# Patient Record
Sex: Male | Born: 1935 | Race: White | Hispanic: No | Marital: Married | Smoking: Former smoker
Health system: Southern US, Community
[De-identification: ages and names within clinical notes are randomized; demographics above are authoritative.]

## PROBLEM LIST (undated history)

## (undated) DIAGNOSIS — K219 Gastro-esophageal reflux disease without esophagitis: Secondary | ICD-10-CM

## (undated) DIAGNOSIS — E119 Type 2 diabetes mellitus without complications: Secondary | ICD-10-CM

## (undated) DIAGNOSIS — C3490 Malignant neoplasm of unspecified part of unspecified bronchus or lung: Secondary | ICD-10-CM

## (undated) DIAGNOSIS — I10 Essential (primary) hypertension: Secondary | ICD-10-CM

## (undated) DIAGNOSIS — C3491 Malignant neoplasm of unspecified part of right bronchus or lung: Principal | ICD-10-CM

## (undated) DIAGNOSIS — M4000 Postural kyphosis, site unspecified: Secondary | ICD-10-CM

## (undated) DIAGNOSIS — Z789 Other specified health status: Secondary | ICD-10-CM

## (undated) HISTORY — DX: Postural kyphosis, site unspecified: M40.00

## (undated) HISTORY — DX: Type 2 diabetes mellitus without complications: E11.9

## (undated) HISTORY — DX: Gastro-esophageal reflux disease without esophagitis: K21.9

## (undated) HISTORY — DX: Essential (primary) hypertension: I10

## (undated) HISTORY — DX: Malignant neoplasm of unspecified part of right bronchus or lung: C34.91

---

## 1997-12-26 ENCOUNTER — Other Ambulatory Visit: Admission: RE | Admit: 1997-12-26 | Discharge: 1997-12-26 | Payer: Self-pay | Admitting: *Deleted

## 1998-01-01 ENCOUNTER — Ambulatory Visit (HOSPITAL_COMMUNITY): Admission: RE | Admit: 1998-01-01 | Discharge: 1998-01-01 | Payer: Self-pay | Admitting: *Deleted

## 1998-01-21 ENCOUNTER — Ambulatory Visit (HOSPITAL_BASED_OUTPATIENT_CLINIC_OR_DEPARTMENT_OTHER): Admission: RE | Admit: 1998-01-21 | Discharge: 1998-01-21 | Payer: Self-pay | Admitting: *Deleted

## 1998-02-18 ENCOUNTER — Ambulatory Visit (HOSPITAL_COMMUNITY): Admission: RE | Admit: 1998-02-18 | Discharge: 1998-02-18 | Payer: Self-pay | Admitting: Hematology and Oncology

## 1998-02-19 ENCOUNTER — Ambulatory Visit (HOSPITAL_COMMUNITY): Admission: RE | Admit: 1998-02-19 | Discharge: 1998-02-19 | Payer: Self-pay | Admitting: Hematology and Oncology

## 1998-02-25 ENCOUNTER — Ambulatory Visit (HOSPITAL_COMMUNITY): Admission: RE | Admit: 1998-02-25 | Discharge: 1998-02-25 | Payer: Self-pay | Admitting: Hematology and Oncology

## 1998-06-20 ENCOUNTER — Encounter: Payer: Self-pay | Admitting: Hematology and Oncology

## 1998-06-20 ENCOUNTER — Ambulatory Visit (HOSPITAL_COMMUNITY): Admission: RE | Admit: 1998-06-20 | Discharge: 1998-06-20 | Payer: Self-pay | Admitting: Hematology and Oncology

## 1998-09-12 ENCOUNTER — Ambulatory Visit (HOSPITAL_COMMUNITY): Admission: RE | Admit: 1998-09-12 | Discharge: 1998-09-12 | Payer: Self-pay | Admitting: Radiation Oncology

## 1999-03-13 ENCOUNTER — Encounter: Payer: Self-pay | Admitting: Hematology and Oncology

## 1999-03-13 ENCOUNTER — Ambulatory Visit (HOSPITAL_COMMUNITY): Admission: RE | Admit: 1999-03-13 | Discharge: 1999-03-13 | Payer: Self-pay | Admitting: Hematology and Oncology

## 1999-12-07 ENCOUNTER — Encounter: Payer: Self-pay | Admitting: *Deleted

## 1999-12-07 ENCOUNTER — Ambulatory Visit (HOSPITAL_COMMUNITY): Admission: RE | Admit: 1999-12-07 | Discharge: 1999-12-07 | Payer: Self-pay | Admitting: *Deleted

## 2000-09-23 ENCOUNTER — Ambulatory Visit (HOSPITAL_COMMUNITY): Admission: RE | Admit: 2000-09-23 | Discharge: 2000-09-23 | Payer: Self-pay | Admitting: Gastroenterology

## 2001-02-09 ENCOUNTER — Ambulatory Visit (HOSPITAL_COMMUNITY): Admission: RE | Admit: 2001-02-09 | Discharge: 2001-02-09 | Payer: Self-pay | Admitting: *Deleted

## 2001-02-09 ENCOUNTER — Encounter: Payer: Self-pay | Admitting: *Deleted

## 2001-06-21 ENCOUNTER — Encounter: Payer: Self-pay | Admitting: *Deleted

## 2001-06-21 ENCOUNTER — Ambulatory Visit (HOSPITAL_COMMUNITY): Admission: RE | Admit: 2001-06-21 | Discharge: 2001-06-21 | Payer: Self-pay | Admitting: *Deleted

## 2001-06-22 ENCOUNTER — Ambulatory Visit (HOSPITAL_COMMUNITY): Admission: RE | Admit: 2001-06-22 | Discharge: 2001-06-22 | Payer: Self-pay | Admitting: *Deleted

## 2001-06-22 ENCOUNTER — Encounter: Payer: Self-pay | Admitting: *Deleted

## 2001-11-06 ENCOUNTER — Encounter: Payer: Self-pay | Admitting: *Deleted

## 2001-11-06 ENCOUNTER — Ambulatory Visit (HOSPITAL_COMMUNITY): Admission: RE | Admit: 2001-11-06 | Discharge: 2001-11-06 | Payer: Self-pay | Admitting: *Deleted

## 2001-11-06 ENCOUNTER — Encounter (INDEPENDENT_AMBULATORY_CARE_PROVIDER_SITE_OTHER): Payer: Self-pay

## 2002-08-22 ENCOUNTER — Encounter: Payer: Self-pay | Admitting: *Deleted

## 2002-08-22 ENCOUNTER — Ambulatory Visit (HOSPITAL_COMMUNITY): Admission: RE | Admit: 2002-08-22 | Discharge: 2002-08-22 | Payer: Self-pay | Admitting: *Deleted

## 2007-06-12 ENCOUNTER — Ambulatory Visit: Payer: Self-pay | Admitting: Family Medicine

## 2008-04-03 ENCOUNTER — Ambulatory Visit: Payer: Self-pay | Admitting: Family Medicine

## 2010-11-27 NOTE — Procedures (Signed)
[place]  Patient:    Alexander Landry, Alexander Landry                   MRN: [id number] Proc. Date: 09/23/00 Adm. Date:  [id number] Attending:  Nelda Marseille CC:         Ronnald Nian, M.D.   Procedure Report  PROCEDURE:  Colonoscopy and polypectomy.  INDICATION:  Patient overdue for colonic screening.  Consent was signed after risks, benefits, methods, and options thoroughly discussed in the office.  MEDICATIONS:  Demerol 50 mg, Versed 8 mg.  DESCRIPTION OF PROCEDURE:  Rectal inspection was pertinent for external hemorrhoids.  Digital exam was negative.  The colonoscope was inserted, and he had an extremely difficult sigmoid turn, which to advance around required rolling him on his back and some abdominal pressures.  Once past that area, we were easily able to advance to the cecum.  On insertion, no significant abnormalities were seen.  The cecum was identified by the appendiceal orifice and the ileocecal valve.  There was a 4 mm erythematous polyp in the cecum, which was carefully snagged with electrocautery at a setting of 20-20, carefully applied, and the polyp was removed, sucked through the endoscope, and collected in the trap.  There was no active bleeding and nice, white coagulum in the cecum.  The scope was then slowly withdrawn.  The prep was adequate.  There was some liquid stool that required washing and suctioning. On slow withdrawal through the colon, the ascending and transverse and descending were normal.  The sigmoid was tortuous and when we withdrew through the tortuous area, he had some discomfort.  Back distal to that in the distal sigmoid and rectum, three tiny small polyps were seen and were hot biopsied x 1 or 2.  The scope was retroflexed, pertinent for some internal hemorrhoids. The scope was straightened, air withdrawn, and the scope removed.  The patient tolerated the procedure adequately except for when we advanced  around the sigmoid.  There was no obvious immediate complication.  ENDOSCOPIC DIAGNOSES: 1. Internal-external hemorrhoids. 2. Tortuous sigmoid. 3. Three rectal and distal sigmoid polyps, status post hot biopsy. 4. Cecal 4 mm erythematous polyp, status post snare. 5. Otherwise within normal limits to the cecum.  PLAN:  Await pathology to determine future colonic screening, standard postpolypectomy instructions.  GI follow-up p.r.n., otherwise return care to Dr. Susann Givens for the customary health maintenance to include yearly rectals and guaiacs. DD:  09/23/00 TD:  09/23/00 Job: [id number]

## 2013-03-29 ENCOUNTER — Encounter: Payer: Self-pay | Admitting: Family Medicine

## 2013-03-29 ENCOUNTER — Ambulatory Visit (INDEPENDENT_AMBULATORY_CARE_PROVIDER_SITE_OTHER): Payer: Medicare Other | Admitting: Family Medicine

## 2013-03-29 VITALS — BP 120/70 | HR 68 | Ht 72.0 in | Wt 220.0 lb

## 2013-03-29 DIAGNOSIS — I10 Essential (primary) hypertension: Secondary | ICD-10-CM

## 2013-03-29 DIAGNOSIS — Z79899 Other long term (current) drug therapy: Secondary | ICD-10-CM

## 2013-03-29 DIAGNOSIS — Z87442 Personal history of urinary calculi: Secondary | ICD-10-CM | POA: Insufficient documentation

## 2013-03-29 DIAGNOSIS — Z8571 Personal history of Hodgkin lymphoma: Secondary | ICD-10-CM

## 2013-03-29 DIAGNOSIS — E118 Type 2 diabetes mellitus with unspecified complications: Secondary | ICD-10-CM | POA: Insufficient documentation

## 2013-03-29 DIAGNOSIS — E119 Type 2 diabetes mellitus without complications: Secondary | ICD-10-CM

## 2013-03-29 DIAGNOSIS — E1169 Type 2 diabetes mellitus with other specified complication: Secondary | ICD-10-CM

## 2013-03-29 DIAGNOSIS — Z23 Encounter for immunization: Secondary | ICD-10-CM

## 2013-03-29 DIAGNOSIS — E1159 Type 2 diabetes mellitus with other circulatory complications: Secondary | ICD-10-CM | POA: Insufficient documentation

## 2013-03-29 DIAGNOSIS — E785 Hyperlipidemia, unspecified: Secondary | ICD-10-CM | POA: Insufficient documentation

## 2013-03-29 NOTE — Patient Instructions (Signed)
Stop the Glucotrol

## 2013-03-29 NOTE — Progress Notes (Signed)
  Subjective:    Patient ID: Alexander Landry, male    DOB: 11/28/1932, 79 y.o.   MRN: [id number]  HPI He is here to get reestablished with my practice. He was here several years ago. He has a previous history of Hodgkin's lymphoma but is now several years past this. He does have underlying diabetes, hypertension, hyperlipidemia. He was seeing a physician near his home town but switched back to me. He has no particular concerns or questions. Does have a previous history of renal stones as well. Review of Systems     Objective:   Physical Exam Alert and in no distress otherwise not examined. Hemoglobin A1c is 6.1.       Assessment & Plan:  Type II or unspecified type diabetes mellitus without mention of complication, not stated as uncontrolled  Hypertension associated with diabetes  Hyperlipidemia LDL goal < 70  History of hodgkin's lymphoma  History of renal stone  Need for prophylactic vaccination and inoculation against influenza - Plan: Flu Vaccine QUAD 36+ mos IM  Encounter for long-term (current) use of other medications    I will get his medical records and review them as well as the blood work. Did recommend that he cut out taking the Glucotrol. He has not had any hypoglycemic episodes however still like to stop this medication. He was also given a flu shot with risks and benefits discussed

## 2013-04-02 ENCOUNTER — Encounter: Payer: Self-pay | Admitting: Family Medicine

## 2013-05-02 ENCOUNTER — Telehealth: Payer: Self-pay | Admitting: Family Medicine

## 2013-05-02 ENCOUNTER — Other Ambulatory Visit: Payer: Self-pay

## 2013-05-02 MED ORDER — OMEPRAZOLE 20 MG PO CPDR: 20.0000 mg | DELAYED_RELEASE_CAPSULE | Freq: Every day | ORAL | Status: DC

## 2013-05-02 NOTE — Telephone Encounter (Signed)
SENT MED IN 

## 2013-05-02 NOTE — Telephone Encounter (Signed)
DONE

## 2013-06-28 ENCOUNTER — Ambulatory Visit (INDEPENDENT_AMBULATORY_CARE_PROVIDER_SITE_OTHER): Payer: Medicare Other | Admitting: Family Medicine

## 2013-06-28 ENCOUNTER — Encounter: Payer: Self-pay | Admitting: Family Medicine

## 2013-06-28 VITALS — BP 116/70 | HR 70 | Wt 220.0 lb

## 2013-06-28 DIAGNOSIS — E1169 Type 2 diabetes mellitus with other specified complication: Secondary | ICD-10-CM

## 2013-06-28 DIAGNOSIS — E1159 Type 2 diabetes mellitus with other circulatory complications: Secondary | ICD-10-CM

## 2013-06-28 DIAGNOSIS — E119 Type 2 diabetes mellitus without complications: Secondary | ICD-10-CM

## 2013-06-28 DIAGNOSIS — I10 Essential (primary) hypertension: Secondary | ICD-10-CM

## 2013-06-28 DIAGNOSIS — Z8571 Personal history of Hodgkin lymphoma: Secondary | ICD-10-CM

## 2013-06-28 DIAGNOSIS — E785 Hyperlipidemia, unspecified: Secondary | ICD-10-CM

## 2013-06-28 LAB — POCT GLYCOSYLATED HEMOGLOBIN (HGB A1C): Hemoglobin A1C: 6.6

## 2013-06-28 NOTE — Progress Notes (Signed)
  Subjective:    Alexander Landry is a 79 y.o. male who presents for follow-up of Type 2 diabetes mellitus.    Home blood sugar records: 2 TIMES A MTH  Current symptoms/problems include NO Daily foot checks: Any foot concerns NONE Last eye exam:  3/15   Medication compliance:good Current diet: NONE Current exercise: WALKING/GYM    Known diabetic complications: none Cardiovascular risk factors: advanced age (older than 34 for men, 102 for women), diabetes mellitus, dyslipidemia, hypertension and male gender   The following portions of the patient's history were reviewed and updated as appropriate: allergies, current medications, past family history, past medical history, past social history, past surgical history and problem list. He does have a previous history of Hodgkin's lymphoma. He also had hip fracture with subsequent pinning done earlier this year. ROS as in subjective above    Objective:    BP 116/70  Wt 220 lb (99.791 kg)  Filed Vitals:   06/28/13 1006  BP: 116/70    General appearence: alert, no distress, WD/WN   Lab Review No results found for this basename: HGBA1C   No results found for this basename: MICROALBUR, [id number]     Chemistry   No results found for this basename: NA, K, CL, CO2, BUN, CREATININE, GLU   No results found for this basename: CALCIUM, ALKPHOS, AST, ALT, BILITOT        Chemistry   No results found for this basename: NA, K, CL, CO2, BUN, CREATININE, GLU   No results found for this basename: CALCIUM, ALKPHOS, AST, ALT, BILITOT    His last blood work was done by his previous doctor in March of last year. Hemoglobin A1c is 6.6.    Assessment:   Encounter Diagnoses  Name Primary?  . Type II or unspecified type diabetes mellitus without mention of complication, not stated as uncontrolled Yes  . Hypertension associated with diabetes   . Hyperlipidemia LDL goal < 70   . History of hodgkin's lymphoma          Plan:    1.   Rx changes: none 2.  Education: Reviewed 'ABCs' of diabetes management (respective goals in parentheses):  A1C (<7), blood pressure (<130/80), and cholesterol (LDL <100). 3.  Compliance at present is estimated to be good. Efforts to improve compliance (if necessary) will be directed at none. 4. Follow up: 4 months  Overall he is doing quite well with his diabetes. Discussed the need for him to periodically check his blood sugars and if they start to become elevated, further intervention will be needed.

## 2013-07-10 ENCOUNTER — Telehealth: Payer: Self-pay | Admitting: Internal Medicine

## 2013-07-10 ENCOUNTER — Other Ambulatory Visit: Payer: Self-pay

## 2013-07-10 MED ORDER — PIOGLITAZONE HCL 15 MG PO TABS: 15.0000 mg | ORAL_TABLET | Freq: Every day | ORAL | Status: DC

## 2013-07-10 NOTE — Telephone Encounter (Signed)
DONE

## 2013-07-10 NOTE — Telephone Encounter (Signed)
SENT IN ACTOS PER TELEPHONE CALL

## 2013-07-10 NOTE — Telephone Encounter (Signed)
Refill request for actos 15mg  to [facility]

## 2013-07-13 ENCOUNTER — Telehealth: Payer: Self-pay | Admitting: Internal Medicine

## 2013-07-13 MED ORDER — SIMVASTATIN 20 MG PO TABS: 20.0000 mg | ORAL_TABLET | Freq: Every evening | ORAL | Status: DC

## 2013-07-13 NOTE — Telephone Encounter (Signed)
Refill request for zocor 20mg  #30 to [facility] [address]

## 2013-07-19 ENCOUNTER — Telehealth: Payer: Self-pay | Admitting: Internal Medicine

## 2013-07-19 MED ORDER — AMLODIPINE BESYLATE 10 MG PO TABS: 10.0000 mg | ORAL_TABLET | Freq: Every day | ORAL | Status: DC

## 2013-07-19 NOTE — Telephone Encounter (Signed)
Refill request for norvasc 10mg  to [facility] [address]

## 2013-07-24 ENCOUNTER — Telehealth: Payer: Self-pay | Admitting: Internal Medicine

## 2013-07-24 NOTE — Telephone Encounter (Signed)
Refill request for Glipizide ER 10mg  #60, lisinopril 40mg  to [facility] [address]

## 2013-07-26 ENCOUNTER — Telehealth: Payer: Self-pay | Admitting: Internal Medicine

## 2013-07-26 MED ORDER — GLIPIZIDE ER 10 MG PO TB24: 10.0000 mg | ORAL_TABLET | Freq: Two times a day (BID) | ORAL | Status: DC

## 2013-07-26 MED ORDER — LISINOPRIL 40 MG PO TABS: 40.0000 mg | ORAL_TABLET | Freq: Every day | ORAL | Status: DC

## 2013-07-26 MED ORDER — GLIPIZIDE ER 10 MG PO TB24: 10.0000 mg | ORAL_TABLET | Freq: Every day | ORAL | Status: DC

## 2013-07-26 NOTE — Telephone Encounter (Signed)
Rx sent to pharm. Pt aware

## 2013-07-26 NOTE — Telephone Encounter (Signed)
Pharmacy needs rx for glipizide for 2 times daily

## 2013-07-30 ENCOUNTER — Telehealth: Payer: Self-pay | Admitting: Family Medicine

## 2013-07-30 MED ORDER — PIOGLITAZONE HCL 30 MG PO TABS: 30.0000 mg | ORAL_TABLET | Freq: Every day | ORAL | Status: DC

## 2013-07-30 NOTE — Telephone Encounter (Signed)
No answer

## 2013-07-30 NOTE — Telephone Encounter (Signed)
Let him know that I called him to 30 mg which should be quite adequate

## 2013-07-30 NOTE — Telephone Encounter (Signed)
Patient will be switched to Actos 30

## 2013-07-31 NOTE — Telephone Encounter (Signed)
Spoke with pt wife

## 2013-08-02 ENCOUNTER — Telehealth: Payer: Self-pay | Admitting: Internal Medicine

## 2013-08-02 MED ORDER — BD SWAB SINGLE USE REGULAR PADS: MEDICATED_PAD | Status: DC

## 2013-08-02 MED ORDER — GLUCOSE BLOOD VI STRP: ORAL_STRIP | Status: DC

## 2013-08-02 MED ORDER — ACCU-CHEK SMARTVIEW CONTROL VI LIQD: Status: DC

## 2013-08-02 MED ORDER — ACCU-CHEK NANO SMARTVIEW W/DEVICE KIT: PACK | Status: DC

## 2013-08-02 MED ORDER — ACCU-CHEK SOFTCLIX LANCET DEV MISC: Status: DC

## 2013-08-02 NOTE — Telephone Encounter (Signed)
done

## 2013-08-02 NOTE — Telephone Encounter (Signed)
Rx sent into [facility]

## 2013-08-02 NOTE — Telephone Encounter (Signed)
Refill request from accu-chek fastclix lancets, accu-chek smartview control sol, BD single use swab, accu-chek nano smartview meter, omeprazole, accu-chek smartview test strips to [facility]

## 2013-08-10 ENCOUNTER — Ambulatory Visit (INDEPENDENT_AMBULATORY_CARE_PROVIDER_SITE_OTHER): Payer: Medicare HMO | Admitting: Medical

## 2013-08-10 ENCOUNTER — Encounter: Payer: Self-pay | Admitting: Medical

## 2013-08-10 ENCOUNTER — Other Ambulatory Visit: Payer: Self-pay | Admitting: Family Medicine

## 2013-08-10 VITALS — BP 120/80 | HR 56 | Temp 97.8°F | Resp 16 | Wt 221.0 lb

## 2013-08-10 DIAGNOSIS — E119 Type 2 diabetes mellitus without complications: Secondary | ICD-10-CM

## 2013-08-10 DIAGNOSIS — J069 Acute upper respiratory infection, unspecified: Secondary | ICD-10-CM

## 2013-08-10 MED ORDER — AMLODIPINE BESYLATE 10 MG PO TABS: 10.0000 mg | ORAL_TABLET | Freq: Every day | ORAL | Status: DC

## 2013-08-10 MED ORDER — ATENOLOL 100 MG PO TABS: 100.0000 mg | ORAL_TABLET | Freq: Every day | ORAL | Status: DC

## 2013-08-10 MED ORDER — PIOGLITAZONE HCL 30 MG PO TABS: 15.0000 mg | ORAL_TABLET | Freq: Every day | ORAL | Status: DC

## 2013-08-10 MED ORDER — SIMVASTATIN 20 MG PO TABS: 20.0000 mg | ORAL_TABLET | Freq: Every evening | ORAL | Status: DC

## 2013-08-10 MED ORDER — OMEPRAZOLE 20 MG PO CPDR: 20.0000 mg | DELAYED_RELEASE_CAPSULE | Freq: Every day | ORAL | Status: DC

## 2013-08-10 MED ORDER — AZITHROMYCIN 500 MG PO TABS: 500.0000 mg | ORAL_TABLET | Freq: Every day | ORAL | Status: DC

## 2013-08-10 MED ORDER — GLIPIZIDE ER 10 MG PO TB24: 10.0000 mg | ORAL_TABLET | Freq: Two times a day (BID) | ORAL | Status: DC

## 2013-08-10 NOTE — Patient Instructions (Signed)
Your symptoms and exam suggest a viral upper respiratory infection or cold.  I recommend you drink plenty of water, you can continue the DayQuil/NyQuil for the next few days, rest.  Limit the Afrin nasal spray to 4 days or less.  Usually takes about a week for cold to resolve  If much worse over the weekend with fever, consistent colored mucus, feeling way worse, then begin the a azithromycin antibiotic.  Followup with Dr. Redmond School day for your routine medication check

## 2013-08-10 NOTE — Progress Notes (Signed)
Subjective:  Alexander Landry is a 80 y.o. male who presents cough and congestion.  Here for congestion. He notes several days of cough, malaise, some head pressure.  Denies sore throat, fever, ear pain, no NVD, no sick contacts.  Using some Nyquil, and OTC Afrin.  No other aggravating or relieving factors.  No other c/o.  He is changing to a 90 day mail order pharmacy, needs a 90 day supply of all his medications  ROS as in subjective.  History reviewed. No pertinent past medical history.   Objective: Filed Vitals:   08/10/13 1144  BP: 120/80  Pulse: 56  Temp: 97.8 F (36.6 C)  Resp: 16    General appearance: Alert, WD/WN, no distress, mildly ill appearing                             Skin: warm, no rash                           Head: no sinus tenderness                            Eyes: conjunctiva normal, corneas clear, PERRLA                            Ears: pearly TMs, external ear canals normal                          Nose: septum midline, turbinates swollen, with erythema and clear discharge             Mouth/throat: MMM, tongue normal, mild pharyngeal erythema                           Neck: supple, no adenopathy, no thyromegaly, nontender                          Heart: RRR, normal S1, S2, no murmurs                         Lungs: CTA bilaterally, no wheezes, rales, or rhonchi     Assessment: Encounter Diagnoses  Name Primary?  Marland Kitchen Upper respiratory infection Yes  . Type II or unspecified type diabetes mellitus without mention of complication, not stated as uncontrolled     Plan: Discussed diagnosis and treatment of URI.  Suggested symptomatic OTC remedies.  Nasal saline spray for congestion.  Tylenol OTC for fever and malaise.  Call/return in 2-3 days if symptoms aren't resolving. Gave printed Zithromax script in the event of fever, worsening productive mucous, but advised that current picture is viral!  Sent 30 day supply on Atenolol and Prilosec, 90 day to [facility], and advised he f/u with Dr. Redmond School soon for routine diabetes f/u.

## 2013-09-24 ENCOUNTER — Telehealth: Payer: Self-pay | Admitting: Internal Medicine

## 2013-09-24 NOTE — Telephone Encounter (Signed)
I spoke with the patients wife and she states that they are unsure if he is suppose to be take the medication or not so they will just keep the bottles until he comes in for his appointment to see Dr. Redmond School in April and ask him. CLS

## 2013-09-24 NOTE — Telephone Encounter (Signed)
He normally sees Dr. Redmond School here.  His last visit because he was changing mail order pharmacies he asked for a refill, I refilled all active medications in chart, and I can't tell if amlodipine was ever stopped recently.  Thus, I assumed he is still taking it.  If he had not been taking it, please document this in chart, and f/u as planned with Dr. Redmond School as scheduled.

## 2013-09-24 NOTE — Telephone Encounter (Signed)
Pt wife called stating that they just received amlodipine in the mail from [facility] and pt states he is not on this med. Is he suppose to be taking this med?

## 2013-10-11 LAB — HM DIABETES EYE EXAM

## 2013-10-15 ENCOUNTER — Other Ambulatory Visit: Payer: Self-pay | Admitting: Medical

## 2013-10-15 ENCOUNTER — Encounter: Payer: Self-pay | Admitting: Internal Medicine

## 2013-10-30 ENCOUNTER — Ambulatory Visit (INDEPENDENT_AMBULATORY_CARE_PROVIDER_SITE_OTHER): Payer: Medicare HMO | Admitting: Family Medicine

## 2013-10-30 ENCOUNTER — Encounter: Payer: Self-pay | Admitting: Family Medicine

## 2013-10-30 VITALS — BP 130/70 | HR 72 | Wt 220.0 lb

## 2013-10-30 DIAGNOSIS — E1169 Type 2 diabetes mellitus with other specified complication: Secondary | ICD-10-CM

## 2013-10-30 DIAGNOSIS — E785 Hyperlipidemia, unspecified: Secondary | ICD-10-CM

## 2013-10-30 DIAGNOSIS — I10 Essential (primary) hypertension: Secondary | ICD-10-CM

## 2013-10-30 DIAGNOSIS — E1159 Type 2 diabetes mellitus with other circulatory complications: Secondary | ICD-10-CM

## 2013-10-30 DIAGNOSIS — E119 Type 2 diabetes mellitus without complications: Secondary | ICD-10-CM

## 2013-10-30 DIAGNOSIS — Z8571 Personal history of Hodgkin lymphoma: Secondary | ICD-10-CM

## 2013-10-30 LAB — COMPREHENSIVE METABOLIC PANEL
ALBUMIN: 4.2 g/dL (ref 3.5–5.2)
ALT: 14 U/L (ref 0–53)
AST: 9 U/L (ref 0–37)
Alkaline Phosphatase: 71 U/L (ref 39–117)
BUN: 15 mg/dL (ref 6–23)
CO2: 24 meq/L (ref 19–32)
Calcium: 9.5 mg/dL (ref 8.4–10.5)
Chloride: 104 mEq/L (ref 96–112)
Creat: 0.98 mg/dL (ref 0.50–1.35)
GLUCOSE: 139 mg/dL — AB (ref 70–99)
POTASSIUM: 3.9 meq/L (ref 3.5–5.3)
SODIUM: 139 meq/L (ref 135–145)
TOTAL PROTEIN: 7 g/dL (ref 6.0–8.3)
Total Bilirubin: 0.9 mg/dL (ref 0.2–1.2)

## 2013-10-30 LAB — LIPID PANEL
CHOLESTEROL: 119 mg/dL (ref 0–200)
HDL: 37 mg/dL — ABNORMAL LOW (ref >39)
LDL Cholesterol: 67 mg/dL (ref 0–99)
TRIGLYCERIDES: 75 mg/dL (ref <150)
Total CHOL/HDL Ratio: 3.2 Ratio
VLDL: 15 mg/dL (ref 0–40)

## 2013-10-30 LAB — CBC WITH DIFFERENTIAL/PLATELET
Basophils Absolute: 0.2 10*3/uL — ABNORMAL HIGH (ref 0.0–0.1)
Basophils Relative: 2 % — ABNORMAL HIGH (ref 0–1)
EOS ABS: 0.4 10*3/uL (ref 0.0–0.7)
Eosinophils Relative: 4 % (ref 0–5)
HCT: 42.9 % (ref 39.0–52.0)
HEMOGLOBIN: 14.9 g/dL (ref 13.0–17.0)
Lymphocytes Relative: 36 % (ref 12–46)
Lymphs Abs: 3.4 10*3/uL (ref 0.7–4.0)
MCH: 30.8 pg (ref 26.0–34.0)
MCHC: 34.7 g/dL (ref 30.0–36.0)
MCV: 88.8 fL (ref 78.0–100.0)
MONOS PCT: 10 % (ref 3–12)
Monocytes Absolute: 1 10*3/uL (ref 0.1–1.0)
NEUTROS ABS: 4.6 10*3/uL (ref 1.7–7.7)
NEUTROS PCT: 48 % (ref 43–77)
Platelets: 275 10*3/uL (ref 150–400)
RBC: 4.83 MIL/uL (ref 4.22–5.81)
RDW: 14.6 % (ref 11.5–15.5)
WBC: 9.5 10*3/uL (ref 4.0–10.5)

## 2013-10-30 LAB — POCT GLYCOSYLATED HEMOGLOBIN (HGB A1C): HEMOGLOBIN A1C: 6.2

## 2013-10-30 MED ORDER — METFORMIN HCL 500 MG PO TABS: 500.0000 mg | ORAL_TABLET | Freq: Every day | ORAL | Status: DC

## 2013-10-30 MED ORDER — GLUCOSE BLOOD VI STRP: ORAL_STRIP | Status: DC

## 2013-10-30 NOTE — Patient Instructions (Signed)
Stop the glipizide. This is the bottle that was thrown away in the office. We will give you a new medicine today called Metformin. If this new medicine is expensive, please call the office and we will work on it. You will take this medication 1 time a day. Please come back in 4 months for follow up.

## 2013-10-30 NOTE — Progress Notes (Signed)
   Subjective:    Patient ID: Alexander Landry, male    DOB: 1932-12-10, 80 y.o.   MRN: [id number]  HPI Mr. ABRAR BILTON is a very pleasant 80 y.o. yo male with PMH significant for diabetes, hypertension, hyperlipidemia and history of hodgkin's lymphoma. He presents today for 4 month f/u of his diabetes.  The patient is doing well overall and has no acute complaints today. The patient checks his blood sugars once a month or less because he has a new meter but doesn't have test strips. , 130 when he wakes up. 139 this am. New meter with no test strips. Accu check Nano. He was supposed to stop glipizide however he has continued on that medication. - diet: tries to eat healthy - exercise: gym where he lives. 3 days a week, 1 mile on the treadmill and three miles on the stationary bike.  - meds - Tobacco: quit 5 years ago - Alcohol: no - eyes: 09/29/2013 - feet: no burning or tingling  No hearing trouble.   Doesn't want to see a podiatrist.  Review of Systems is negative except per HPI.    Objective:   Physical Exam  Constitutional: Patient is oriented to person, place, and time and well-developed, well-nourished, and in no distress. Eyes: Conjunctivae and EOM are normal. Pupils are equal, round, and reactive to light.  Cardiovascular: Normal rate, regular rhythm and intact distal pulses. Pulmonary/Chest: Effort normal and breath sounds normal.  Neurological: Monofilament sensation normal in feet bilaterally. Hemoglobin A1c 6.2    Assessment & Plan:  Type II or unspecified type diabetes mellitus without mention of complication, not stated as uncontrolled - Plan: HgB A1c, glucose blood test strip, metFORMIN (GLUCOPHAGE) 500 MG tablet, CBC with Differential, Comprehensive metabolic panel, Lipid panel  Hypertension associated with diabetes - Plan: CBC with Differential, Comprehensive metabolic panel  Hyperlipidemia LDL goal < 70 - Plan: Lipid panel  History of hodgkin's  lymphoma  reinforced the need for him to stop his glipizide. I will place him on metformin 500 mg daily.

## 2013-10-30 NOTE — Progress Notes (Deleted)
   Subjective:    Patient ID: Alexander Landry, male    DOB: Feb 26, 1933, 80 y.o.   MRN: [id number]  HPI    Review of Systems     Objective:   Physical Exam        Assessment & Plan:

## 2013-11-26 ENCOUNTER — Telehealth: Payer: Self-pay | Admitting: Family Medicine

## 2013-11-26 MED ORDER — LISINOPRIL 40 MG PO TABS: 40.0000 mg | ORAL_TABLET | Freq: Every day | ORAL | Status: DC

## 2013-11-26 NOTE — Telephone Encounter (Signed)
Pt needs refill on lisinopril sent to [facility]. [facility].

## 2013-11-26 NOTE — Telephone Encounter (Signed)
Medication sent in. 

## 2013-12-26 ENCOUNTER — Telehealth: Payer: Self-pay | Admitting: Family Medicine

## 2013-12-26 DIAGNOSIS — E119 Type 2 diabetes mellitus without complications: Secondary | ICD-10-CM

## 2013-12-26 MED ORDER — METFORMIN HCL 500 MG PO TABS: 500.0000 mg | ORAL_TABLET | Freq: Every day | ORAL | Status: DC

## 2013-12-26 NOTE — Telephone Encounter (Signed)
Pt wife called for Metformin to be refilled to [facility].  Pt already had refills, so just switched to [facility].

## 2014-01-21 ENCOUNTER — Telehealth: Payer: Self-pay | Admitting: Family Medicine

## 2014-01-21 ENCOUNTER — Other Ambulatory Visit: Payer: Self-pay | Admitting: Medical

## 2014-01-21 NOTE — Telephone Encounter (Signed)
pls send refill 90 day to mail order

## 2014-01-21 NOTE — Telephone Encounter (Signed)
Needs refill on amlodopine  Sent to [facility]

## 2014-01-22 MED ORDER — AMLODIPINE BESYLATE 10 MG PO TABS: ORAL_TABLET | ORAL | Status: DC

## 2014-01-22 NOTE — Telephone Encounter (Signed)
done

## 2014-02-06 ENCOUNTER — Other Ambulatory Visit: Payer: Self-pay

## 2014-02-06 ENCOUNTER — Telehealth: Payer: Self-pay | Admitting: Family Medicine

## 2014-02-06 MED ORDER — SIMVASTATIN 20 MG PO TABS: 20.0000 mg | ORAL_TABLET | Freq: Every day | ORAL | Status: DC

## 2014-02-06 MED ORDER — ATENOLOL 100 MG PO TABS: 100.0000 mg | ORAL_TABLET | Freq: Every day | ORAL | Status: DC

## 2014-02-06 MED ORDER — PIOGLITAZONE HCL 30 MG PO TABS: 15.0000 mg | ORAL_TABLET | Freq: Every day | ORAL | Status: DC

## 2014-02-06 NOTE — Telephone Encounter (Signed)
done

## 2014-03-04 ENCOUNTER — Ambulatory Visit (INDEPENDENT_AMBULATORY_CARE_PROVIDER_SITE_OTHER): Payer: Medicare HMO | Admitting: Family Medicine

## 2014-03-04 ENCOUNTER — Encounter: Payer: Self-pay | Admitting: Family Medicine

## 2014-03-04 VITALS — BP 130/80 | HR 70 | Wt 222.0 lb

## 2014-03-04 DIAGNOSIS — E785 Hyperlipidemia, unspecified: Secondary | ICD-10-CM

## 2014-03-04 DIAGNOSIS — E119 Type 2 diabetes mellitus without complications: Secondary | ICD-10-CM

## 2014-03-04 DIAGNOSIS — E1169 Type 2 diabetes mellitus with other specified complication: Secondary | ICD-10-CM

## 2014-03-04 DIAGNOSIS — K219 Gastro-esophageal reflux disease without esophagitis: Secondary | ICD-10-CM

## 2014-03-04 DIAGNOSIS — I10 Essential (primary) hypertension: Secondary | ICD-10-CM

## 2014-03-04 DIAGNOSIS — Z23 Encounter for immunization: Secondary | ICD-10-CM

## 2014-03-04 DIAGNOSIS — E1159 Type 2 diabetes mellitus with other circulatory complications: Secondary | ICD-10-CM

## 2014-03-04 LAB — POCT GLYCOSYLATED HEMOGLOBIN (HGB A1C): HEMOGLOBIN A1C: 7.9

## 2014-03-04 MED ORDER — PIOGLITAZONE HCL 30 MG PO TABS: ORAL_TABLET | ORAL | Status: DC

## 2014-03-04 MED ORDER — METFORMIN HCL 500 MG PO TABS: ORAL_TABLET | ORAL | Status: DC

## 2014-03-04 MED ORDER — OMEPRAZOLE 20 MG PO CPDR: DELAYED_RELEASE_CAPSULE | ORAL | Status: DC

## 2014-03-04 NOTE — Progress Notes (Signed)
  Subjective:    Alexander Landry is a 80 y.o. male who presents for follow-up of Type 2 diabetes mellitus.    Home blood sugar records: CHECKS B/S ONCE A WEEK usually in the morning. He notes that in the last month they have been in the 200-250 range.  Current symptoms/problems NONE Daily foot checks: Any foot concerns: NONE Last eye exam:  4/15 DR.GROAT   Medication compliance: Good Current diet: NONE Current exercise: WALKING 15 minutes daily. Known diabetic complications: none Cardiovascular risk factors: advanced age (older than 1 for men, 65 for women), diabetes mellitus, dyslipidemia, hypertension and male gender   The following portions of the patient's history were reviewed and updated as appropriate: allergies, current medications, past family history, past medical history, past social history and problem list.  ROS as in subjective above    Objective:   General appearence: alert, no distress, WD/WN  Lab Review Lab Results  Component Value Date   HGBA1C 6.2 10/30/2013   Lab Results  Component Value Date   CHOL 119 10/30/2013   HDL 37* 10/30/2013   LDLCALC 67 10/30/2013   TRIG 75 10/30/2013   CHOLHDL 3.2 10/30/2013   No results found for this basenameDerl Landry     Chemistry      Component Value Date/Time   NA 139 10/30/2013 0938   K 3.9 10/30/2013 0938   CL 104 10/30/2013 0938   CO2 24 10/30/2013 0938   BUN 15 10/30/2013 0938   CREATININE 0.98 10/30/2013 0938      Component Value Date/Time   CALCIUM 9.5 10/30/2013 0938   ALKPHOS 71 10/30/2013 0938   AST 9 10/30/2013 0938   ALT 14 10/30/2013 0938   BILITOT 0.9 10/30/2013 0938        Chemistry      Component Value Date/Time   NA 139 10/30/2013 0938   K 3.9 10/30/2013 0938   CL 104 10/30/2013 0938   CO2 24 10/30/2013 0938   BUN 15 10/30/2013 0938   CREATININE 0.98 10/30/2013 0938      Component Value Date/Time   CALCIUM 9.5 10/30/2013 0938   ALKPHOS 71 10/30/2013 0938   AST 9 10/30/2013 0938   ALT  14 10/30/2013 0938   BILITOT 0.9 10/30/2013 0938       Hemoglobin A1c 7.9  Assessment:  Type II or unspecified type diabetes mellitus without mention of complication, not stated as uncontrolled - Plan: POCT glycosylated hemoglobin (Hb A1C), pioglitazone (ACTOS) 30 MG tablet, metFORMIN (GLUCOPHAGE) 500 MG tablet  Hypertension associated with diabetes  Hyperlipidemia with target LDL less than 70  Gastroesophageal reflux disease without esophagitis - Plan: omeprazole (PRILOSEC) 20 MG capsule  Need for prophylactic vaccination against Streptococcus pneumoniae (pneumococcus) - Plan: Pneumococcal conjugate vaccine 13-valent        Plan:    1.  Rx changes: Increase Actos to 30 mg and increase metformin to 500 twice a day. 2.  Education: Reviewed 'ABCs' of diabetes management (respective goals in parentheses):  A1C (<7), blood pressure (<130/80), and cholesterol (LDL <100). 3.  Compliance at present is estimated to be good. Efforts to improve compliance (if necessary) will be directed at increased exercise to 30 minutes every day. 4. Follow up: 4 months  He is to monitor his blood sugars and if they stay in the 200 range in spite of increasing his medications after one month, he is to call me.

## 2014-03-04 NOTE — Patient Instructions (Signed)
Take the medications as directed on the label. If your blood sugars still stay in the 200 range after about a month of being on a new medicine let me know

## 2014-04-15 ENCOUNTER — Other Ambulatory Visit (INDEPENDENT_AMBULATORY_CARE_PROVIDER_SITE_OTHER): Payer: Medicare HMO

## 2014-04-15 DIAGNOSIS — Z23 Encounter for immunization: Secondary | ICD-10-CM

## 2014-04-22 ENCOUNTER — Other Ambulatory Visit: Payer: Self-pay

## 2014-04-22 ENCOUNTER — Telehealth: Payer: Self-pay | Admitting: Family Medicine

## 2014-04-22 DIAGNOSIS — E119 Type 2 diabetes mellitus without complications: Secondary | ICD-10-CM

## 2014-04-22 MED ORDER — AMLODIPINE BESYLATE 10 MG PO TABS: ORAL_TABLET | ORAL | Status: DC

## 2014-04-22 MED ORDER — GLUCOSE BLOOD VI STRP: ORAL_STRIP | Status: DC

## 2014-04-22 MED ORDER — ACCU-CHEK SOFTCLIX LANCET DEV MISC: Status: DC

## 2014-04-22 NOTE — Telephone Encounter (Signed)
DONE

## 2014-04-22 NOTE — Telephone Encounter (Signed)
Also requesting accu-chek fastclix lancets, accu-chek smartview control, BD single use swab

## 2014-05-03 ENCOUNTER — Telehealth: Payer: Self-pay | Admitting: Family Medicine

## 2014-05-03 ENCOUNTER — Other Ambulatory Visit: Payer: Self-pay | Admitting: Family Medicine

## 2014-05-03 MED ORDER — LISINOPRIL 40 MG PO TABS: 40.0000 mg | ORAL_TABLET | Freq: Every day | ORAL | Status: DC

## 2014-05-03 MED ORDER — SIMVASTATIN 20 MG PO TABS: 20.0000 mg | ORAL_TABLET | Freq: Every day | ORAL | Status: DC

## 2014-05-03 NOTE — Telephone Encounter (Signed)
Rx was sent to the pharmacy's for 90 days supply

## 2014-07-03 ENCOUNTER — Encounter: Payer: Self-pay | Admitting: Family Medicine

## 2014-07-03 ENCOUNTER — Telehealth: Payer: Self-pay | Admitting: Family Medicine

## 2014-07-03 ENCOUNTER — Ambulatory Visit (INDEPENDENT_AMBULATORY_CARE_PROVIDER_SITE_OTHER): Payer: Medicare HMO | Admitting: Family Medicine

## 2014-07-03 VITALS — BP 124/74 | HR 55 | Wt 226.0 lb

## 2014-07-03 DIAGNOSIS — E785 Hyperlipidemia, unspecified: Secondary | ICD-10-CM

## 2014-07-03 DIAGNOSIS — I10 Essential (primary) hypertension: Secondary | ICD-10-CM

## 2014-07-03 DIAGNOSIS — Z136 Encounter for screening for cardiovascular disorders: Secondary | ICD-10-CM

## 2014-07-03 DIAGNOSIS — E1169 Type 2 diabetes mellitus with other specified complication: Secondary | ICD-10-CM

## 2014-07-03 DIAGNOSIS — E1159 Type 2 diabetes mellitus with other circulatory complications: Secondary | ICD-10-CM

## 2014-07-03 DIAGNOSIS — E119 Type 2 diabetes mellitus without complications: Secondary | ICD-10-CM

## 2014-07-03 LAB — POCT URINALYSIS DIPSTICK
Bilirubin, UA: NEGATIVE
Blood, UA: NEGATIVE
Glucose, UA: NEGATIVE
Ketones, UA: NEGATIVE
Leukocytes, UA: NEGATIVE
Nitrite, UA: NEGATIVE
PH UA: 6
PROTEIN UA: NEGATIVE
Urobilinogen, UA: NEGATIVE

## 2014-07-03 LAB — POCT GLYCOSYLATED HEMOGLOBIN (HGB A1C): HEMOGLOBIN A1C: 6.8

## 2014-07-03 NOTE — Telephone Encounter (Signed)
Called pt advised of [facility]  appt Thurs 07/11/14 at 8:40 NO PO after midnight.

## 2014-07-03 NOTE — Addendum Note (Signed)
Addended by: Leighton Parody F on: 07/03/2014 01:39 PM   Modules accepted: Orders

## 2014-07-03 NOTE — Addendum Note (Signed)
Addended by: Leighton Parody F on: 07/03/2014 01:25 PM   Modules accepted: Orders

## 2014-07-03 NOTE — Progress Notes (Signed)
   Subjective:    Patient ID: Alexander Landry, male    DOB: 1932/12/25, 80 y.o.   MRN: [id number]  HPI He is here for a diabetes recheck. He checks his blood sugars periodically. He continues on medications listed in the chart. He has had an eye exam within the last year and does check his feet regularly. His exercise is minimal but he and his wife do plan to walk over the winter months. He has a remote history of smoking. He does not drink He has no other concerns or complaints.    Review of Systems     Objective:   Physical Exam Alert and in no distress. Hemoglobin A1c is 6.8       Assessment & Plan:  Hypertension associated with diabetes  Hyperlipidemia associated with type 2 diabetes mellitus  Type 2 diabetes mellitus without complication - Plan: Urinalysis Dipstick, US Aorta Initial Medicare Screen, Ankle brachial index  Screening for AAA (abdominal aortic aneurysm)  continue on present medication regimen. Encouraged him to walk on a daily basis year round. Encouraged him to check his blood sugars more regularly.

## 2014-07-10 ENCOUNTER — Encounter: Payer: Self-pay | Admitting: Family Medicine

## 2014-07-11 ENCOUNTER — Ambulatory Visit
Admission: RE | Admit: 2014-07-11 | Discharge: 2014-07-11 | Disposition: A | Discharge status: Home / Self Care | Payer: Commercial Managed Care - HMO | Source: Ambulatory Visit | Attending: Family Medicine | Admitting: Family Medicine

## 2014-07-11 DIAGNOSIS — E119 Type 2 diabetes mellitus without complications: Secondary | ICD-10-CM

## 2014-07-16 ENCOUNTER — Encounter: Payer: Self-pay | Admitting: Family Medicine

## 2014-07-30 ENCOUNTER — Telehealth: Payer: Self-pay | Admitting: Family Medicine

## 2014-07-30 ENCOUNTER — Other Ambulatory Visit: Payer: Self-pay

## 2014-07-30 MED ORDER — LISINOPRIL 40 MG PO TABS
40.0000 mg | ORAL_TABLET | Freq: Every day | ORAL | Status: DC
Start: 2014-07-30 — End: 2014-11-01

## 2014-07-30 NOTE — Telephone Encounter (Signed)
Pt called for refill on lisinopril sent to [facility] mail order pharmacy

## 2014-08-16 ENCOUNTER — Telehealth: Payer: Self-pay | Admitting: Family Medicine

## 2014-08-16 NOTE — Telephone Encounter (Signed)
Pt needs refill on simvastatin sent into [facility] mail order pharmacy.

## 2014-08-19 MED ORDER — SIMVASTATIN 20 MG PO TABS: 20.0000 mg | ORAL_TABLET | Freq: Every day | ORAL | Status: DC

## 2014-09-03 ENCOUNTER — Other Ambulatory Visit: Payer: Self-pay

## 2014-09-03 ENCOUNTER — Telehealth: Payer: Self-pay | Admitting: Internal Medicine

## 2014-09-03 ENCOUNTER — Telehealth: Payer: Self-pay | Admitting: Family Medicine

## 2014-09-03 DIAGNOSIS — K219 Gastro-esophageal reflux disease without esophagitis: Secondary | ICD-10-CM

## 2014-09-03 MED ORDER — ATENOLOL 100 MG PO TABS: 100.0000 mg | ORAL_TABLET | Freq: Every day | ORAL | Status: DC

## 2014-09-03 MED ORDER — OMEPRAZOLE 20 MG PO CPDR: DELAYED_RELEASE_CAPSULE | ORAL | Status: DC

## 2014-09-03 NOTE — Telephone Encounter (Signed)
done

## 2014-09-03 NOTE — Telephone Encounter (Signed)
Pt's wife called and stated that he needs refills on omeprazole sent to [facility] mail order pharmacy.

## 2014-09-03 NOTE — Telephone Encounter (Signed)
Pt needs a refill for atenolol 10mg  to [facility]

## 2014-09-16 ENCOUNTER — Telehealth: Payer: Self-pay | Admitting: Family Medicine

## 2014-09-16 DIAGNOSIS — E118 Type 2 diabetes mellitus with unspecified complications: Secondary | ICD-10-CM

## 2014-09-16 MED ORDER — PIOGLITAZONE HCL 30 MG PO TABS: ORAL_TABLET | ORAL | Status: DC

## 2014-09-16 MED ORDER — METFORMIN HCL 500 MG PO TABS: ORAL_TABLET | ORAL | Status: DC

## 2014-09-16 NOTE — Telephone Encounter (Signed)
done

## 2014-09-16 NOTE — Telephone Encounter (Signed)
Pt's wife called for refills of metformin and pioglitazone. Please send to [facility] mail order. Needs to be 90 day supply.

## 2014-10-25 LAB — HM DIABETES EYE EXAM

## 2014-10-30 ENCOUNTER — Ambulatory Visit: Payer: Medicare HMO | Admitting: Family Medicine

## 2014-11-01 ENCOUNTER — Ambulatory Visit (INDEPENDENT_AMBULATORY_CARE_PROVIDER_SITE_OTHER): Payer: Commercial Managed Care - HMO | Admitting: Family Medicine

## 2014-11-01 ENCOUNTER — Encounter: Payer: Self-pay | Admitting: Family Medicine

## 2014-11-01 VITALS — BP 140/80 | HR 60 | Ht 75.0 in | Wt 222.6 lb

## 2014-11-01 DIAGNOSIS — I10 Essential (primary) hypertension: Secondary | ICD-10-CM | POA: Diagnosis not present

## 2014-11-01 DIAGNOSIS — E1169 Type 2 diabetes mellitus with other specified complication: Secondary | ICD-10-CM

## 2014-11-01 DIAGNOSIS — I152 Hypertension secondary to endocrine disorders: Secondary | ICD-10-CM

## 2014-11-01 DIAGNOSIS — H9113 Presbycusis, bilateral: Secondary | ICD-10-CM | POA: Diagnosis not present

## 2014-11-01 DIAGNOSIS — E785 Hyperlipidemia, unspecified: Secondary | ICD-10-CM

## 2014-11-01 DIAGNOSIS — E119 Type 2 diabetes mellitus without complications: Secondary | ICD-10-CM | POA: Diagnosis not present

## 2014-11-01 DIAGNOSIS — E1159 Type 2 diabetes mellitus with other circulatory complications: Secondary | ICD-10-CM

## 2014-11-01 LAB — LIPID PANEL
CHOLESTEROL: 131 mg/dL (ref 0–200)
HDL: 34 mg/dL — ABNORMAL LOW (ref >=40)
LDL Cholesterol: 72 mg/dL (ref 0–99)
Total CHOL/HDL Ratio: 3.9 Ratio
Triglycerides: 125 mg/dL (ref <150)
VLDL: 25 mg/dL (ref 0–40)

## 2014-11-01 LAB — CBC WITH DIFFERENTIAL/PLATELET
BASOS ABS: 0.2 10*3/uL — AB (ref 0.0–0.1)
Basophils Relative: 2 % — ABNORMAL HIGH (ref 0–1)
EOS ABS: 0.4 10*3/uL (ref 0.0–0.7)
Eosinophils Relative: 4 % (ref 0–5)
HCT: 40.1 % (ref 39.0–52.0)
Hemoglobin: 14.1 g/dL (ref 13.0–17.0)
LYMPHS PCT: 35 % (ref 12–46)
Lymphs Abs: 3.2 10*3/uL (ref 0.7–4.0)
MCH: 31.8 pg (ref 26.0–34.0)
MCHC: 35.2 g/dL (ref 30.0–36.0)
MCV: 90.3 fL (ref 78.0–100.0)
MPV: 10.3 fL (ref 8.6–12.4)
Monocytes Absolute: 0.8 10*3/uL (ref 0.1–1.0)
Monocytes Relative: 9 % (ref 3–12)
Neutro Abs: 4.6 10*3/uL (ref 1.7–7.7)
Neutrophils Relative %: 50 % (ref 43–77)
PLATELETS: 280 10*3/uL (ref 150–400)
RBC: 4.44 MIL/uL (ref 4.22–5.81)
RDW: 14.1 % (ref 11.5–15.5)
WBC: 9.1 10*3/uL (ref 4.0–10.5)

## 2014-11-01 LAB — COMPREHENSIVE METABOLIC PANEL
ALT: 9 U/L (ref 0–53)
AST: 7 U/L (ref 0–37)
Albumin: 3.9 g/dL (ref 3.5–5.2)
Alkaline Phosphatase: 59 U/L (ref 39–117)
BILIRUBIN TOTAL: 1.1 mg/dL (ref 0.2–1.2)
BUN: 13 mg/dL (ref 6–23)
CO2: 24 mEq/L (ref 19–32)
Calcium: 8.9 mg/dL (ref 8.4–10.5)
Chloride: 101 mEq/L (ref 96–112)
Creat: 0.88 mg/dL (ref 0.50–1.35)
GLUCOSE: 125 mg/dL — AB (ref 70–99)
POTASSIUM: 3.8 meq/L (ref 3.5–5.3)
Sodium: 136 mEq/L (ref 135–145)
TOTAL PROTEIN: 6.5 g/dL (ref 6.0–8.3)

## 2014-11-01 LAB — POCT GLYCOSYLATED HEMOGLOBIN (HGB A1C): Hemoglobin A1C: 6.6

## 2014-11-01 MED ORDER — LISINOPRIL 40 MG PO TABS: 40.0000 mg | ORAL_TABLET | Freq: Every day | ORAL | Status: DC

## 2014-11-01 MED ORDER — GLUCOSE BLOOD VI STRP: ORAL_STRIP | Status: DC

## 2014-11-01 MED ORDER — AMLODIPINE BESYLATE 10 MG PO TABS: ORAL_TABLET | ORAL | Status: DC

## 2014-11-01 NOTE — Patient Instructions (Signed)
Check your blood sugars either before you eat or 2 hours after you eat. Always keep your blood sugar below 180

## 2014-11-01 NOTE — Progress Notes (Signed)
   Subjective:    Patient ID: Alexander Landry, male    DOB: Feb 26, 1933, 81 y.o.   MRN: [id number]  HPI He is here for a diabetes recheck. He is checking his blood sugars usually only couple times per month. His medications were reviewed and are listed in the chart. His diet and exercise are unchanged. He has no particular concerns or questions. He does have an eye exam scheduled in the very near future. He does check his feet periodically. He quit smoking approximately 10 years ago. He does not drink. He has no particular concerns or complaints but does need several of his medications renewed.he does have bilateral hearing aids and in spite of this has difficulty hearing.   Review of Systems     Objective:   Physical Exam Alert and in no distress. Cardiac exam shows regular rhythm without murmurs. Lungs are clear to auscultation. Hemoglobin A1c is 6.6. Foot exam also done and is normal.       Assessment & Plan:  Diabetes mellitus without complication - Plan: HgB A1c, CBC with Differential/Platelet, Comprehensive metabolic panel, Lipid panel, POCT UA - Microalbumin, glucose blood test strip  Hypertension associated with diabetes - Plan: CBC with Differential/Platelet, Comprehensive metabolic panel, lisinopril (PRINIVIL,ZESTRIL) 40 MG tablet, amLODipine (NORVASC) 10 MG tablet  Hyperlipidemia with target LDL less than 70 - Plan: Lipid panel  Presbycusis of both ears continue on present medication regimen.

## 2014-11-04 ENCOUNTER — Encounter: Payer: Self-pay | Admitting: Internal Medicine

## 2014-11-07 ENCOUNTER — Encounter: Payer: Self-pay | Admitting: Internal Medicine

## 2014-11-14 ENCOUNTER — Telehealth: Payer: Self-pay | Admitting: Internal Medicine

## 2014-11-14 NOTE — Telephone Encounter (Signed)
Called but phone rang and rang. Will try tomorrow

## 2014-11-14 NOTE — Telephone Encounter (Signed)
Got a refill request from pharmacy for a refill on metformin but pharmacy is wanting to know does pt take 1 tab daily or 2 tablets daily. Please clarify and send in new rx

## 2014-11-14 NOTE — Telephone Encounter (Signed)
My notes say twice per day but check with the patient to make sure. I think he might be only taking it once per day which would be fine

## 2014-11-15 NOTE — Telephone Encounter (Signed)
Called pt and he takes metformin 2 times daily but does not need any refills on it at this time

## 2014-11-20 ENCOUNTER — Other Ambulatory Visit: Payer: Self-pay | Admitting: Family Medicine

## 2014-12-16 ENCOUNTER — Other Ambulatory Visit: Payer: Self-pay

## 2014-12-16 ENCOUNTER — Telehealth: Payer: Self-pay | Admitting: Family Medicine

## 2014-12-16 DIAGNOSIS — E118 Type 2 diabetes mellitus with unspecified complications: Secondary | ICD-10-CM

## 2014-12-16 MED ORDER — PIOGLITAZONE HCL 30 MG PO TABS: ORAL_TABLET | ORAL | Status: DC

## 2014-12-16 NOTE — Telephone Encounter (Signed)
I have sent in Actos

## 2014-12-16 NOTE — Telephone Encounter (Signed)
Pt's wife called and stated pt needs refill on actos. Please send to [facility] mail order pharmacy.

## 2015-02-17 ENCOUNTER — Other Ambulatory Visit: Payer: Self-pay

## 2015-02-17 ENCOUNTER — Telehealth: Payer: Self-pay | Admitting: Family Medicine

## 2015-02-17 DIAGNOSIS — I10 Essential (primary) hypertension: Principal | ICD-10-CM

## 2015-02-17 DIAGNOSIS — E1159 Type 2 diabetes mellitus with other circulatory complications: Secondary | ICD-10-CM

## 2015-02-17 MED ORDER — LISINOPRIL 40 MG PO TABS: 40.0000 mg | ORAL_TABLET | Freq: Every day | ORAL | Status: DC

## 2015-02-17 NOTE — Telephone Encounter (Signed)
Pt needs refill of lisinopril sent to [facility] mail order.

## 2015-03-10 ENCOUNTER — Other Ambulatory Visit: Payer: Medicare HMO

## 2015-03-11 ENCOUNTER — Other Ambulatory Visit: Payer: Commercial Managed Care - HMO

## 2015-03-19 ENCOUNTER — Other Ambulatory Visit: Payer: Self-pay

## 2015-03-19 ENCOUNTER — Telehealth: Payer: Self-pay | Admitting: Family Medicine

## 2015-03-19 MED ORDER — METFORMIN HCL 500 MG PO TABS: 500.0000 mg | ORAL_TABLET | Freq: Two times a day (BID) | ORAL | Status: DC

## 2015-03-19 MED ORDER — ATENOLOL 100 MG PO TABS: 100.0000 mg | ORAL_TABLET | Freq: Every day | ORAL | Status: DC

## 2015-03-19 NOTE — Telephone Encounter (Signed)
done

## 2015-03-19 NOTE — Telephone Encounter (Signed)
Pt needs refills on Metformin and Atenolol both for 90 days to [facility] mail order pharmacy

## 2015-06-13 ENCOUNTER — Telehealth: Payer: Self-pay | Admitting: Family Medicine

## 2015-06-13 DIAGNOSIS — E118 Type 2 diabetes mellitus with unspecified complications: Secondary | ICD-10-CM

## 2015-06-13 MED ORDER — PIOGLITAZONE HCL 30 MG PO TABS: ORAL_TABLET | ORAL | Status: DC

## 2015-06-13 NOTE — Telephone Encounter (Signed)
Pt called requesting a refill for his Actos, pt uses [facility] [facility] [address], pt made a diabetes appt for Dec the 12th .

## 2015-06-13 NOTE — Telephone Encounter (Signed)
Sent med to [facility]

## 2015-06-23 ENCOUNTER — Encounter: Payer: Self-pay | Admitting: Family Medicine

## 2015-06-23 ENCOUNTER — Ambulatory Visit (INDEPENDENT_AMBULATORY_CARE_PROVIDER_SITE_OTHER): Payer: Commercial Managed Care - HMO | Admitting: Family Medicine

## 2015-06-23 VITALS — BP 120/70 | HR 68 | Temp 97.9°F | Ht 75.0 in | Wt 216.6 lb

## 2015-06-23 DIAGNOSIS — I10 Essential (primary) hypertension: Secondary | ICD-10-CM | POA: Diagnosis not present

## 2015-06-23 DIAGNOSIS — E118 Type 2 diabetes mellitus with unspecified complications: Secondary | ICD-10-CM | POA: Diagnosis not present

## 2015-06-23 DIAGNOSIS — E785 Hyperlipidemia, unspecified: Secondary | ICD-10-CM | POA: Diagnosis not present

## 2015-06-23 DIAGNOSIS — E1159 Type 2 diabetes mellitus with other circulatory complications: Secondary | ICD-10-CM | POA: Diagnosis not present

## 2015-06-23 DIAGNOSIS — H9113 Presbycusis, bilateral: Secondary | ICD-10-CM | POA: Diagnosis not present

## 2015-06-23 DIAGNOSIS — Z23 Encounter for immunization: Secondary | ICD-10-CM

## 2015-06-23 DIAGNOSIS — K219 Gastro-esophageal reflux disease without esophagitis: Secondary | ICD-10-CM | POA: Diagnosis not present

## 2015-06-23 LAB — POCT GLYCOSYLATED HEMOGLOBIN (HGB A1C): Hemoglobin A1C: 6.6

## 2015-06-23 MED ORDER — METFORMIN HCL 500 MG PO TABS: 500.0000 mg | ORAL_TABLET | Freq: Two times a day (BID) | ORAL | Status: DC

## 2015-06-23 NOTE — Progress Notes (Signed)
  Subjective:   Alexander Landry is an 81 y.o. male who presents for follow up of Type 2 diabetes mellitus.   Patient is checking home blood sugars.   Home blood sugar records: BGs range between 135 and 135 . He does not check them very often. Current symptoms include: none. Patient denies foot ulcerations.  Patient is checking their feet daily. Foot concerns (callous, ulcer, wound, thickened nails, toenail fungus, skin fungus, hammer toe): none Last dilated eye exam last June.  Current treatments: no recent interventions. Medication compliance: excellent  Current diet: His wife does the cooking Current exercise: none Known diabetic complications: none   The following portions of the patient's history were reviewed and updated as appropriate: allergies, current medications, past family history, past medical history, past social history, past surgical history and problem list.  ROS as in subjective above    Objective:  HbA1C 6.6    Assessment:   Hypertension associated with diabetes ([place])  Hyperlipidemia with target LDL less than 70  Presbycusis of both ears  Type 2 diabetes mellitus with complication, without long-term current use of insulin ([place]) - Plan: metFORMIN (GLUCOPHAGE) 500 MG tablet, HgB A1c  Gastroesophageal reflux disease without esophagitis  Need for prophylactic vaccination with combined diphtheria-tetanus-pertussis (DTP) vaccine - Plan: Tdap vaccine greater than or equal to 7yo IM  Need for prophylactic vaccination against Streptococcus pneumoniae (pneumococcus) - Plan: Pneumococcal polysaccharide vaccine 23-valent greater than or equal to 2yo subcutaneous/IM    Plan:   Diabetes Mellitus type 2: Education: Reviewed 'ABCs' of diabetes management (respective goals in parentheses):  A1C (<7), blood pressure (<130/80), and cholesterol (LDL <100)   Diabetes mellitus Type II, under excellent control.   Compliance at present is estimated to be good.  Efforts to improve compliance (if necessary) will be directed at Continuing present medication and lifestyle.    Blood pressure: normal blood pressure .   An ACE/ARB is currently part of their treatment regimen.   Dyslipidemia under good control. .  A statin is currently part of their treatment regimen.   Encouraged aerobic exercise. Discussed foot care. Reminded to get yearly retinal exam.    Follow up: 4 months Immunizations were updated including TDap since he will be around infants in the near future

## 2015-08-25 ENCOUNTER — Telehealth: Payer: Self-pay | Admitting: Family Medicine

## 2015-08-25 ENCOUNTER — Other Ambulatory Visit: Payer: Self-pay | Admitting: *Deleted

## 2015-08-25 MED ORDER — SIMVASTATIN 20 MG PO TABS: 20.0000 mg | ORAL_TABLET | Freq: Every day | ORAL | Status: DC

## 2015-08-25 NOTE — Telephone Encounter (Signed)
Pt needs RF Simvastatin to [facility] mail order for 90 days

## 2015-09-15 ENCOUNTER — Telehealth: Payer: Self-pay | Admitting: Family Medicine

## 2015-09-15 NOTE — Telephone Encounter (Signed)
Pt called and made an appt,. Needs refills of omeprazole and pioglitazone sent to [facility] mail order for 90 days. Pt can be reached at [phone].

## 2015-09-16 ENCOUNTER — Other Ambulatory Visit: Payer: Self-pay | Admitting: *Deleted

## 2015-09-16 DIAGNOSIS — K219 Gastro-esophageal reflux disease without esophagitis: Secondary | ICD-10-CM

## 2015-09-16 MED ORDER — PIOGLITAZONE HCL 30 MG PO TABS: ORAL_TABLET | ORAL | Status: DC

## 2015-09-16 MED ORDER — OMEPRAZOLE 20 MG PO CPDR: DELAYED_RELEASE_CAPSULE | ORAL | Status: DC

## 2015-09-23 ENCOUNTER — Other Ambulatory Visit: Payer: Self-pay | Admitting: *Deleted

## 2015-09-23 ENCOUNTER — Telehealth: Payer: Self-pay | Admitting: Family Medicine

## 2015-09-23 DIAGNOSIS — E118 Type 2 diabetes mellitus with unspecified complications: Secondary | ICD-10-CM

## 2015-09-23 MED ORDER — METFORMIN HCL 500 MG PO TABS
500.0000 mg | ORAL_TABLET | Freq: Two times a day (BID) | ORAL | Status: DC
Start: 2015-09-23 — End: 2015-12-29

## 2015-09-23 MED ORDER — ATENOLOL 100 MG PO TABS: 100.0000 mg | ORAL_TABLET | Freq: Every day | ORAL | Status: DC

## 2015-09-23 NOTE — Telephone Encounter (Signed)
Abbie please handle

## 2015-09-23 NOTE — Telephone Encounter (Signed)
Refills sent in to pharmacy

## 2015-09-23 NOTE — Telephone Encounter (Signed)
Pt was in with his wife and states that he needs refills on his metformin and his atenolol. pt uses [facility], [place]

## 2015-10-23 ENCOUNTER — Ambulatory Visit (INDEPENDENT_AMBULATORY_CARE_PROVIDER_SITE_OTHER): Payer: Commercial Managed Care - HMO | Admitting: Family Medicine

## 2015-10-23 ENCOUNTER — Encounter: Payer: Self-pay | Admitting: Family Medicine

## 2015-10-23 VITALS — BP 130/80 | HR 68 | Ht 75.0 in | Wt 211.4 lb

## 2015-10-23 DIAGNOSIS — L723 Sebaceous cyst: Secondary | ICD-10-CM | POA: Diagnosis not present

## 2015-10-23 DIAGNOSIS — H9113 Presbycusis, bilateral: Secondary | ICD-10-CM | POA: Diagnosis not present

## 2015-10-23 DIAGNOSIS — I10 Essential (primary) hypertension: Secondary | ICD-10-CM

## 2015-10-23 DIAGNOSIS — E118 Type 2 diabetes mellitus with unspecified complications: Secondary | ICD-10-CM

## 2015-10-23 DIAGNOSIS — B351 Tinea unguium: Secondary | ICD-10-CM

## 2015-10-23 DIAGNOSIS — E1159 Type 2 diabetes mellitus with other circulatory complications: Secondary | ICD-10-CM | POA: Diagnosis not present

## 2015-10-23 DIAGNOSIS — E785 Hyperlipidemia, unspecified: Secondary | ICD-10-CM | POA: Diagnosis not present

## 2015-10-23 LAB — CBC WITH DIFFERENTIAL/PLATELET
Basophils Absolute: 104 cells/uL (ref 0–200)
Basophils Relative: 1 %
EOS ABS: 312 {cells}/uL (ref 15–500)
Eosinophils Relative: 3 %
HCT: 43.3 % (ref 38.5–50.0)
Hemoglobin: 14.5 g/dL (ref 13.2–17.1)
LYMPHS PCT: 30 %
Lymphs Abs: 3120 cells/uL (ref 850–3900)
MCH: 31.1 pg (ref 27.0–33.0)
MCHC: 33.5 g/dL (ref 32.0–36.0)
MCV: 92.9 fL (ref 80.0–100.0)
MPV: 10.6 fL (ref 7.5–12.5)
Monocytes Absolute: 1040 cells/uL — ABNORMAL HIGH (ref 200–950)
Monocytes Relative: 10 %
Neutro Abs: 5824 cells/uL (ref 1500–7800)
Neutrophils Relative %: 56 %
PLATELETS: 289 10*3/uL (ref 140–400)
RBC: 4.66 MIL/uL (ref 4.20–5.80)
RDW: 14 % (ref 11.0–15.0)
WBC: 10.4 10*3/uL (ref 4.0–10.5)

## 2015-10-23 LAB — COMPREHENSIVE METABOLIC PANEL
ALK PHOS: 65 U/L (ref 40–115)
ALT: 8 U/L — AB (ref 9–46)
AST: 6 U/L — ABNORMAL LOW (ref 10–35)
Albumin: 4.1 g/dL (ref 3.6–5.1)
BUN: 15 mg/dL (ref 7–25)
CHLORIDE: 103 mmol/L (ref 98–110)
CO2: 21 mmol/L (ref 20–31)
Calcium: 9 mg/dL (ref 8.6–10.3)
Creat: 1.02 mg/dL (ref 0.70–1.11)
GLUCOSE: 133 mg/dL — AB (ref 65–99)
POTASSIUM: 4 mmol/L (ref 3.5–5.3)
Sodium: 140 mmol/L (ref 135–146)
Total Bilirubin: 1 mg/dL (ref 0.2–1.2)
Total Protein: 6.8 g/dL (ref 6.1–8.1)

## 2015-10-23 LAB — LIPID PANEL
Cholesterol: 121 mg/dL — ABNORMAL LOW (ref 125–200)
HDL: 40 mg/dL (ref >=40)
LDL Cholesterol: 59 mg/dL (ref <130)
Total CHOL/HDL Ratio: 3 Ratio (ref <=5.0)
Triglycerides: 108 mg/dL (ref <150)
VLDL: 22 mg/dL (ref <30)

## 2015-10-23 LAB — POCT GLYCOSYLATED HEMOGLOBIN (HGB A1C): Hemoglobin A1C: 6.4

## 2015-10-23 LAB — POCT UA - MICROALBUMIN
Albumin/Creatinine Ratio, Urine, POC: 4.3
CREATININE, POC: 229.9 mg/dL
Microalbumin Ur, POC: 9.9 mg/L

## 2015-10-23 NOTE — Progress Notes (Signed)
  Subjective:    Patient ID: Alexander Landry, male    DOB: January 02, 1933, 82 y.o.   MRN: [id number]  Alexander Landry is a 82 y.o. male who presents for follow-up of Type 2 diabetes mellitus.  Patient sometimes checking home blood sugars.   Home blood sugar records: 130 How often is blood sugars being checked:around 2 x a month Current symptoms/problems none Daily foot checks yes   Any foot concerns: toenails need to be cut  Last eye exam: 10/25/14 Exercise: walking  The following portions of the patient's history were reviewed and updated as appropriate: allergies, current medications, past medical history, past social history and problem list.  ROS as in subjective above.     Objective:    Physical Exam Alert and in no distress. foot exam performed. Thickened nails noted.darkish lesion noted on the mid forehead. Sebaceous cyst material was expressed without difficulty.  Height '6\' 3"'$  (1.905 m), weight 211 lb 6.4 oz (95.89 kg).  Lab Review Diabetic Labs Latest Ref Rng 06/23/2015 11/01/2014 07/03/2014 03/04/2014 10/30/2013  HbA1c - 6.6 6.6 6.8 7.9 6.2  Chol 0 - 200 mg/dL - 131 - - 119  HDL >=40 mg/dL - 34(L) - - 37(L)  Calc LDL 0 - 99 mg/dL - 72 - - 67  Triglycerides <150 mg/dL - 125 - - 75  Creatinine 0.50 - 1.35 mg/dL - 0.88 - - 0.98   BP/Weight 10/23/2015 06/23/2015 11/01/2014 07/03/2014 5/36/1443  Systolic BP - 154 008 676 195  Diastolic BP - 70 80 74 80  Wt. (Lbs) 211.4 216.6 222.6 226 222  BMI 26.42 27.07 27.82 30.64 30.1   Foot/eye exam completion dates Latest Ref Rng 11/01/2014 10/25/2014  Eye Exam No Retinopathy - No Retinopathy  Foot Form Completion - Done -  A1C is 6.4  Alexander Landry  reports that he has quit smoking. He has never used smokeless tobacco. He reports that he does not drink alcohol or use illicit drugs.     Assessment & Plan:    Type 2 diabetes mellitus with complication, without long-term current use of insulin ([place]) - Plan: POCT glycosylated hemoglobin  (Hb A1C), POCT UA - Microalbumin, CBC with Differential/Platelet, Comprehensive metabolic panel, Lipid panel  Presbycusis of both ears  Hypertension associated with diabetes ([place]) - Plan: CBC with Differential/Platelet, Comprehensive metabolic panel  Hyperlipidemia with target LDL less than 70 - Plan: Lipid panel  Sebaceous cyst  Onychomycosis   1. Rx changes: none 2. Education: Reviewed 'ABCs' of diabetes management (respective goals in parentheses):  A1C (<7), blood pressure (<130/80), and cholesterol (LDL <100). 3. Compliance at present is estimated to be good. Efforts to improve compliance (if necessary) will be directed at increased exercise. 4. Follow up: 4 months Also recommend he follow-up with his podiatrist to have his nails trimmed.

## 2015-11-11 ENCOUNTER — Other Ambulatory Visit: Payer: Self-pay | Admitting: *Deleted

## 2015-11-11 DIAGNOSIS — I10 Essential (primary) hypertension: Principal | ICD-10-CM

## 2015-11-11 DIAGNOSIS — E1159 Type 2 diabetes mellitus with other circulatory complications: Secondary | ICD-10-CM

## 2015-11-11 MED ORDER — AMLODIPINE BESYLATE 10 MG PO TABS: ORAL_TABLET | ORAL | Status: DC

## 2015-12-25 ENCOUNTER — Telehealth: Payer: Self-pay | Admitting: Family Medicine

## 2015-12-25 MED ORDER — ATENOLOL 100 MG PO TABS: 100.0000 mg | ORAL_TABLET | Freq: Every day | ORAL | Status: DC

## 2015-12-25 NOTE — Telephone Encounter (Signed)
Pt called for refills of Atenolol. Pt has appt in Aug. Please send to [facility] mail order. Needs to be for 90 days.

## 2015-12-25 NOTE — Telephone Encounter (Signed)
Sent in med to pharmacy 

## 2015-12-29 ENCOUNTER — Telehealth: Payer: Self-pay | Admitting: Family Medicine

## 2015-12-29 ENCOUNTER — Other Ambulatory Visit: Payer: Self-pay

## 2015-12-29 DIAGNOSIS — E118 Type 2 diabetes mellitus with unspecified complications: Secondary | ICD-10-CM

## 2015-12-29 MED ORDER — METFORMIN HCL 500 MG PO TABS: 500.0000 mg | ORAL_TABLET | Freq: Two times a day (BID) | ORAL | Status: DC

## 2015-12-29 NOTE — Telephone Encounter (Signed)
Pt called for refill of metformin. Please send to [facility] mail order pharmacy

## 2016-01-01 ENCOUNTER — Other Ambulatory Visit: Payer: Self-pay

## 2016-01-01 MED ORDER — ATENOLOL 50 MG PO TABS: ORAL_TABLET | ORAL | Status: DC

## 2016-01-29 ENCOUNTER — Telehealth: Payer: Self-pay | Admitting: Family Medicine

## 2016-01-29 ENCOUNTER — Other Ambulatory Visit: Payer: Self-pay

## 2016-01-29 MED ORDER — ATENOLOL 50 MG PO TABS: ORAL_TABLET | ORAL | Status: DC

## 2016-01-29 NOTE — Telephone Encounter (Signed)
Pt called stating that [facility] told him that Atenolol 50 mg is no longer made & pt said he will need another med. Called [facility] to verify this info & they stated that the issue is that Atenolol 50 mg is on back order and they will not have it in stock until about mid August at any of the [facility]. [facility] [address] [place] confirmed that they have Atenolol 50 mg in stock so pt wants small supply sent there for now since he only has #4 left. Pt wants to be call when this is all worked out.

## 2016-01-29 NOTE — Telephone Encounter (Signed)
Called med to [facility] informed pt wife

## 2016-02-09 ENCOUNTER — Telehealth: Payer: Self-pay

## 2016-02-09 ENCOUNTER — Other Ambulatory Visit: Payer: Self-pay

## 2016-02-09 MED ORDER — ATENOLOL 100 MG PO TABS: 100.0000 mg | ORAL_TABLET | Freq: Every day | ORAL | 1 refills | Status: DC

## 2016-02-09 NOTE — Telephone Encounter (Signed)
Sent med in 

## 2016-02-09 NOTE — Telephone Encounter (Signed)
Needs refill of amlodipine sent to [facility] mail order pharmacy please. Victorino December

## 2016-02-11 ENCOUNTER — Ambulatory Visit (INDEPENDENT_AMBULATORY_CARE_PROVIDER_SITE_OTHER): Payer: Commercial Managed Care - HMO | Admitting: Family Medicine

## 2016-02-11 ENCOUNTER — Encounter: Payer: Self-pay | Admitting: Family Medicine

## 2016-02-11 VITALS — BP 140/82 | HR 64 | Temp 97.7°F | Wt 210.2 lb

## 2016-02-11 DIAGNOSIS — I10 Essential (primary) hypertension: Secondary | ICD-10-CM | POA: Diagnosis not present

## 2016-02-11 DIAGNOSIS — E1159 Type 2 diabetes mellitus with other circulatory complications: Secondary | ICD-10-CM

## 2016-02-11 DIAGNOSIS — I152 Hypertension secondary to endocrine disorders: Secondary | ICD-10-CM

## 2016-02-11 DIAGNOSIS — R079 Chest pain, unspecified: Secondary | ICD-10-CM | POA: Diagnosis not present

## 2016-02-11 LAB — CBC WITH DIFFERENTIAL/PLATELET
Basophils Absolute: 96 cells/uL (ref 0–200)
Basophils Relative: 1 %
Eosinophils Absolute: 192 cells/uL (ref 15–500)
Eosinophils Relative: 2 %
HEMATOCRIT: 41.5 % (ref 38.5–50.0)
Hemoglobin: 14.1 g/dL (ref 13.2–17.1)
LYMPHS PCT: 34 %
Lymphs Abs: 3264 cells/uL (ref 850–3900)
MCH: 31 pg (ref 27.0–33.0)
MCHC: 34 g/dL (ref 32.0–36.0)
MCV: 91.2 fL (ref 80.0–100.0)
MPV: 10.1 fL (ref 7.5–12.5)
Monocytes Absolute: 864 cells/uL (ref 200–950)
Monocytes Relative: 9 %
Neutro Abs: 5184 cells/uL (ref 1500–7800)
Neutrophils Relative %: 54 %
PLATELETS: 288 10*3/uL (ref 140–400)
RBC: 4.55 MIL/uL (ref 4.20–5.80)
RDW: 14.3 % (ref 11.0–15.0)
WBC: 9.6 10*3/uL (ref 4.0–10.5)

## 2016-02-11 LAB — COMPREHENSIVE METABOLIC PANEL
ALT: 9 U/L (ref 9–46)
AST: 7 U/L — AB (ref 10–35)
Albumin: 4 g/dL (ref 3.6–5.1)
Alkaline Phosphatase: 75 U/L (ref 40–115)
BUN: 14 mg/dL (ref 7–25)
CO2: 25 mmol/L (ref 20–31)
Calcium: 9.1 mg/dL (ref 8.6–10.3)
Chloride: 102 mmol/L (ref 98–110)
Creat: 0.91 mg/dL (ref 0.70–1.11)
Glucose, Bld: 111 mg/dL — ABNORMAL HIGH (ref 65–99)
Potassium: 4.5 mmol/L (ref 3.5–5.3)
Sodium: 138 mmol/L (ref 135–146)
TOTAL PROTEIN: 7 g/dL (ref 6.1–8.1)
Total Bilirubin: 0.7 mg/dL (ref 0.2–1.2)

## 2016-02-11 MED ORDER — DEXLANSOPRAZOLE 60 MG PO CPDR: 60.0000 mg | DELAYED_RELEASE_CAPSULE | Freq: Every day | ORAL | 0 refills | Status: DC

## 2016-02-11 NOTE — Patient Instructions (Signed)
Stop the omeprazole and start taking once daily Dexilant for the next 5 days. You can continue taking Tylenol since this is helping.  I would like for you to follow up with Dr. Redmond School next week for this.  If your pain gets worse or if you develop any new symptoms such as dizziness, lightheadedness, stomach upset then call 911 or go to the emergency department.

## 2016-02-11 NOTE — Progress Notes (Signed)
Subjective:    Patient ID: Alexander Landry, male    DOB: 1932/08/11, 82 y.o.   MRN: [id number]  HPI Chief Complaint  Patient presents with  . chest congestion    chest congestion due to acid reflux    He is an 82 year old male with a history of DM type 2, HTN, hyperlipidemia who is here with complaints of diffuse chest pain. States pain is nonradiating. Pain is described as a "tightness" and sometimes a "soreness". Constant pain for past 3 weeks. States nothing makes pain worse. States he has been walking for exercise and this does not make the pain worse. Is taking Tylenol and pain resolves for a few hours but comes back when medication wears off. He thinks the pain is related to acid reflux. Is taking Omeprazole daily.  No history of cardiac issues and States he has not seen a cardiologist in the past.   Former smoker, quit 10 years ago.  Cardiac risk factors: male, age, DM2, hyperlipidemia, HTN, former smoker.   Denies fever, chills, fatigue, diaphoresis, headache, dizziness, syncope, visual disturbances, cough, DOE, orthopnea, abdominal pain, nausea, vomiting, LE edema. No numbness, tingling or weakness.   Reviewed allergies, medications, past medical,  social history.    Review of Systems Pertinent positives and negatives in the history of present illness.     Objective:   Physical Exam  Constitutional: He is oriented to person, place, and time. He appears well-developed and well-nourished. No distress.  Eyes: Pupils are equal, round, and reactive to light.  Neck: Normal range of motion. Neck supple. No JVD present.  Cardiovascular: Normal rate, regular rhythm, S1 normal, S2 normal and normal pulses.  Exam reveals no gallop and no friction rub.   No murmur heard. Pulmonary/Chest: Effort normal and breath sounds normal. He exhibits tenderness.  Mild tenderness to palpation  Abdominal: Soft. Normal appearance and bowel sounds are normal. There is no tenderness. There is  no rigidity, no rebound, no guarding, no CVA tenderness, no tenderness at McBurney's point and negative Murphy's sign.  Neurological: He is alert and oriented to person, place, and time. He has normal strength. No sensory deficit. Gait normal.  Skin: Skin is warm and dry. No pallor.  Psychiatric: He has a normal mood and affect. His speech is normal and behavior is normal. Judgment and thought content normal. Cognition and memory are normal.   BP 140/82   Pulse 64   Temp 97.7 F (36.5 C) (Oral)   Wt 210 lb 3.2 oz (95.3 kg)   BMI 26.27 kg/m    ECG: chest pain Rate 55 PR interval 270 ms QRS duration 106 ms Compared to ECG from 1999- changes are bradycardia and 1st degree AV block, otherwise no changes.      Assessment & Plan:  Chest pain, unspecified chest pain type - Plan: EKG 12-Lead, CBC with Differential/Platelet, Comprehensive metabolic panel  Hypertension associated with diabetes ([place])  Discussed that he is overall well appearing and does not appear to be having an acute cardiac event but if his chest pain worsens or if He develops any new symptoms such as dizziness, syncope, GI upset that I recommend he call 911 or go to the emergency department. ECG compared to old in 1999 and bradycardia and 1st degree AV block are new otherwise no acute changes.  Suspect symptoms may be related to GERD. Sample of [place] #5 given to patient with instructions to stop omeprazole and try this medication once daily for the  next 5 days to see if he notices improvement with symptoms. I recommend he follow-up next week with Dr. Redmond School for re-evaluation of symptoms and possible referral to cardiology.

## 2016-02-16 ENCOUNTER — Encounter: Payer: Self-pay | Admitting: Family Medicine

## 2016-02-16 ENCOUNTER — Ambulatory Visit (INDEPENDENT_AMBULATORY_CARE_PROVIDER_SITE_OTHER): Payer: Commercial Managed Care - HMO | Admitting: Family Medicine

## 2016-02-16 VITALS — BP 140/80 | HR 62 | Ht 75.0 in | Wt 211.0 lb

## 2016-02-16 DIAGNOSIS — R0789 Other chest pain: Secondary | ICD-10-CM | POA: Diagnosis not present

## 2016-02-16 DIAGNOSIS — I10 Essential (primary) hypertension: Secondary | ICD-10-CM

## 2016-02-16 DIAGNOSIS — E1159 Type 2 diabetes mellitus with other circulatory complications: Secondary | ICD-10-CM | POA: Diagnosis not present

## 2016-02-16 MED ORDER — AMLODIPINE BESYLATE 10 MG PO TABS: ORAL_TABLET | ORAL | 3 refills | Status: DC

## 2016-02-16 NOTE — Progress Notes (Signed)
   Subjective:    Patient ID: Alexander Landry, male    DOB: December 23, 1932, 82 y.o.   MRN: [id number]  HPI He is here for follow-up visit concerning his chest tenderness. He states that it is worse with motion of his chest  and taking a deep He does state that Tylenol does seem to help.   Review of Systems     Objective:   Physical Exam Alert and in no distress. Slight nonspecific anterior chest wall tenderness upon compression of the sternal area. Cardiac and lung exam are unchanged.       Assessment & Plan:  Chest wall pain  Hypertension associated with diabetes ([place]) - Plan: amLODipine (NORVASC) 10 MG tablet His Norvasc was renewed. Also recommend 2 Aleve twice per day for the next 2 weeks and call me if further trouble. He then discussed the fact that he is on Prilosec and since he is really not having  The way of symptoms, I recommended that he stop th

## 2016-02-16 NOTE — Patient Instructions (Addendum)
Take 2 Aleve twice per day for the next 2 weeks 

## 2016-02-23 ENCOUNTER — Ambulatory Visit: Payer: Self-pay | Admitting: Family Medicine

## 2016-03-01 ENCOUNTER — Other Ambulatory Visit: Payer: Self-pay

## 2016-03-01 ENCOUNTER — Telehealth: Payer: Self-pay | Admitting: Family Medicine

## 2016-03-01 DIAGNOSIS — I10 Essential (primary) hypertension: Principal | ICD-10-CM

## 2016-03-01 DIAGNOSIS — E1159 Type 2 diabetes mellitus with other circulatory complications: Secondary | ICD-10-CM

## 2016-03-01 MED ORDER — LISINOPRIL 40 MG PO TABS: 40.0000 mg | ORAL_TABLET | Freq: Every day | ORAL | 3 refills | Status: DC

## 2016-03-01 NOTE — Telephone Encounter (Signed)
I have sent in lisinopril

## 2016-03-01 NOTE — Telephone Encounter (Signed)
Pt called for refill of lisinopril. Please send to [facility] mail order. Pt can be reached at [phone].

## 2016-03-25 ENCOUNTER — Other Ambulatory Visit: Payer: Self-pay | Admitting: Medical

## 2016-03-25 ENCOUNTER — Ambulatory Visit
Admission: RE | Admit: 2016-03-25 | Discharge: 2016-03-25 | Disposition: A | Discharge status: Home / Self Care | Payer: Commercial Managed Care - HMO | Source: Ambulatory Visit | Attending: Medical | Admitting: Medical

## 2016-03-25 ENCOUNTER — Ambulatory Visit (INDEPENDENT_AMBULATORY_CARE_PROVIDER_SITE_OTHER): Payer: Commercial Managed Care - HMO | Admitting: Medical

## 2016-03-25 ENCOUNTER — Encounter: Payer: Self-pay | Admitting: Medical

## 2016-03-25 VITALS — BP 120/70 | HR 62 | Resp 12 | Wt 212.0 lb

## 2016-03-25 DIAGNOSIS — Q7649 Other congenital malformations of spine, not associated with scoliosis: Secondary | ICD-10-CM | POA: Diagnosis not present

## 2016-03-25 DIAGNOSIS — R0789 Other chest pain: Secondary | ICD-10-CM

## 2016-03-25 DIAGNOSIS — R079 Chest pain, unspecified: Secondary | ICD-10-CM | POA: Insufficient documentation

## 2016-03-25 DIAGNOSIS — Z23 Encounter for immunization: Secondary | ICD-10-CM | POA: Diagnosis not present

## 2016-03-25 NOTE — Progress Notes (Signed)
Subjective Chief Complaint  Patient presents with  . Chest Pain    chest wall pain    Here for pain in upper chest across the chest for 3-4 weeks.   He apparently has been seen here for this already twice since early August with Vickie and Dr. Redmond School.  He thinks it is coming from Atenolol.  Was on Atenolol '100mg'$  daily, but due to back order had to be switched to '50mg'$ , 2 tablets daily.  Since then he notes having this pain.   Pain is daily, intermittent, but more steady than not.  Been using aleve without improvement.  However, aleve has upset his stomach some.  He takes Prilosec and been on this for years. Denies SOB, wheezing, arm pain, edema, palpations, hemoptysis, no hematemesis, no paresthesia.   He notes fat pad loss above clavicles and he notes was caused by prior chemotherapy  He notes gradually change in chest and back curvature in the past year.     No other aggravating or relieving factors. No other complaint.  Past Medical History:  Diagnosis Date  . Diabetes mellitus without complication ([place])   . GERD (gastroesophageal reflux disease)   . Hypertension   . Kyphosis (acquired) (postural)      Current Outpatient Prescriptions on File Prior to Visit  Medication Sig Dispense Refill  . amLODipine (NORVASC) 10 MG tablet TAKE 1 TABLET EVERY DAY 90 tablet 3  . lisinopril (PRINIVIL,ZESTRIL) 40 MG tablet Take 1 tablet (40 mg total) by mouth daily. 90 tablet 3  . metFORMIN (GLUCOPHAGE) 500 MG tablet Take 1 tablet (500 mg total) by mouth 2 (two) times daily. 180 tablet 0  . Multiple Vitamins-Minerals (MULTIVITAMIN WITH MINERALS) tablet Take 1 tablet by mouth daily.    Marland Kitchen omeprazole (PRILOSEC) 20 MG capsule TAKE 1 CAPSULE EVERY DAY 90 capsule 3  . pioglitazone (ACTOS) 30 MG tablet 1 tablet daily 90 tablet 3  . simvastatin (ZOCOR) 20 MG tablet Take 1 tablet (20 mg total) by mouth daily at 6 PM. 90 tablet 3  . atenolol (TENORMIN) 100 MG tablet Take 1 tablet (100 mg total) by mouth daily.  (Patient not taking: Reported on 03/25/2016) 90 tablet 1  . dexlansoprazole (DEXILANT) 60 MG capsule Take 1 capsule (60 mg total) by mouth daily. (Patient not taking: Reported on 03/25/2016) 5 capsule 0   No current facility-administered medications on file prior to visit.     Objective: BP 120/70   Pulse 62   Resp 12   Wt 212 lb (96.2 kg)   SpO2 97%   BMI 26.50 kg/m    Wt Readings from Last 3 Encounters:  03/25/16 212 lb (96.2 kg)  02/16/16 211 lb (95.7 kg)  02/11/16 210 lb 3.2 oz (95.3 kg)   BP Readings from Last 3 Encounters:  03/25/16 120/70  02/16/16 140/80  02/11/16 140/82   Gen: wd, wn, nad Lungs clear Heart RRR, normal s1, s2, no murmurs Marked kyphosis of T spine.   Back non tender Chest quite tender over upper sternum, otherwise nontneder Abdomen: +bs, soft, nontneder, no mass, no organomegaly No ext edema Pulses 2+ UE and LE Neck: nontneder, good ROM, no mass   Assessment: Encounter Diagnoses  Name Primary?  . Other chest pain Yes  . Spine deformity   . Need for prophylactic vaccination and inoculation against influenza   . Sternal pain     Plan Chest pain/chest wall pain - will send for CXR.  Reviewed most recent labs and EKG in  chart from august 2017, reviewed the 2 prior visits in August here for the same.   I suspect this is more due to T spine changes with age and kyphosis.  Consider PT going forward.    Counseled on the influenza virus vaccine.  Vaccine information sheet given.   High dose Influenza vaccine given after consent obtained.  Alexander Landry was seen today for chest pain.  Diagnoses and all orders for this visit:  Other chest pain -     Cancel: DG Thoracic Spine 2 View; Future -     DG Ribs Bilateral W/Chest; Future  Spine deformity -     Cancel: DG Thoracic Spine 2 View; Future -     DG Ribs Bilateral W/Chest; Future  Need for prophylactic vaccination and inoculation against influenza -     Flu vaccine HIGH DOSE PF (Fluzone High  dose)  Sternal pain

## 2016-03-26 ENCOUNTER — Telehealth: Payer: Self-pay | Admitting: Medical

## 2016-03-26 MED ORDER — TRAMADOL HCL 50 MG PO TABS: 50.0000 mg | ORAL_TABLET | Freq: Four times a day (QID) | ORAL | 0 refills | Status: DC | PRN

## 2016-03-26 NOTE — Telephone Encounter (Signed)
Pt called for X-ray results.  Please call him

## 2016-03-26 NOTE — Telephone Encounter (Signed)
Ultram called to [name] at [facility]. Alexander Landry

## 2016-04-05 ENCOUNTER — Telehealth: Payer: Self-pay | Admitting: Family Medicine

## 2016-04-05 ENCOUNTER — Ambulatory Visit
Admission: RE | Admit: 2016-04-05 | Discharge: 2016-04-05 | Disposition: A | Discharge status: Home / Self Care | Payer: Commercial Managed Care - HMO | Source: Ambulatory Visit | Attending: Family Medicine | Admitting: Family Medicine

## 2016-04-05 ENCOUNTER — Ambulatory Visit (INDEPENDENT_AMBULATORY_CARE_PROVIDER_SITE_OTHER): Payer: Commercial Managed Care - HMO | Admitting: Family Medicine

## 2016-04-05 ENCOUNTER — Telehealth: Payer: Self-pay

## 2016-04-05 ENCOUNTER — Encounter: Payer: Self-pay | Admitting: Family Medicine

## 2016-04-05 VITALS — BP 120/88 | HR 60 | Wt 209.0 lb

## 2016-04-05 DIAGNOSIS — R079 Chest pain, unspecified: Secondary | ICD-10-CM | POA: Diagnosis not present

## 2016-04-05 DIAGNOSIS — E118 Type 2 diabetes mellitus with unspecified complications: Secondary | ICD-10-CM

## 2016-04-05 MED ORDER — METFORMIN HCL 500 MG PO TABS: 500.0000 mg | ORAL_TABLET | Freq: Two times a day (BID) | ORAL | 0 refills | Status: DC

## 2016-04-05 NOTE — Progress Notes (Signed)
   Subjective:    Patient ID: Alexander Landry, male    DOB: [birth year], 82 y.o.   MRN: [id number]  HPI He has been seen several times now for evaluation of chest pain. Is mainly on the left side and is constant. Unrelated to physical activity. He has had no cough, congestion, shortness of breath, nausea, vomiting,fever, chills. No pain on motion of the chest, arms or back. He cannot relate this to food. He is on Prilosec stating it helps with his swallowing however.   Review of Systems     Objective:   Physical Exam Alert and in no distress. Tympanic membranes and canals are normal. Pharyngeal area is normal. Neck is supple without adenopathy or thyromegaly. Cardiac exam shows a regular sinus rhythm without murmurs or gallops. Lungs are clear to auscultation. He does complain of some chest wall discomfort with compression but states it is different than the other pain he is having X-rays were reviewed as well as blood work. Previous encounters were also reviewed.      Assessment & Plan:  Chest pain, unspecified chest pain type - Plan: DG Chest 2 View The etiology of this is unclear. I will get a chest x-ray to see if anything shows up there. It does not point towards cardiac, GI, pulmonary, orthopedic at this point.

## 2016-04-05 NOTE — Telephone Encounter (Signed)
Pt's wife called and stated that when pt got over to imaging they stated he had this xray last week and didn't take it again. Please advise pt.

## 2016-04-05 NOTE — Telephone Encounter (Signed)
Pt called said he was unclear as to what is next?  Please call pt

## 2016-04-05 NOTE — Telephone Encounter (Signed)
Let him know that they did indeed do a chest x-ray which did not show anything. Let send him back to GI and see if they can help.

## 2016-04-05 NOTE — Telephone Encounter (Signed)
Pt request refill of Metformin. Uses [facility] pharmacy, but needs 30 day supply called to [facility] Alexander Landry

## 2016-04-06 ENCOUNTER — Encounter: Payer: Self-pay | Admitting: Gastroenterology

## 2016-04-06 NOTE — Telephone Encounter (Signed)
Pt's wife informed of results and need of referral. Referral entered to GI.

## 2016-04-07 ENCOUNTER — Telehealth: Payer: Self-pay | Admitting: Family Medicine

## 2016-04-07 ENCOUNTER — Other Ambulatory Visit: Payer: Self-pay | Admitting: Medical

## 2016-04-07 NOTE — Telephone Encounter (Signed)
Pt called to refill of Tramadol.  His procedure is next week.

## 2016-04-08 NOTE — Telephone Encounter (Signed)
I have called this in

## 2016-04-08 NOTE — Telephone Encounter (Signed)
ok 

## 2016-04-08 NOTE — Telephone Encounter (Signed)
I think he just took care of this

## 2016-04-08 NOTE — Telephone Encounter (Signed)
Wife called back stating refill not at pharmacy.  Please advise

## 2016-04-08 NOTE — Telephone Encounter (Signed)
Is this okay to refill? 

## 2016-04-08 NOTE — Telephone Encounter (Signed)
Refill request of ultram requested by pt.  Orignial rx by Audelia Acton when you were out of town. Victorino December

## 2016-04-12 ENCOUNTER — Telehealth: Payer: Self-pay

## 2016-04-12 ENCOUNTER — Other Ambulatory Visit: Payer: Self-pay

## 2016-04-12 MED ORDER — ATENOLOL 50 MG PO TABS: 50.0000 mg | ORAL_TABLET | Freq: Two times a day (BID) | ORAL | 11 refills | Status: DC

## 2016-04-12 NOTE — Telephone Encounter (Signed)
[facility]

## 2016-04-12 NOTE — Telephone Encounter (Signed)
[facility] [place] [facility]

## 2016-04-13 NOTE — Telephone Encounter (Deleted)
90

## 2016-04-14 ENCOUNTER — Ambulatory Visit (INDEPENDENT_AMBULATORY_CARE_PROVIDER_SITE_OTHER): Payer: Commercial Managed Care - HMO | Admitting: Gastroenterology

## 2016-04-14 ENCOUNTER — Other Ambulatory Visit (INDEPENDENT_AMBULATORY_CARE_PROVIDER_SITE_OTHER): Payer: Commercial Managed Care - HMO

## 2016-04-14 ENCOUNTER — Encounter: Payer: Self-pay | Admitting: Gastroenterology

## 2016-04-14 VITALS — BP 142/70 | HR 72 | Ht 75.0 in | Wt 208.6 lb

## 2016-04-14 DIAGNOSIS — R079 Chest pain, unspecified: Secondary | ICD-10-CM

## 2016-04-14 DIAGNOSIS — K219 Gastro-esophageal reflux disease without esophagitis: Secondary | ICD-10-CM

## 2016-04-14 LAB — BUN: BUN: 13 mg/dL (ref 6–23)

## 2016-04-14 LAB — CREATININE, SERUM: Creatinine, Ser: 0.92 mg/dL (ref 0.40–1.50)

## 2016-04-14 NOTE — Progress Notes (Signed)
Make sure he is scheduled for a chest CT to evaluate chest pain

## 2016-04-14 NOTE — Patient Instructions (Addendum)
Your physician has requested that you go to the basement for lab work before leaving today.  You have been scheduled for a CT [facility] of the chest at [facility] ([address]---this is in the same building as [facility]).   You are scheduled on 04/22/16 at 11:30am. You should arrive 15 minutes prior to your appointment time for registration. Please follow the written instructions below on the day of your exam:  WARNING: IF YOU ARE ALLERGIC TO IODINE/X-RAY DYE, PLEASE NOTIFY RADIOLOGY IMMEDIATELY AT [phone]! YOU WILL BE GIVEN A 13 HOUR PREMEDICATION PREP.  1) Do not eat or drink anything after 9:30am (2 hours prior to your test)  You may take any medications as prescribed with a small amount of water except for the following: Metformin, Glucophage, Glucovance, Avandamet, Riomet, Fortamet, Actoplus Met, Janumet, Glumetza or Metaglip. The above medications must be held the day of the exam AND 48 hours after the exam.  The purpose of you drinking the oral contrast is to aid in the visualization of your intestinal tract. The contrast solution may cause some diarrhea. Before your exam is started, you will be given a small amount of fluid to drink. Depending on your individual set of symptoms, you may also receive an intravenous injection of x-ray contrast/dye. Plan on being at [facility] for 30 minutes or longer, depending on the type of exam you are having performed.  This test typically takes 30-45 minutes to complete.  If you have any questions regarding your exam or if you need to reschedule, you may call the CT department at [phone] between the hours of 8:00 am and 5:00 pm, Monday-Friday.  ________________________________________________________________________   Follow up with your follow up primary care physician. No GI follow up needed.   Thank you for choosing me and [facility].  Pricilla Riffle. Dagoberto Ligas., MD., Marval Regal

## 2016-04-14 NOTE — Progress Notes (Signed)
    History of Present Illness: This is an 82 year old male referred by Denita Lung, MD for the evaluation of chest pain. He is accompanied by his wife. He has had chronic GERD with typical heartburn symptoms which are very well controlled on daily omeprazole. For the past several weeks he has had a persistent left chest achy pain that is unchanged with meals, bowel movements, exertion, positioning, deep breath, cough or any other activity. It is partially relieved with Ultram and Tylenol. Patient states it feels nothing like his typical heartburn which has been well controlled for several years. CBC and CMP in August were unremarkable. Thoracic spine and bilateral rib films with chest x-ray performed on September 14 showed mild degenerative changes in his thoracic spine, osteopenic ribs without acute fracture noted, mildly hyperinflated lungs and mild interstitial changes suggestive of COPD/chronic bronchitis. The patient thinks he had a colonoscopy performed about 10 years ago but does not recall any information about it. No information regarding any prior GI evaluation is available in Epic. Denies weight loss, abdominal pain, constipation, diarrhea, change in stool caliber, melena, hematochezia, nausea, vomiting, dysphagia, reflux symptoms.  Review of Systems: Pertinent positive and negative review of systems were noted in the above HPI section. All other review of systems were otherwise negative.  Current Medications, Allergies, Past Medical History, Past Surgical History, Family History and Social History were reviewed in [employer] record.  Physical Exam: General: Well developed, well nourished, elderly, no acute distress Head: Normocephalic and atraumatic Eyes:  sclerae anicteric, EOMI Ears: Normal auditory acuity Mouth: No deformity or lesions Neck: Supple, no masses or thyromegaly Lungs: Clear throughout to auscultation. No chest wall tenderness appreciated Heart:  Regular rate and rhythm; no murmurs, rubs or bruits Abdomen: Soft, non tender and non distended. No masses, hepatosplenomegaly or hernias noted. Normal Bowel sounds Musculoskeletal: Symmetrical with no gross deformities  Skin: No lesions on visible extremities Pulses:  Normal pulses noted Extremities: No clubbing, cyanosis, edema or deformities noted Neurological: Alert oriented x 4, grossly nonfocal Cervical Nodes:  No significant cervical adenopathy Inguinal Nodes: No significant inguinal adenopathy Psychological:  Alert and cooperative. Normal mood and affect  Assessment and Recommendations:  1. Left sided chest pain. This pain does not appear to be GI related. No gastrointestinal evaluation is recommended at this time. Continue the evaluation with a chest CT scan. Continue Tylenol and Ultram for management of chest pain. Return to Dr. Redmond School for ongoing evaluation and mgmt.  2. GERD. Well controlled on daily omeprazole. Continue omeprazole 20 mg daily and standard antireflux measures.   cc: Denita Lung, MD [address] [place], [place] [address]

## 2016-04-15 ENCOUNTER — Telehealth: Payer: Self-pay | Admitting: Family Medicine

## 2016-04-15 ENCOUNTER — Other Ambulatory Visit: Payer: Self-pay

## 2016-04-15 MED ORDER — TRAMADOL HCL 50 MG PO TABS: 100.0000 mg | ORAL_TABLET | Freq: Four times a day (QID) | ORAL | 0 refills | Status: DC | PRN

## 2016-04-15 NOTE — Telephone Encounter (Signed)
Spoke to wife and she states that they set up CT for next week due to pt's insurance for prior Authorization. Pt is in pain and wants to know if anything else can be given to help with pain. Pt's wife was advised to call GI to see if prior Josem Kaufmann can be done sooner and get earlier appt but not sure wife will do that as she was "not happy how they acted toward her and him yesterday"

## 2016-04-15 NOTE — Telephone Encounter (Signed)
They can take 100 mg of tramadol every 6 hours and if they have done that then the only thing we have is codeine and I would like to avoid that but will write a prescription if they want

## 2016-04-15 NOTE — Telephone Encounter (Signed)
Pt's wife called and stated that at his GI appt yesterday they stated no further GI follow up needed but are sending him for a CT. She states that pt woke up again this morning and is in pain. She is frustrated because he continues to have pain and tests but nothing seems to be helping. Pt's CT is not until 10/12. Please call Kennyth Lose at [phone] and advise. Sending this as high priority as she states pt is still in considerable pain.

## 2016-04-15 NOTE — Telephone Encounter (Signed)
Called in tramadol per [employer]

## 2016-04-15 NOTE — Telephone Encounter (Signed)
Pt informed and med called in

## 2016-04-15 NOTE — Telephone Encounter (Signed)
See if you can help get his CT set up quicker

## 2016-04-22 ENCOUNTER — Ambulatory Visit (INDEPENDENT_AMBULATORY_CARE_PROVIDER_SITE_OTHER)
Admission: RE | Admit: 2016-04-22 | Discharge: 2016-04-22 | Disposition: A | Discharge status: Home / Self Care | Payer: Commercial Managed Care - HMO | Source: Ambulatory Visit | Attending: Gastroenterology | Admitting: Gastroenterology

## 2016-04-22 DIAGNOSIS — R079 Chest pain, unspecified: Secondary | ICD-10-CM | POA: Diagnosis not present

## 2016-04-22 MED ORDER — IOPAMIDOL (ISOVUE-300) INJECTION 61%
80.0000 mL | Freq: Once | INTRAVENOUS | Status: AC | PRN
  Administered 2016-04-22: 80 mL via INTRAVENOUS

## 2016-04-23 ENCOUNTER — Ambulatory Visit (INDEPENDENT_AMBULATORY_CARE_PROVIDER_SITE_OTHER): Payer: Commercial Managed Care - HMO | Admitting: Emergency Medicine

## 2016-04-23 ENCOUNTER — Encounter: Payer: Self-pay | Admitting: Emergency Medicine

## 2016-04-23 VITALS — BP 102/60 | HR 62 | Ht 75.0 in | Wt 207.0 lb

## 2016-04-23 DIAGNOSIS — R0789 Other chest pain: Secondary | ICD-10-CM | POA: Diagnosis not present

## 2016-04-23 DIAGNOSIS — R918 Other nonspecific abnormal finding of lung field: Secondary | ICD-10-CM | POA: Insufficient documentation

## 2016-04-23 NOTE — Patient Instructions (Signed)
We will perform a PET scan We will refer you for a needle biopsy of your right lower lobe pulmonary nodule.  Follow with Dr Lamonte Sakai in 1 month

## 2016-04-23 NOTE — Assessment & Plan Note (Signed)
Right-sided pulmonary nodules with the predominant nodule being in the posterior right lower lobe. He also has a right middle lobe nodule that is smaller. And some right upper lobe groundglass nodules of unclear significance. I believe he needs a PET scan and then a needle biopsy of the right lower lobe nodule assuming there is no better target to approach.

## 2016-04-23 NOTE — Progress Notes (Signed)
Subjective:    Patient ID: Alexander Landry, male    DOB: [birth year], 82 y.o.   MRN: [id number]  HPI 82 yo former smoker (40 pack-yrs) with GERD, DM, HTN. 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 He has been evaluated by Dr. Fuller Plan for GERD which was felt to be causative of his chest pain. Part of the evaluation included a CT scan of his chest that was done on 12/17. I have personally reviewed the films. This shows some mild emphysematous change as well as a irregular 2.3 x 1.7 cm right lower lobe nodule and 8 mm spiculated right middle lobe nodule. Noted are some less well formed right upper lobe groundglass nodular infiltrates of unclear significance. He is referred for evaluation of the newly identified nodules. He is not on anticoagulation.    Review of Systems  Constitutional: Negative.  Negative for fever and unexpected weight change.  HENT: Negative.  Negative for congestion, dental problem, ear pain, nosebleeds, postnasal drip, rhinorrhea, sinus pressure, sneezing, sore throat and trouble swallowing.   Eyes: Negative.  Negative for redness and itching.  Respiratory: Positive for chest tightness. Negative for cough, shortness of breath and wheezing.   Cardiovascular: Negative.  Negative for palpitations and leg swelling.  Gastrointestinal: Negative.  Negative for nausea and vomiting.  Endocrine: Negative.   Genitourinary: Negative.  Negative for dysuria.  Musculoskeletal: Negative.  Negative for joint swelling.  Skin: Negative.  Negative for rash.  Allergic/Immunologic: Negative.   Neurological: Positive for dizziness. Negative for headaches.  Hematological: Negative.  Does not bruise/bleed easily.  Psychiatric/Behavioral: Negative.  Negative for dysphoric mood. The patient is not nervous/anxious.     Past Medical History:  Diagnosis Date  . Diabetes mellitus without complication ([place])   . GERD (gastroesophageal reflux disease)   . Hypertension   . Kyphosis  (acquired) (post[place])      Family History  Problem Relation Age of Onset  . Kidney cancer Mother      Social History   Social History  . Marital status: Married    Spouse name: N/A  . Number of children: N/A  . Years of education: N/A   Occupational History  . Not on file.   Social History Main Topics  . Smoking status: Former Research scientist (life sciences)  . Smokeless tobacco: Never Used  . Alcohol use No  . Drug use: No  . Sexual activity: Not Currently   Other Topics Concern  . Not on file   Social History Narrative  . No narrative on file     No Known Allergies   Outpatient Medications Prior to Visit  Medication Sig Dispense Refill  . amLODipine (NORVASC) 10 MG tablet TAKE 1 TABLET EVERY DAY 90 tablet 3  . atenolol (TENORMIN) 50 MG tablet Take 1 tablet (50 mg total) by mouth 2 (two) times daily. 60 tablet 11  . lisinopril (PRINIVIL,ZESTRIL) 40 MG tablet Take 1 tablet (40 mg total) by mouth daily. 90 tablet 3  . metFORMIN (GLUCOPHAGE) 500 MG tablet Take 1 tablet (500 mg total) by mouth 2 (two) times daily. 180 tablet 0  . Multiple Vitamins-Minerals (MULTIVITAMIN WITH MINERALS) tablet Take 1 tablet by mouth daily.    Marland Kitchen omeprazole (PRILOSEC) 20 MG capsule TAKE 1 CAPSULE EVERY DAY 90 capsule 3  . pioglitazone (ACTOS) 30 MG tablet 1 tablet daily 90 tablet 3  . simvastatin (ZOCOR) 20 MG tablet Take 1 tablet (20 mg total) by mouth daily at 6 PM. 90 tablet 3  .  traMADol (ULTRAM) 50 MG tablet Take 2 tablets (100 mg total) by mouth every 6 (six) hours as needed. 60 tablet 0   No facility-administered medications prior to visit.         Objective:   Physical Exam  Vitals:   04/23/16 1520  BP: 102/60  Pulse: 62  SpO2: 97%  Weight: 207 lb (93.9 kg)  Height: '6\' 3"'$  (1.905 m)   Gen: Pleasant, elderly kyphotic man, in no distress,  normal affect  ENT: No lesions,  mouth clear,  oropharynx clear, no postnasal drip  Neck: No JVD, no TMG, no carotid bruits  Lungs: No use of accessory  muscles, clear without rales or rhonchi  Cardiovascular: RRR, heart sounds normal, no murmur or gallops, no peripheral edema  Musculoskeletal: No deformities, sternum is very tender to palpation  Neuro: alert, non focal  Skin: Warm, no lesions or rashes  04/22/16 --  FINDINGS: Cardiovascular: Borderline cardiomegaly. Three-vessel coronary artery calcification. Aortic atherosclerosis.  Mediastinum/Nodes: Mild right hilar lymphadenopathy is seen measuring 1.9 cm short axis on image 78/2. Mildly enlarged mediastinal lymph nodes seen in the subcarinal region measuring 1.1 cm on image 81/2. No other pathologically enlarged lymph nodes identified.  2.5 cm right thyroid lobe nodule seen, which is nonspecific.  Lungs/Pleura: Mild emphysema noted. Irregular pulmonary nodule seen in posterior right lower lobe which measures 2.3 x 1.7 cm on image 105/3. This is highly suspicious for primary bronchogenic carcinoma. An 8 mm spiculated nodule is seen in the right middle lobe on image 102/3. This is suspicious for a small synchronous bronchogenic carcinoma.  Several foci of ground-glass opacity are seen in the right upper lobe, largest measuring 1.1 x 2.4 cm on image 50/3.  Mild pleural-parenchymal scarring seen bilaterally. No evidence of pleural effusion.  Upper Abdomen: Unremarkable. Normal adrenal glands. Prior cholecystectomy.  Musculoskeletal: No suspicious bone lesions or other significant abnormality.  IMPRESSION: 2.3 cm irregular pulmonary nodule in posterior right lower lobe, highly suspicious for primary bronchogenic carcinoma. 8 mm spiculated nodule in the right middle lobe, suspicious for a synchronous bronchogenic carcinoma. PET-CT scan recommended for further evaluation.  Mild right hilar and subcarinal lymphadenopathy, suspicious for metastatic disease.  Several foci of ground-glass opacity in the right upper lobe, largest measuring 1.1 x 2.4 cm.  Low-grade adenocarcinoma cannot be excluded. Recommend continued attention on follow-up CT.  Aortic and coronary artery atherosclerosis.  Mild emphysema.      Assessment & Plan:  Lung nodules Right-sided pulmonary nodules with the predominant nodule being in the posterior right lower lobe. He also has a right middle lobe nodule that is smaller. And some right upper lobe groundglass nodules of unclear significance. I believe he needs a PET scan and then a needle biopsy of the right lower lobe nodule assuming there is no better target to approach.  Sternal pain Painful to palp - believe that this explains his CP  Baltazar Apo, MD, PhD 04/23/2016, 3:42 PM [facility] [phone] or if no answer [phone]

## 2016-04-23 NOTE — Assessment & Plan Note (Signed)
Painful to palp - believe that this explains his CP

## 2016-04-26 ENCOUNTER — Telehealth: Payer: Self-pay | Admitting: Family Medicine

## 2016-04-26 MED ORDER — TRAMADOL HCL 50 MG PO TABS: 100.0000 mg | ORAL_TABLET | Freq: Four times a day (QID) | ORAL | 0 refills | Status: DC | PRN

## 2016-04-26 NOTE — Telephone Encounter (Signed)
Call in the tramadol and let her know that I have been monitoring him in my computer aided if they ever have questions, tell them do not hesitate to call me.

## 2016-04-26 NOTE — Telephone Encounter (Signed)
Called into [facility] with pharmacist Suanne Marker. Alexander Landry

## 2016-04-26 NOTE — Telephone Encounter (Signed)
Wife called for refill Tramadol to [facility] & wanted you to know he had a scan last week & is scheduled for a PET scan on 23rd

## 2016-04-28 ENCOUNTER — Telehealth: Payer: Self-pay

## 2016-04-28 MED ORDER — HYDROCODONE-ACETAMINOPHEN 5-325 MG PO TABS: ORAL_TABLET | ORAL | 0 refills | Status: DC

## 2016-04-28 NOTE — Telephone Encounter (Signed)
Dr Redmond School is out of the office and requested that I prescribe patient pain medication. He will follow up with Dr. Redmond School.

## 2016-04-28 NOTE — Telephone Encounter (Signed)
Pt aware script for Norco placed in basket at front desk for pick up. Alexander Landry

## 2016-04-28 NOTE — Telephone Encounter (Signed)
Pt called the office- he reports that he is in pain. Pt states that he has taken 2 tylenol and '100mg'$  of tramadol with no relief. He is requesting something stronger for pain.    Called [facility] in regards to pt request- he would like [place] to write script for Norco 5/325 1-2 tablets prn q 6-8 as needed. Victorino December

## 2016-05-03 ENCOUNTER — Encounter (HOSPITAL_COMMUNITY)
Admission: RE | Admit: 2016-05-03 | Discharge: 2016-05-03 | Disposition: A | Discharge status: Home / Self Care | Payer: Commercial Managed Care - HMO | Source: Ambulatory Visit | Attending: Emergency Medicine | Admitting: Emergency Medicine

## 2016-05-03 ENCOUNTER — Telehealth: Payer: Self-pay | Admitting: Family Medicine

## 2016-05-03 ENCOUNTER — Encounter (HOSPITAL_COMMUNITY): Payer: Commercial Managed Care - HMO

## 2016-05-03 DIAGNOSIS — R918 Other nonspecific abnormal finding of lung field: Secondary | ICD-10-CM | POA: Diagnosis not present

## 2016-05-03 LAB — GLUCOSE, CAPILLARY: Glucose-Capillary: 177 mg/dL — ABNORMAL HIGH (ref 65–99)

## 2016-05-03 MED ORDER — FLUDEOXYGLUCOSE F - 18 (FDG) INJECTION
11.7000 | Freq: Once | INTRAVENOUS | Status: AC | PRN
  Administered 2016-05-03: 11.7 via INTRAVENOUS

## 2016-05-03 MED ORDER — HYDROCODONE-ACETAMINOPHEN 5-325 MG PO TABS: ORAL_TABLET | ORAL | 0 refills | Status: DC

## 2016-05-03 NOTE — Telephone Encounter (Signed)
Informed pt wife that rx was ready to be picked up

## 2016-05-03 NOTE — Telephone Encounter (Signed)
Pt asking for refill Hydrocodone/acet, states going to [facility] for PET scan today

## 2016-05-06 ENCOUNTER — Other Ambulatory Visit: Payer: Self-pay | Admitting: Radiology

## 2016-05-06 ENCOUNTER — Telehealth: Payer: Self-pay | Admitting: Emergency Medicine

## 2016-05-06 ENCOUNTER — Encounter: Payer: Self-pay | Admitting: Emergency Medicine

## 2016-05-06 ENCOUNTER — Telehealth: Payer: Self-pay | Admitting: Family Medicine

## 2016-05-06 NOTE — Telephone Encounter (Signed)
[place] wife called and pt will be out of pain medicine by the weekend. Pt is in a lot of pain. HE is going Monday for a biopsy  Called and talked to [facility] [id number]. per [facility] it is ok to go ahead and write him another RX and he said to up his RX and  to write it for 7.5/325 50 tablets please call when ready to pick up,

## 2016-05-06 NOTE — Telephone Encounter (Signed)
Spoke with the patient to review his PET scan. Explained that the R nodule is hypermetabolic, that he has bony lesions as well. He is scheduled for needle biopsy on Monday 10/30. We will follow up to review path results when available.

## 2016-05-06 NOTE — Telephone Encounter (Signed)
Spoke with pt's wife. They are requesting results from pt's PET scan.  RB - please advise. Thanks.

## 2016-05-06 NOTE — Telephone Encounter (Signed)
Pt wife call again to get result of husbands scan please advise.Hillery Hunter

## 2016-05-07 ENCOUNTER — Other Ambulatory Visit: Payer: Self-pay | Admitting: Medical

## 2016-05-07 MED ORDER — HYDROCODONE-ACETAMINOPHEN 7.5-325 MG PO TABS: 1.0000 | ORAL_TABLET | Freq: Four times a day (QID) | ORAL | 0 refills | Status: DC | PRN

## 2016-05-07 NOTE — Telephone Encounter (Signed)
rx ready 

## 2016-05-07 NOTE — Telephone Encounter (Signed)
Informed pt that rx was ready to be picked up

## 2016-05-10 ENCOUNTER — Ambulatory Visit (HOSPITAL_COMMUNITY)
Admission: RE | Admit: 2016-05-10 | Discharge: 2016-05-10 | Disposition: A | Discharge status: Home / Self Care | Payer: Commercial Managed Care - HMO | Source: Ambulatory Visit | Attending: Diagnostic Radiology | Admitting: Diagnostic Radiology

## 2016-05-10 ENCOUNTER — Ambulatory Visit (HOSPITAL_COMMUNITY)
Admission: RE | Admit: 2016-05-10 | Discharge: 2016-05-10 | Disposition: A | Discharge status: Home / Self Care | Payer: Commercial Managed Care - HMO | Source: Ambulatory Visit | Attending: Emergency Medicine | Admitting: Emergency Medicine

## 2016-05-10 ENCOUNTER — Encounter (HOSPITAL_COMMUNITY): Payer: Self-pay

## 2016-05-10 DIAGNOSIS — I10 Essential (primary) hypertension: Secondary | ICD-10-CM | POA: Insufficient documentation

## 2016-05-10 DIAGNOSIS — M40209 Unspecified kyphosis, site unspecified: Secondary | ICD-10-CM | POA: Diagnosis not present

## 2016-05-10 DIAGNOSIS — J449 Chronic obstructive pulmonary disease, unspecified: Secondary | ICD-10-CM | POA: Insufficient documentation

## 2016-05-10 DIAGNOSIS — Z9889 Other specified postprocedural states: Secondary | ICD-10-CM | POA: Insufficient documentation

## 2016-05-10 DIAGNOSIS — I700 Atherosclerosis of aorta: Secondary | ICD-10-CM | POA: Diagnosis not present

## 2016-05-10 DIAGNOSIS — R590 Localized enlarged lymph nodes: Secondary | ICD-10-CM | POA: Diagnosis not present

## 2016-05-10 DIAGNOSIS — Z8571 Personal history of Hodgkin lymphoma: Secondary | ICD-10-CM | POA: Insufficient documentation

## 2016-05-10 DIAGNOSIS — K219 Gastro-esophageal reflux disease without esophagitis: Secondary | ICD-10-CM | POA: Diagnosis not present

## 2016-05-10 DIAGNOSIS — C3431 Malignant neoplasm of lower lobe, right bronchus or lung: Secondary | ICD-10-CM | POA: Insufficient documentation

## 2016-05-10 DIAGNOSIS — E119 Type 2 diabetes mellitus without complications: Secondary | ICD-10-CM | POA: Insufficient documentation

## 2016-05-10 DIAGNOSIS — R918 Other nonspecific abnormal finding of lung field: Secondary | ICD-10-CM | POA: Insufficient documentation

## 2016-05-10 DIAGNOSIS — C3490 Malignant neoplasm of unspecified part of unspecified bronchus or lung: Secondary | ICD-10-CM

## 2016-05-10 DIAGNOSIS — Z87891 Personal history of nicotine dependence: Secondary | ICD-10-CM | POA: Insufficient documentation

## 2016-05-10 DIAGNOSIS — I2510 Atherosclerotic heart disease of native coronary artery without angina pectoris: Secondary | ICD-10-CM | POA: Diagnosis not present

## 2016-05-10 DIAGNOSIS — C8190 Hodgkin lymphoma, unspecified, unspecified site: Secondary | ICD-10-CM | POA: Insufficient documentation

## 2016-05-10 HISTORY — DX: Malignant neoplasm of unspecified part of unspecified bronchus or lung: C34.90

## 2016-05-10 HISTORY — DX: Other specified health status: Z78.9

## 2016-05-10 LAB — CBC WITH DIFFERENTIAL/PLATELET
BASOS ABS: 0.1 10*3/uL (ref 0.0–0.1)
BASOS PCT: 1 %
Eosinophils Absolute: 0.2 10*3/uL (ref 0.0–0.7)
Eosinophils Relative: 2 %
HEMATOCRIT: 39.1 % (ref 39.0–52.0)
Hemoglobin: 13.5 g/dL (ref 13.0–17.0)
Lymphocytes Relative: 35 %
Lymphs Abs: 3.9 10*3/uL (ref 0.7–4.0)
MCH: 32.1 pg (ref 26.0–34.0)
MCHC: 34.5 g/dL (ref 30.0–36.0)
MCV: 92.9 fL (ref 78.0–100.0)
MONO ABS: 1.1 10*3/uL — AB (ref 0.1–1.0)
Monocytes Relative: 10 %
NEUTROS ABS: 5.8 10*3/uL (ref 1.7–7.7)
Neutrophils Relative %: 52 %
PLATELETS: 321 10*3/uL (ref 150–400)
RBC: 4.21 MIL/uL — AB (ref 4.22–5.81)
RDW: 15.2 % (ref 11.5–15.5)
WBC: 11.2 10*3/uL — AB (ref 4.0–10.5)

## 2016-05-10 LAB — BASIC METABOLIC PANEL
ANION GAP: 11 (ref 5–15)
BUN: 15 mg/dL (ref 6–20)
CALCIUM: 9.6 mg/dL (ref 8.9–10.3)
CO2: 16 mmol/L — ABNORMAL LOW (ref 22–32)
Chloride: 111 mmol/L (ref 101–111)
Creatinine, Ser: 0.98 mg/dL (ref 0.61–1.24)
GLUCOSE: 155 mg/dL — AB (ref 65–99)
POTASSIUM: 3.7 mmol/L (ref 3.5–5.1)
SODIUM: 138 mmol/L (ref 135–145)

## 2016-05-10 LAB — GLUCOSE, CAPILLARY: GLUCOSE-CAPILLARY: 100 mg/dL — AB (ref 65–99)

## 2016-05-10 LAB — PROTIME-INR
INR: 1.12
Prothrombin Time: 14.4 seconds (ref 11.4–15.2)

## 2016-05-10 MED ORDER — FENTANYL CITRATE (PF) 100 MCG/2ML IJ SOLN
INTRAMUSCULAR | Status: AC | PRN
  Administered 2016-05-10: 25 ug via INTRAVENOUS

## 2016-05-10 MED ORDER — MIDAZOLAM HCL 2 MG/2ML IJ SOLN
INTRAMUSCULAR | Status: AC | PRN
  Administered 2016-05-10: 1 mg via INTRAVENOUS

## 2016-05-10 MED ORDER — HYDROCODONE-ACETAMINOPHEN 5-325 MG PO TABS
1.0000 | ORAL_TABLET | ORAL | Status: DC | PRN
  Administered 2016-05-10: 2 via ORAL
  Filled 2016-05-10: qty 2

## 2016-05-10 MED ORDER — FENTANYL CITRATE (PF) 100 MCG/2ML IJ SOLN
25.0000 ug | Freq: Once | INTRAMUSCULAR | Status: AC
  Administered 2016-05-10: 25 ug via INTRAVENOUS
  Filled 2016-05-10: qty 0.5

## 2016-05-10 MED ORDER — HYDROCODONE-ACETAMINOPHEN 5-325 MG PO TABS
ORAL_TABLET | ORAL | Status: AC
  Filled 2016-05-10: qty 2

## 2016-05-10 MED ORDER — SODIUM CHLORIDE 0.9 % IV SOLN
INTRAVENOUS | Status: DC
  Administered 2016-05-10: 10:00:00 via INTRAVENOUS

## 2016-05-10 MED ORDER — FENTANYL CITRATE (PF) 100 MCG/2ML IJ SOLN
INTRAMUSCULAR | Status: AC
  Filled 2016-05-10: qty 2

## 2016-05-10 MED ORDER — MIDAZOLAM HCL 2 MG/2ML IJ SOLN
INTRAMUSCULAR | Status: AC
  Filled 2016-05-10: qty 2

## 2016-05-10 MED ORDER — LIDOCAINE HCL 1 % IJ SOLN
INTRAMUSCULAR | Status: AC
  Filled 2016-05-10: qty 20

## 2016-05-10 NOTE — Discharge Instructions (Signed)

## 2016-05-10 NOTE — Procedures (Signed)
Successful CT guided core biopsies of RLL lesion.  2 cores obtained.  Minimal bleeding and no immediate complication.  Plan for CXR in one hour.  See full report in PACS.

## 2016-05-10 NOTE — Progress Notes (Signed)
Pt transported to radiology department, transporter informed of increased pain as no call back received from PA.

## 2016-05-10 NOTE — Sedation Documentation (Addendum)
Patient is resting

## 2016-05-10 NOTE — H&P (Signed)
Chief Complaint: Patient was seen in consultation today for right lung mass biopsy at the request of Kipton S  Referring Physician(s): Lott S  Supervising Physician: Markus Daft  Patient Status: [facility] - Out-pt  History of Present Illness: Alexander Landry is a 82 y.o. male   Pt with remote Hodgkin's Lymphoma (20 yrs ago) New chest pain over last few weeks Work up reveals: IMPRESSION: 10/12 CT 2.3 cm irregular pulmonary nodule in posterior right lower lobe, highly suspicious for primary bronchogenic carcinoma. 8 mm spiculated nodule in the right middle lobe, suspicious for a synchronous bronchogenic carcinoma. PET-CT scan recommended for further evaluation. Mild right hilar and subcarinal lymphadenopathy, suspicious for metastatic disease. Several foci of ground-glass opacity in the right upper lobe, largest measuring 1.1 x 2.4 cm. Low-grade adenocarcinoma cannot be excluded. Recommend continued attention on follow-up CT. Aortic and coronary artery atherosclerosis.  Mild emphysema.  IMPRESSION: 1. Hypermetabolic 2.4 cm posterior right lower lobe pulmonary nodule, most consistent with primary bronchogenic carcinoma. 2. Hypermetabolic right hilar lymphadenopathy, consistent with nodal metastasis. No hypermetabolic mediastinal or contralateral hilar adenopathy. 3. Multifocal hypermetabolic osseous lesions throughout the axial skeleton and bilateral shoulder girdles, most prominent in the sternum and thoracic spine with associated faint sclerosis on the CT images, most consistent with multifocal osseous metastatic disease. 4. Subcentimeter right middle lobe pulmonary nodule, below PET resolution, indeterminate. Ill-defined ground-glass right upper lobe 2.3 cm pulmonary nodule with low level metabolism, cannot exclude indolent adenocarcinoma. Recommend follow-up of these nodules on chest CT in 3-6 months. 5. Additional findings include aortic atherosclerosis,  coronary atherosclerosis and mild emphysema.  Has been evaluated by Dr Teressa Senter biopsy Now scheduled for same  Past Medical History:  Diagnosis Date  . Diabetes mellitus without complication ([place])   . GERD (gastroesophageal reflux disease)   . Hypertension   . Kyphosis (acquired) (postural)   . Medical history non-contributory     History reviewed. No pertinent surgical history.  Allergies: Review of patient's allergies indicates no known allergies.  Medications: Prior to Admission medications   Medication Sig Start Date End Date Taking? Authorizing Provider  amLODipine (NORVASC) 10 MG tablet TAKE 1 TABLET EVERY DAY 02/16/16  Yes Denita Lung, MD  atenolol (TENORMIN) 50 MG tablet Take 1 tablet (50 mg total) by mouth 2 (two) times daily. 04/12/16  Yes Denita Lung, MD  HYDROcodone-acetaminophen (NORCO) 7.5-325 MG tablet Take 1 tablet by mouth every 6 (six) hours as needed for moderate pain. 05/07/16  Yes Camelia Eng Tysinger, PA-C  lisinopril (PRINIVIL,ZESTRIL) 40 MG tablet Take 1 tablet (40 mg total) by mouth daily. 03/01/16  Yes Denita Lung, MD  metFORMIN (GLUCOPHAGE) 500 MG tablet Take 1 tablet (500 mg total) by mouth 2 (two) times daily. 04/05/16  Yes Denita Lung, MD  Multiple Vitamins-Minerals (MULTIVITAMIN WITH MINERALS) tablet Take 1 tablet by mouth daily.   Yes Historical Provider, MD  omeprazole (PRILOSEC) 20 MG capsule TAKE 1 CAPSULE EVERY DAY 09/16/15  Yes Denita Lung, MD  pioglitazone (ACTOS) 30 MG tablet 1 tablet daily 09/16/15  Yes Denita Lung, MD  simvastatin (ZOCOR) 20 MG tablet Take 1 tablet (20 mg total) by mouth daily at 6 PM. 08/25/15  Yes Denita Lung, MD     Family History  Problem Relation Age of Onset  . Kidney cancer Mother     Social History   Social History  . Marital status: Married    Spouse name: N/A  . Number of children:  N/A  . Years of education: N/A   Social History Main Topics  . Smoking status: Former Smoker    Packs/day:  1.00    Years: 40.00    Types: Cigarettes    Quit date: 08/24/1985  . Smokeless tobacco: Never Used  . Alcohol use No  . Drug use: No  . Sexual activity: Not Currently   Other Topics Concern  . None   Social History Narrative  . None    Review of Systems: A 12 point ROS discussed and pertinent positives are indicated in the HPI above.  All other systems are negative.  Review of Systems  Constitutional: Positive for activity change. Negative for fatigue and fever.  Respiratory: Negative for shortness of breath.   Cardiovascular: Positive for chest pain.       Anterior chest wall pain  Gastrointestinal: Negative for abdominal pain.  Neurological: Positive for weakness.  Psychiatric/Behavioral: Negative for behavioral problems and confusion.    Vital Signs: BP 103/83   Pulse 88   Temp 97.6 F (36.4 C)   Resp 20   Ht '6\' 3"'$  (1.905 m)   Wt 207 lb (93.9 kg)   SpO2 96%   BMI 25.87 kg/m   Physical Exam  Constitutional: He is oriented to person, place, and time.  Cardiovascular: Normal rate and regular rhythm.   Pulmonary/Chest: Effort normal and breath sounds normal. He has no wheezes.  Abdominal: Soft. Bowel sounds are normal.  Musculoskeletal: Normal range of motion.  Neurological: He is alert and oriented to person, place, and time.  Skin: Skin is warm.  Psychiatric: He has a normal mood and affect. His behavior is normal. Judgment and thought content normal.  Nursing note and vitals reviewed.   Mallampati Score:  MD Evaluation Airway: WNL Heart: WNL Abdomen: WNL ASA  Classification: 3 Mallampati/Airway Score: One  Imaging: Ct Chest W Contrast  Result Date: 04/22/2016 CLINICAL DATA:  Left-sided chest pain for 2 months. Personal history of Hodgkin's lymphoma. EXAM: CT CHEST WITH CONTRAST TECHNIQUE: Multidetector CT imaging of the chest was performed during intravenous contrast administration. CONTRAST:  73m ISOVUE-300 IOPAMIDOL (ISOVUE-300) INJECTION 61%  COMPARISON:  None. FINDINGS: Cardiovascular: Borderline cardiomegaly. Three-vessel coronary artery calcification. Aortic atherosclerosis. Mediastinum/Nodes: Mild right hilar lymphadenopathy is seen measuring 1.9 cm short axis on image 78/2. Mildly enlarged mediastinal lymph nodes seen in the subcarinal region measuring 1.1 cm on image 81/2. No other pathologically enlarged lymph nodes identified. 2.5 cm right thyroid lobe nodule seen, which is nonspecific. Lungs/Pleura: Mild emphysema noted. Irregular pulmonary nodule seen in posterior right lower lobe which measures 2.3 x 1.7 cm on image 105/3. This is highly suspicious for primary bronchogenic carcinoma. An 8 mm spiculated nodule is seen in the right middle lobe on image 102/3. This is suspicious for a small synchronous bronchogenic carcinoma. Several foci of ground-glass opacity are seen in the right upper lobe, largest measuring 1.1 x 2.4 cm on image 50/3. Mild pleural-parenchymal scarring seen bilaterally. No evidence of pleural effusion. Upper Abdomen: Unremarkable. Normal adrenal glands. Prior cholecystectomy. Musculoskeletal: No suspicious bone lesions or other significant abnormality. IMPRESSION: 2.3 cm irregular pulmonary nodule in posterior right lower lobe, highly suspicious for primary bronchogenic carcinoma. 8 mm spiculated nodule in the right middle lobe, suspicious for a synchronous bronchogenic carcinoma. PET-CT scan recommended for further evaluation. Mild right hilar and subcarinal lymphadenopathy, suspicious for metastatic disease. Several foci of ground-glass opacity in the right upper lobe, largest measuring 1.1 x 2.4 cm. Low-grade adenocarcinoma cannot be excluded. Recommend  continued attention on follow-up CT. Aortic and coronary artery atherosclerosis.  Mild emphysema. These results will be called to the ordering clinician or representative by the Radiologist Assistant, and communication documented in the PACS or zVision Dashboard.  Electronically Signed   By: Earle Gell M.D.   On: 04/22/2016 14:55   Nm Pet Image Initial (pi) Skull Base To Thigh  Result Date: 05/03/2016 CLINICAL DATA:  Initial treatment strategy for right pulmonary nodules on recent chest CT performed in the setting of chest pain. Remote history of Hodgkin's lymphoma. Former smoker. EXAM: NUCLEAR MEDICINE PET SKULL BASE TO THIGH TECHNIQUE: 11.7 mCi F-18 FDG was injected intravenously. Full-ring PET imaging was performed from the skull base to thigh after the radiotracer. CT data was obtained and used for attenuation correction and anatomic localization. FASTING BLOOD GLUCOSE:  Value: 177 mg/dl COMPARISON:  04/22/2016 chest CT. FINDINGS: NECK No hypermetabolic lymph nodes in the neck. Relatively symmetric hypermetabolism in the glottis without CT correlate, favor physiologic uptake. Mildly enlarged heterogeneous thyroid gland with no hypermetabolic thyroid nodules. CHEST Left main, left anterior descending, left circumflex and right coronary atherosclerosis. Atherosclerotic nonaneurysmal thoracic aorta. No pleural effusions. Hypermetabolic posterior right lower lobe 2.4 x 1.5 cm pulmonary nodule (series 8/image 39) with max SUV 5.6, stable in size since 04/22/2016. Right middle lobe 7 mm solid pulmonary nodule (series 8/image 47), below PET resolution, not associated with significant metabolism. Ill-defined 2.6 x 1.2 cm right upper lobe ground-glass nodule (series 8/image 20) with associated low level metabolism (max SUV 2.3). No acute consolidative airspace disease or additional significant pulmonary nodules. Relatively symmetric mild reticulonodular opacities at the lung apices are consistent with pleural-parenchymal scarring. Mild centrilobular emphysema. Hypermetabolic right hilar adenopathy with max SUV 7.9, poorly delineated on the noncontrast CT images. No hypermetabolic axillary, mediastinal or left hilar nodes. ABDOMEN/PELVIS No abnormal hypermetabolic activity  within the liver, pancreas, adrenal glands, or spleen. No hypermetabolic lymph nodes in the abdomen or pelvis. Cholecystectomy. Simple 2.7 cm renal cyst in the lateral lower left kidney. Atherosclerotic nonaneurysmal abdominal aorta. SKELETON There is faintly sclerotic hypermetabolic sternal osseous lesion with max SUV 10.7. There are several hypermetabolic osseous lesions in the thoracolumbar spine with associated faint sclerosis on the CT images, for example max SUV 9.1 in the T11 vertebral body and max SUV 9.4 in the right L3 posterior elements. There are hypermetabolic osseous lesions in the bilateral shoulder girdles and right ribs, for example max SUV 7.1 in the right second rib and max SUV 7.1 in the left superior scapula. There are hypermetabolic skeletal foci in the left anterior acetabulum (max SUV 7.9) and medial right iliac bone. Multiple pins are seen in the right femoral neck. IMPRESSION: 1. Hypermetabolic 2.4 cm posterior right lower lobe pulmonary nodule, most consistent with primary bronchogenic carcinoma. 2. Hypermetabolic right hilar lymphadenopathy, consistent with nodal metastasis. No hypermetabolic mediastinal or contralateral hilar adenopathy. 3. Multifocal hypermetabolic osseous lesions throughout the axial skeleton and bilateral shoulder girdles, most prominent in the sternum and thoracic spine with associated faint sclerosis on the CT images, most consistent with multifocal osseous metastatic disease. 4. Subcentimeter right middle lobe pulmonary nodule, below PET resolution, indeterminate. Ill-defined ground-glass right upper lobe 2.3 cm pulmonary nodule with low level metabolism, cannot exclude indolent adenocarcinoma. Recommend follow-up of these nodules on chest CT in 3-6 months. 5. Additional findings include aortic atherosclerosis, coronary atherosclerosis and mild emphysema. Electronically Signed   By: Ilona Sorrel M.D.   On: 05/03/2016 16:47    Labs:  CBC:  Recent Labs   10/23/15 0001 02/11/16 1135  WBC 10.4 9.6  HGB 14.5 14.1  HCT 43.3 41.5  PLT 289 288    COAGS: No results for input(s): INR, APTT in the last 8760 hours.  BMP:  Recent Labs  10/23/15 0001 02/11/16 1135 04/14/16 0958  NA 140 138  --   K 4.0 4.5  --   CL 103 102  --   CO2 21 25  --   GLUCOSE 133* 111*  --   BUN '15 14 13  '$ CALCIUM 9.0 9.1  --   CREATININE 1.02 0.91 0.92    LIVER FUNCTION TESTS:  Recent Labs  10/23/15 0001 02/11/16 1135  BILITOT 1.0 0.7  AST 6* 7*  ALT 8* 9  ALKPHOS 65 75  PROT 6.8 7.0  ALBUMIN 4.1 4.0    TUMOR MARKERS: No results for input(s): AFPTM, CEA, CA199, CHROMGRNA in the last 8760 hours.  Assessment and Plan:  Hx Hodgkin's Lymphoma---20 yrs ago New chest pain Right lung mass; hilar LAN + PET Reviewed and now scheduled for Rt lung mass biopsy Risks and Benefits discussed with the patient including, but not limited to bleeding, hemoptysis, respiratory failure requiring intubation, infection, pneumothorax requiring chest tube placement, stroke from air embolism or even death. All of the patient's questions were answered, patient is agreeable to proceed. Consent signed and in chart.   Thank you for this interesting consult.  I greatly enjoyed meeting Alexander Landry and look forward to participating in their care.  A copy of this report was sent to the requesting provider on this date.  Electronically Signed: Amaury Kuzel A 05/10/2016, 10:32 AM   I spent a total of  30 Minutes   in face to face in clinical consultation, greater than 50% of which was counseling/coordinating care for right lung mass biopsy

## 2016-05-10 NOTE — Sedation Documentation (Signed)
Still up to 10/10 pain to chest wall anterior and posterior. Has Vicodin ordered for short stay.

## 2016-05-10 NOTE — Progress Notes (Signed)
Portable chest xray done. Pt tolerated well. Reports pain relief after 2 oral Vicodin.

## 2016-05-10 NOTE — Progress Notes (Signed)
Called Dr. Anselm Pancoast to review chest xray to see if safe for pt to be DC home. Okay for pt to go home per Dr. Anselm Pancoast,

## 2016-05-10 NOTE — Progress Notes (Signed)
Pain increased in ribcage area from 6 to 9/10. Pt reports having to take a pain pill (Vicodin) every 4 hours at home, last dose at 0800. Paged Jannifer Franklin, PA for order, awaiting call back.

## 2016-05-10 NOTE — Sedation Documentation (Signed)
Patient on CT table. Legs are restless and is breathing at a rate over 40,shallow.

## 2016-05-10 NOTE — Sedation Documentation (Signed)
Patient is resting comfortably. 

## 2016-05-10 NOTE — Sedation Documentation (Signed)
Has pain in posterior and anterior chest up to 10/10. Present pre procedure

## 2016-05-12 ENCOUNTER — Telehealth: Payer: Self-pay | Admitting: Emergency Medicine

## 2016-05-12 ENCOUNTER — Telehealth: Payer: Self-pay | Admitting: Family Medicine

## 2016-05-12 DIAGNOSIS — C3490 Malignant neoplasm of unspecified part of unspecified bronchus or lung: Secondary | ICD-10-CM

## 2016-05-12 NOTE — Telephone Encounter (Signed)
Spoke with the patient's wife regarding bx results > shows adenoCA.  I will refer him to [facility] asap. He may need XRT to the sternum as this is where he is most symptomatic; ? Whether he would need a bx of this area as well to confirm malignancy there.

## 2016-05-12 NOTE — Telephone Encounter (Signed)
FYI    Pt's wife called this morning stating that she had not received biopsy results from Dr Lamonte Sakai and he was out of the office until next week. Pt is very weak and wife is having to assist him a lot. She sem aggravated and overwhelmed. She wanted to know if Dr Redmond School and another provider in our office could give her the results and help to proceed with what needs to be done. I saw notes in the system that Dr Agustina Caroli office was working to get the results and I advised her that we will do all that we can to assist them. Dr Lamonte Sakai was able to get results to the pt today and discussed a plan.

## 2016-05-12 NOTE — Telephone Encounter (Signed)
Spoke with pt's wife. They are requesting the results from the CT biopsy that he had done on 05/10/16.  RB - please advise. Thanks.

## 2016-05-13 ENCOUNTER — Telehealth: Payer: Self-pay

## 2016-05-13 ENCOUNTER — Ambulatory Visit
Admission: RE | Admit: 2016-05-13 | Payer: Commercial Managed Care - HMO | Source: Ambulatory Visit | Admitting: Radiation Oncology

## 2016-05-13 ENCOUNTER — Telehealth: Payer: Self-pay | Admitting: Internal Medicine

## 2016-05-13 MED ORDER — HYDROCODONE-ACETAMINOPHEN 7.5-325 MG PO TABS: 1.0000 | ORAL_TABLET | ORAL | 0 refills | Status: DC | PRN

## 2016-05-13 NOTE — Telephone Encounter (Signed)
[phone] Abas-son calling back

## 2016-05-13 NOTE — Telephone Encounter (Signed)
Pt wife called to see if you would refill pt's Norco- she states he has enough to last until Saturday p.m.  Her son Gianno will pick up the script. Thank you, Wells Guiles

## 2016-05-13 NOTE — Telephone Encounter (Signed)
Pt 42 wife confirmed, verified demo and insurance

## 2016-05-13 NOTE — Telephone Encounter (Signed)
Spoke with pt's so, Alexander Landry. They are wanting a call from [place].

## 2016-05-13 NOTE — Telephone Encounter (Signed)
RB did you call this pt yesterday?  Please advise. thanks

## 2016-05-13 NOTE — Telephone Encounter (Signed)
Biopsy reports came back adenocarcinoma. I will renew his pain medicine and give him 100 pills. He will be scheduled to see oncology ASAP

## 2016-05-14 ENCOUNTER — Other Ambulatory Visit: Payer: Self-pay | Admitting: Family Medicine

## 2016-05-14 ENCOUNTER — Telehealth: Payer: Self-pay | Admitting: Emergency Medicine

## 2016-05-14 ENCOUNTER — Encounter: Payer: Self-pay | Admitting: Internal Medicine

## 2016-05-14 ENCOUNTER — Telehealth: Payer: Self-pay | Admitting: Family Medicine

## 2016-05-14 ENCOUNTER — Telehealth: Payer: Self-pay | Admitting: Internal Medicine

## 2016-05-14 NOTE — Telephone Encounter (Signed)
Called and spoke with pts wife and she stated that she feels the pt needs to be seen before 11/13 appt with Dr. [name].  She stated that the pt is in a lot of pain and she wanted to see if RB would be able to try and get him in for an earlier appt. RB please advise. thanks

## 2016-05-14 NOTE — Telephone Encounter (Signed)
Mailed pt letter, in basket referring provider appt date/time, contact pcp to obtain Craig Staggers

## 2016-05-14 NOTE — Telephone Encounter (Signed)
Is this okay to refill? 

## 2016-05-14 NOTE — Telephone Encounter (Signed)
Call the pharmacy and lett's override this. Also explained that I did not refill the tramadol as it is not as effective as the codeine

## 2016-05-14 NOTE — Telephone Encounter (Signed)
Spoke with Water quality scientist (pharmacist) at [facility]- gave verbal that it is ok to refill hydrocodone early as frequency in admin is increased. Nira Conn states this should not be a problem. Called pt's son and notified him of this and that I spoke with Nira Conn.

## 2016-05-14 NOTE — Telephone Encounter (Signed)
Pt's wife called stating that pharmacy told her that she can not pick up the Hydrocodone med that were sent to the pharmacy yesterday until Tuesday and wife is concerned that pt may run out of medication possibly Monday. Pharmacy told her it is too soon to pick up medication because pt is using it about every 4 hours and both previous script and current script does not have directions stating to take Hydrocodone every 4 hours.

## 2016-05-17 ENCOUNTER — Ambulatory Visit: Payer: Commercial Managed Care - HMO | Admitting: Family Medicine

## 2016-05-17 NOTE — Telephone Encounter (Signed)
I spoke with his son

## 2016-05-17 NOTE — Telephone Encounter (Signed)
Spoke with Judson Roch at [facility] sooner appts than 05-24-16 as Alexander Landry is booked and on call. Judson Roch did state that the patient could call each day to see if any sooner appts with Alexander Landry are available. Pt is aware, has Oncology phone number, and will let us know if he gets in sooner. Nothing more needed at this time.

## 2016-05-17 NOTE — Telephone Encounter (Signed)
Spoke with Alexander Landry, pt;s son. Pt is having significant sternum pain, suspect from met. I asked him to continue vicodin 7.5, stop tylenol and add ibuprofen 610m q6h prn until he can be seen by dr MJulien Nordmann They want to see oncology asap and I believe that the existing appt is likely as quick as we can get. Please call oncology and see if we can add pt to a cancellation call-back list - he would be will ing to com in on short notice if dr MJulien Nordmanngets a cancellation. Thanks.

## 2016-05-20 ENCOUNTER — Encounter: Payer: Self-pay | Admitting: Radiation Oncology

## 2016-05-20 NOTE — Progress Notes (Addendum)
Thoracic Location of Tumor / Histology: Right lower lobe lung   Patient presented  months ago with symptoms of: Chest pain left side x 2 months  Biopsies of  (if applicable) revealed: Diagnosis 05/10/2016 : Lung, needle/core biopsy(ies), Right lower lobe- ADENOCARCINOMA    Tobacco/Marijuana/Snuff/ETOH use:  Former cigarette smoker,  1ppd x 40 years, no smokelss tobacco, no alcohol or drug use  Past/Anticipated interventions by cardiothoracic surgery, if any:   Past/Anticipated interventions by medical oncology, if any: treated with chemo 20 years ago for hodgkin's' lymphoma, appt with Dr. Julien Nordmann 05/24/16 @ 215pm  Signs/Symptoms  Weight changes, if any:   Respiratory complaints, if any:   Hemoptysis, if any:   Pain issues, if any:    SAFETY ISSUES:  Prior radiation? NO  Pacemaker/ICD? NO  Is the patient on methotrexate?    Current Complaints / other details:  Married, Mother kidney cancer

## 2016-05-21 ENCOUNTER — Encounter: Payer: Self-pay | Admitting: *Deleted

## 2016-05-21 NOTE — Progress Notes (Signed)
Oncology Nurse Navigator Documentation  Oncology Nurse Navigator Flowsheets 05/21/2016  Navigator Encounter Type Other/per cancer conference request. Mr. Priore's tissue sent to [facility].  Due to insurance this will not go out until 05/26/16  Treatment Phase Pre-Tx/Tx Discussion  Barriers/Navigation Needs Coordination of Care  Interventions Coordination of Care  Coordination of Care Other  Acuity Level 1  Time Spent with Patient 30

## 2016-05-24 ENCOUNTER — Ambulatory Visit: Admission: RE | Admit: 2016-05-24 | Payer: Commercial Managed Care - HMO | Source: Ambulatory Visit

## 2016-05-24 ENCOUNTER — Encounter (HOSPITAL_COMMUNITY): Payer: Self-pay | Admitting: Emergency Medicine

## 2016-05-24 ENCOUNTER — Ambulatory Visit (HOSPITAL_BASED_OUTPATIENT_CLINIC_OR_DEPARTMENT_OTHER): Payer: Commercial Managed Care - HMO | Admitting: Internal Medicine

## 2016-05-24 ENCOUNTER — Encounter: Payer: Self-pay | Admitting: Internal Medicine

## 2016-05-24 ENCOUNTER — Ambulatory Visit
Admission: RE | Admit: 2016-05-24 | Discharge: 2016-05-24 | Disposition: A | Discharge status: Home / Self Care | Payer: Commercial Managed Care - HMO | Source: Ambulatory Visit | Attending: Radiation Oncology | Admitting: Radiation Oncology

## 2016-05-24 ENCOUNTER — Emergency Department (HOSPITAL_COMMUNITY): Payer: Commercial Managed Care - HMO

## 2016-05-24 ENCOUNTER — Telehealth: Payer: Self-pay | Admitting: Internal Medicine

## 2016-05-24 ENCOUNTER — Other Ambulatory Visit: Payer: Self-pay | Admitting: Medical Oncology

## 2016-05-24 ENCOUNTER — Other Ambulatory Visit (HOSPITAL_BASED_OUTPATIENT_CLINIC_OR_DEPARTMENT_OTHER): Payer: Commercial Managed Care - HMO

## 2016-05-24 ENCOUNTER — Inpatient Hospital Stay (HOSPITAL_COMMUNITY)
Admission: EM | Admit: 2016-05-24 | Discharge: 2016-05-31 | DRG: 682 | Disposition: A | Discharge status: Home Health | Payer: Commercial Managed Care - HMO | Attending: Internal Medicine | Admitting: Internal Medicine

## 2016-05-24 DIAGNOSIS — Z6823 Body mass index (BMI) 23.0-23.9, adult: Secondary | ICD-10-CM

## 2016-05-24 DIAGNOSIS — C7951 Secondary malignant neoplasm of bone: Secondary | ICD-10-CM | POA: Insufficient documentation

## 2016-05-24 DIAGNOSIS — C3491 Malignant neoplasm of unspecified part of right bronchus or lung: Secondary | ICD-10-CM

## 2016-05-24 DIAGNOSIS — C801 Malignant (primary) neoplasm, unspecified: Secondary | ICD-10-CM | POA: Insufficient documentation

## 2016-05-24 DIAGNOSIS — E875 Hyperkalemia: Secondary | ICD-10-CM | POA: Diagnosis present

## 2016-05-24 DIAGNOSIS — N289 Disorder of kidney and ureter, unspecified: Secondary | ICD-10-CM | POA: Diagnosis not present

## 2016-05-24 DIAGNOSIS — Z510 Encounter for antineoplastic radiation therapy: Secondary | ICD-10-CM | POA: Insufficient documentation

## 2016-05-24 DIAGNOSIS — K219 Gastro-esophageal reflux disease without esophagitis: Secondary | ICD-10-CM | POA: Diagnosis present

## 2016-05-24 DIAGNOSIS — I959 Hypotension, unspecified: Secondary | ICD-10-CM | POA: Diagnosis present

## 2016-05-24 DIAGNOSIS — B9620 Unspecified Escherichia coli [E. coli] as the cause of diseases classified elsewhere: Secondary | ICD-10-CM | POA: Clinically undetermined

## 2016-05-24 DIAGNOSIS — E861 Hypovolemia: Secondary | ICD-10-CM | POA: Diagnosis present

## 2016-05-24 DIAGNOSIS — N390 Urinary tract infection, site not specified: Secondary | ICD-10-CM | POA: Diagnosis present

## 2016-05-24 DIAGNOSIS — E860 Dehydration: Secondary | ICD-10-CM | POA: Diagnosis present

## 2016-05-24 DIAGNOSIS — E118 Type 2 diabetes mellitus with unspecified complications: Secondary | ICD-10-CM | POA: Diagnosis present

## 2016-05-24 DIAGNOSIS — E43 Unspecified severe protein-calorie malnutrition: Secondary | ICD-10-CM | POA: Diagnosis present

## 2016-05-24 DIAGNOSIS — R509 Fever, unspecified: Secondary | ICD-10-CM

## 2016-05-24 DIAGNOSIS — I10 Essential (primary) hypertension: Secondary | ICD-10-CM | POA: Diagnosis present

## 2016-05-24 DIAGNOSIS — E876 Hypokalemia: Secondary | ICD-10-CM | POA: Diagnosis not present

## 2016-05-24 DIAGNOSIS — R0602 Shortness of breath: Secondary | ICD-10-CM | POA: Diagnosis not present

## 2016-05-24 DIAGNOSIS — J189 Pneumonia, unspecified organism: Secondary | ICD-10-CM | POA: Diagnosis not present

## 2016-05-24 DIAGNOSIS — Z87891 Personal history of nicotine dependence: Secondary | ICD-10-CM | POA: Diagnosis not present

## 2016-05-24 DIAGNOSIS — M4000 Postural kyphosis, site unspecified: Secondary | ICD-10-CM | POA: Diagnosis present

## 2016-05-24 DIAGNOSIS — Y95 Nosocomial condition: Secondary | ICD-10-CM | POA: Diagnosis not present

## 2016-05-24 DIAGNOSIS — Z7984 Long term (current) use of oral hypoglycemic drugs: Secondary | ICD-10-CM

## 2016-05-24 DIAGNOSIS — Z8571 Personal history of Hodgkin lymphoma: Secondary | ICD-10-CM

## 2016-05-24 DIAGNOSIS — G92 Toxic encephalopathy: Secondary | ICD-10-CM | POA: Diagnosis not present

## 2016-05-24 DIAGNOSIS — Y92230 Patient room in hospital as the place of occurrence of the external cause: Secondary | ICD-10-CM | POA: Diagnosis not present

## 2016-05-24 DIAGNOSIS — Z79899 Other long term (current) drug therapy: Secondary | ICD-10-CM

## 2016-05-24 DIAGNOSIS — R0902 Hypoxemia: Secondary | ICD-10-CM | POA: Diagnosis not present

## 2016-05-24 DIAGNOSIS — C3431 Malignant neoplasm of lower lobe, right bronchus or lung: Secondary | ICD-10-CM | POA: Diagnosis present

## 2016-05-24 DIAGNOSIS — T407X5A Adverse effect of cannabis (derivatives), initial encounter: Secondary | ICD-10-CM | POA: Diagnosis not present

## 2016-05-24 DIAGNOSIS — C8190 Hodgkin lymphoma, unspecified, unspecified site: Secondary | ICD-10-CM

## 2016-05-24 DIAGNOSIS — Z8051 Family history of malignant neoplasm of kidney: Secondary | ICD-10-CM

## 2016-05-24 DIAGNOSIS — E1159 Type 2 diabetes mellitus with other circulatory complications: Secondary | ICD-10-CM | POA: Diagnosis present

## 2016-05-24 DIAGNOSIS — T404X5A Adverse effect of other synthetic narcotics, initial encounter: Secondary | ICD-10-CM | POA: Diagnosis not present

## 2016-05-24 DIAGNOSIS — N179 Acute kidney failure, unspecified: Secondary | ICD-10-CM | POA: Diagnosis present

## 2016-05-24 DIAGNOSIS — I152 Hypertension secondary to endocrine disorders: Secondary | ICD-10-CM | POA: Diagnosis present

## 2016-05-24 DIAGNOSIS — Z9221 Personal history of antineoplastic chemotherapy: Secondary | ICD-10-CM | POA: Diagnosis not present

## 2016-05-24 DIAGNOSIS — C3490 Malignant neoplasm of unspecified part of unspecified bronchus or lung: Secondary | ICD-10-CM

## 2016-05-24 DIAGNOSIS — R2231 Localized swelling, mass and lump, right upper limb: Secondary | ICD-10-CM | POA: Diagnosis not present

## 2016-05-24 HISTORY — DX: Malignant neoplasm of unspecified part of unspecified bronchus or lung: C34.90

## 2016-05-24 HISTORY — DX: Malignant neoplasm of unspecified part of right bronchus or lung: C34.91

## 2016-05-24 LAB — COMPREHENSIVE METABOLIC PANEL
ALT: 13 U/L (ref 0–55)
ANION GAP: 15 meq/L — AB (ref 3–11)
AST: 8 U/L (ref 5–34)
Albumin: 3.2 g/dL — ABNORMAL LOW (ref 3.5–5.0)
Alkaline Phosphatase: 118 U/L (ref 40–150)
BILIRUBIN TOTAL: 0.44 mg/dL (ref 0.20–1.20)
BUN: 75.9 mg/dL — ABNORMAL HIGH (ref 7.0–26.0)
CALCIUM: 10.5 mg/dL — AB (ref 8.4–10.4)
CHLORIDE: 108 meq/L (ref 98–109)
CO2: 12 mEq/L — ABNORMAL LOW (ref 22–29)
CREATININE: 3.2 mg/dL — AB (ref 0.7–1.3)
EGFR: 18 mL/min/{1.73_m2} — AB (ref >90)
Glucose: 196 mg/dl — ABNORMAL HIGH (ref 70–140)
Potassium: 7.9 mEq/L (ref 3.5–5.1)
Sodium: 135 mEq/L — ABNORMAL LOW (ref 136–145)
Total Protein: 8.2 g/dL (ref 6.4–8.3)

## 2016-05-24 LAB — CBC WITH DIFFERENTIAL/PLATELET
BASO%: 1 % (ref 0.0–2.0)
BASOS ABS: 0.1 10*3/uL (ref 0.0–0.1)
EOS ABS: 0.3 10*3/uL (ref 0.0–0.5)
EOS%: 2.5 % (ref 0.0–7.0)
HEMATOCRIT: 40.3 % (ref 38.4–49.9)
HGB: 13.6 g/dL (ref 13.0–17.1)
LYMPH#: 2.5 10*3/uL (ref 0.9–3.3)
LYMPH%: 23.7 % (ref 14.0–49.0)
MCH: 32.2 pg (ref 27.2–33.4)
MCHC: 33.7 g/dL (ref 32.0–36.0)
MCV: 95.5 fL (ref 79.3–98.0)
MONO#: 1.2 10*3/uL — AB (ref 0.1–0.9)
MONO%: 11.5 % (ref 0.0–14.0)
NEUT#: 6.5 10*3/uL (ref 1.5–6.5)
NEUT%: 61.3 % (ref 39.0–75.0)
PLATELETS: 320 10*3/uL (ref 140–400)
RBC: 4.22 10*6/uL (ref 4.20–5.82)
RDW: 14.9 % — ABNORMAL HIGH (ref 11.0–14.6)
WBC: 10.6 10*3/uL — ABNORMAL HIGH (ref 4.0–10.3)

## 2016-05-24 LAB — CBG MONITORING, ED
GLUCOSE-CAPILLARY: 126 mg/dL — AB (ref 65–99)
Glucose-Capillary: 103 mg/dL — ABNORMAL HIGH (ref 65–99)
Glucose-Capillary: 89 mg/dL (ref 65–99)
Glucose-Capillary: 168 mg/dL — ABNORMAL HIGH (ref 65–99)

## 2016-05-24 LAB — BASIC METABOLIC PANEL
Anion gap: 8 (ref 5–15)
Anion gap: 9 (ref 5–15)
BUN: 77 mg/dL — ABNORMAL HIGH (ref 6–20)
BUN: 72 mg/dL — AB (ref 6–20)
CALCIUM: 9.4 mg/dL (ref 8.9–10.3)
CO2: 15 mmol/L — AB (ref 22–32)
CO2: 15 mmol/L — ABNORMAL LOW (ref 22–32)
CREATININE: 2.86 mg/dL — AB (ref 0.61–1.24)
CREATININE: 2.55 mg/dL — AB (ref 0.61–1.24)
Calcium: 9.2 mg/dL (ref 8.9–10.3)
Chloride: 115 mmol/L — ABNORMAL HIGH (ref 101–111)
Chloride: 107 mmol/L (ref 101–111)
GFR calc Af Amer: 26 mL/min — ABNORMAL LOW (ref >60)
GFR calc non Af Amer: 19 mL/min — ABNORMAL LOW (ref >60)
GFR, EST AFRICAN AMERICAN: 22 mL/min — AB (ref >60)
GFR, EST NON AFRICAN AMERICAN: 22 mL/min — AB (ref >60)
Glucose, Bld: 63 mg/dL — ABNORMAL LOW (ref 65–99)
Glucose, Bld: 194 mg/dL — ABNORMAL HIGH (ref 65–99)
Potassium: 6.8 mmol/L (ref 3.5–5.1)
Potassium: 6.6 mmol/L (ref 3.5–5.1)
SODIUM: 131 mmol/L — AB (ref 135–145)
Sodium: 138 mmol/L (ref 135–145)

## 2016-05-24 MED ORDER — SODIUM BICARBONATE 8.4 % IV SOLN
INTRAVENOUS | Status: DC
  Administered 2016-05-24 – 2016-05-26 (×5): via INTRAVENOUS
  Filled 2016-05-24 (×10): qty 150

## 2016-05-24 MED ORDER — METHYLPREDNISOLONE 4 MG PO TBPK: ORAL_TABLET | ORAL | 0 refills | Status: DC

## 2016-05-24 MED ORDER — DEXTROSE 50 % IV SOLN
25.0000 mL | Freq: Once | INTRAVENOUS | Status: AC
  Administered 2016-05-24: 25 mL via INTRAVENOUS
  Filled 2016-05-24: qty 50

## 2016-05-24 MED ORDER — DEXTROSE 10 % IV SOLN
Freq: Once | INTRAVENOUS | Status: AC
  Administered 2016-05-24: 17:00:00 via INTRAVENOUS
  Filled 2016-05-24: qty 1000

## 2016-05-24 MED ORDER — ONDANSETRON HCL 4 MG/2ML IJ SOLN: 4.0000 mg | Freq: Four times a day (QID) | INTRAMUSCULAR | Status: DC | PRN

## 2016-05-24 MED ORDER — ALBUTEROL (5 MG/ML) CONTINUOUS INHALATION SOLN
10.0000 mg/h | INHALATION_SOLUTION | RESPIRATORY_TRACT | Status: AC
  Administered 2016-05-24: 10 mg/h via RESPIRATORY_TRACT
  Filled 2016-05-24: qty 20

## 2016-05-24 MED ORDER — ONDANSETRON HCL 4 MG PO TABS: 4.0000 mg | ORAL_TABLET | Freq: Four times a day (QID) | ORAL | Status: DC | PRN

## 2016-05-24 MED ORDER — TRAMADOL HCL 50 MG PO TABS
50.0000 mg | ORAL_TABLET | Freq: Every day | ORAL | Status: DC | PRN
  Administered 2016-05-25 (×2): 50 mg via ORAL
  Filled 2016-05-24 (×2): qty 1

## 2016-05-24 MED ORDER — INSULIN ASPART 100 UNIT/ML ~~LOC~~ SOLN
0.0000 [IU] | Freq: Three times a day (TID) | SUBCUTANEOUS | Status: DC
  Administered 2016-05-25: 5 [IU] via SUBCUTANEOUS

## 2016-05-24 MED ORDER — PANTOPRAZOLE SODIUM 40 MG PO TBEC
40.0000 mg | DELAYED_RELEASE_TABLET | Freq: Every day | ORAL | Status: DC
  Administered 2016-05-26 – 2016-05-30 (×5): 40 mg via ORAL
  Filled 2016-05-24 (×7): qty 1

## 2016-05-24 MED ORDER — INSULIN ASPART 100 UNIT/ML IV SOLN
10.0000 [IU] | Freq: Once | INTRAVENOUS | Status: AC
  Administered 2016-05-24: 10 [IU] via INTRAVENOUS
  Filled 2016-05-24: qty 0.1

## 2016-05-24 MED ORDER — POLYETHYLENE GLYCOL 3350 17 G PO PACK: 17.0000 g | PACK | Freq: Every day | ORAL | Status: DC | PRN

## 2016-05-24 MED ORDER — DRONABINOL 2.5 MG PO CAPS
2.5000 mg | ORAL_CAPSULE | Freq: Two times a day (BID) | ORAL | Status: DC
  Administered 2016-05-25: 2.5 mg via ORAL
  Filled 2016-05-24: qty 1

## 2016-05-24 MED ORDER — DEXTROSE 50 % IV SOLN
50.0000 mL | Freq: Once | INTRAVENOUS | Status: AC
  Administered 2016-05-24: 50 mL via INTRAVENOUS
  Filled 2016-05-24: qty 50

## 2016-05-24 MED ORDER — HYDROCODONE-ACETAMINOPHEN 7.5-325 MG PO TABS
1.0000 | ORAL_TABLET | ORAL | Status: DC | PRN
  Administered 2016-05-24 – 2016-05-31 (×21): 1 via ORAL
  Filled 2016-05-24 (×22): qty 1

## 2016-05-24 MED ORDER — SODIUM CHLORIDE 0.9 % IV BOLUS (SEPSIS)
1000.0000 mL | Freq: Once | INTRAVENOUS | Status: AC
  Administered 2016-05-24: 1000 mL via INTRAVENOUS

## 2016-05-24 MED ORDER — CALCIUM GLUCONATE 10 % IV SOLN
1.0000 g | Freq: Once | INTRAVENOUS | Status: AC
  Administered 2016-05-24: 1 g via INTRAVENOUS
  Filled 2016-05-24: qty 10

## 2016-05-24 MED ORDER — ALBUTEROL SULFATE (2.5 MG/3ML) 0.083% IN NEBU: 10.0000 mg | INHALATION_SOLUTION | Freq: Once | RESPIRATORY_TRACT | Status: DC

## 2016-05-24 MED ORDER — SIMVASTATIN 20 MG PO TABS
20.0000 mg | ORAL_TABLET | Freq: Every day | ORAL | Status: DC
  Administered 2016-05-25 – 2016-05-30 (×6): 20 mg via ORAL
  Filled 2016-05-24 (×6): qty 1

## 2016-05-24 MED ORDER — SODIUM CHLORIDE 0.9% FLUSH
3.0000 mL | Freq: Two times a day (BID) | INTRAVENOUS | Status: DC
  Administered 2016-05-25 – 2016-05-30 (×9): 3 mL via INTRAVENOUS

## 2016-05-24 MED ORDER — ADULT MULTIVITAMIN W/MINERALS CH
1.0000 | ORAL_TABLET | Freq: Every day | ORAL | Status: DC
  Administered 2016-05-26 – 2016-05-30 (×5): 1 via ORAL
  Filled 2016-05-24 (×7): qty 1

## 2016-05-24 MED ORDER — SODIUM POLYSTYRENE SULFONATE 15 GM/60ML PO SUSP
15.0000 g | ORAL | Status: DC
  Administered 2016-05-24 – 2016-05-25 (×3): 15 g via ORAL
  Filled 2016-05-24 (×4): qty 60

## 2016-05-24 MED ORDER — SODIUM POLYSTYRENE SULFONATE 15 GM/60ML PO SUSP: 45.0000 g | Freq: Once | ORAL | Status: DC

## 2016-05-24 MED ORDER — CALCIUM GLUCONATE 10 % IV SOLN
1.0000 g | Freq: Once | INTRAVENOUS | Status: AC
Start: 2016-05-24 — End: 2016-05-24
  Administered 2016-05-24: 1 g via INTRAVENOUS
  Filled 2016-05-24: qty 10

## 2016-05-24 MED ORDER — DRONABINOL 2.5 MG PO CAPS: 2.5000 mg | ORAL_CAPSULE | Freq: Two times a day (BID) | ORAL | 0 refills | Status: DC

## 2016-05-24 NOTE — ED Notes (Signed)
Family at bedside. 

## 2016-05-24 NOTE — H&P (Signed)
History and Physical    Alexander Landry IWP:[id number] DOB: March 29, 1933 DOA: 05/24/2016  PCP: [name], MD  Patient coming from: Oncology clinic  Chief Complaint: Hyperkalemia  HPI: Alexander Landry is a 82 y.o. male with medical history significant of hypertension, diabetes, acid reflux, kyphosis, history of Hodgkin's lymphoma is status post systemic chemotherapy about 20 years ago, recently diagnosed with metastatic bronchogenic adenocarcinoma, sent from oncology clinic for serum potassium level of 8. Patient was recently complaining of chest pain when CT scan of chest was done which was consistent with poorly nodule. Patient underwent PET scan and CT guided core biopsy of the right lower lung by IR on October 30. The final pathology result came back adenocarcinoma. Patient came to oncology clinic first time today for initiation of treatment. At oncology clinic, the lab work up was done. Later on serum potassium level was found to be 8 and renal failure. Patient was brought to emergency room.  Patient reported feeling weak tired. He has not been eating or drinking well for about a month. He has been taking Advil for the pain management. His energy level is low. Also has on intestinal weight loss about 20 pounds in last few months. He denied headache, nausea, vomiting, abdominal pain. Denied shortness of breath or cough. He has chronic chest discomfort. Reports decreasing urine output. Denies dysuria, urgency. Also has loose bowel movement. Patient's wife and son accompanied him in the ER. ED Course: In the ER patient was evaluated by nephrologist. He was treated with IV calcium, albuterol, accelerated, insulin, dextrose for the management of hyperkalemia. Blood pressure improved after IV fluid. He is admitted for further evaluation.  Review of Systems: As per HPI otherwise 10 point review of systems negative.    Past Medical History:  Diagnosis Date  . Adenocarcinoma of right  lung, stage 4 ([place]) 05/24/2016  . Diabetes mellitus without complication ([place])   . GERD (gastroesophageal reflux disease)   . Hypertension   . Kyphosis (acquired) (postural)   . Lung cancer ([place]) 05/10/2016   right lower lobe lung=adenocarcinoma  . Medical history non-contributory     History reviewed. No pertinent surgical history.  Social history: reports that he quit smoking about 30 years ago. His smoking use included Cigarettes. He has a 40.00 pack-year smoking history. He has never used smokeless tobacco. He reports that he does not drink alcohol or use drugs.  No Known Allergies  Family History  Problem Relation Age of Onset  . Kidney cancer Mother      Prior to Admission medications   Medication Sig Start Date End Date Taking? Authorizing Provider  amLODipine (NORVASC) 10 MG tablet TAKE 1 TABLET EVERY DAY 02/16/16  Yes Denita Lung, MD  atenolol (TENORMIN) 50 MG tablet Take 1 tablet (50 mg total) by mouth 2 (two) times daily. 04/12/16  Yes Denita Lung, MD  dronabinol (MARINOL) 2.5 MG capsule Take 1 capsule (2.5 mg total) by mouth 2 (two) times daily before a meal. 05/24/16  Yes Curt Bears, MD  HYDROcodone-acetaminophen (NORCO) 7.5-325 MG tablet Take 1 tablet by mouth every 4 (four) hours as needed for moderate pain. 05/13/16  Yes Denita Lung, MD  ibuprofen (ADVIL,MOTRIN) 200 MG tablet Take 600 mg by mouth every 6 (six) hours as needed.   Yes Historical Provider, MD  lisinopril (PRINIVIL,ZESTRIL) 40 MG tablet Take 1 tablet (40 mg total) by mouth daily. 03/01/16  Yes Denita Lung, MD  metFORMIN (GLUCOPHAGE) 500 MG tablet Take 1  tablet (500 mg total) by mouth 2 (two) times daily. 04/05/16  Yes Denita Lung, MD  Multiple Vitamins-Minerals (MULTIVITAMIN WITH MINERALS) tablet Take 1 tablet by mouth daily.   Yes Historical Provider, MD  omeprazole (PRILOSEC) 20 MG capsule TAKE 1 CAPSULE EVERY DAY 09/16/15  Yes Denita Lung, MD  pioglitazone (ACTOS) 30 MG tablet 1 tablet  daily 09/16/15  Yes Denita Lung, MD  simvastatin (ZOCOR) 20 MG tablet Take 1 tablet (20 mg total) by mouth daily at 6 PM. 08/25/15  Yes Denita Lung, MD  traMADol (ULTRAM) 50 MG tablet Take 50 mg by mouth daily as needed for moderate pain.  04/26/16  Yes Historical Provider, MD  methylPREDNISolone (MEDROL DOSEPAK) 4 MG TBPK tablet Use as instructed Patient not taking: Reported on 05/24/2016 05/24/16   Curt Bears, MD    Physical Exam: Vitals:   05/24/16 1551 05/24/16 1609 05/24/16 1716  BP: 102/65  (!) 102/43  Pulse: (!) 56  95  Resp: 16  18  Temp: 97.6 F (36.4 C)    TempSrc: Oral    SpO2: 100% 100% 96%      Constitutional: NAD, calm, comfortable, Thin elderly male. Vitals:   05/24/16 1551 05/24/16 1609 05/24/16 1716  BP: 102/65  (!) 102/43  Pulse: (!) 56  95  Resp: 16  18  Temp: 97.6 F (36.4 C)    TempSrc: Oral    SpO2: 100% 100% 96%   Eyes: PERRL, lids and conjunctivae normal ENMT: Dry mucous membrane, Neck: normal, supple, no masses, no thyromegaly Respiratory: clear to auscultation bilaterally, no wheezing, no crackles. Normal respiratory effort. No accessory muscle use.  Cardiovascular: Regular rate and rhythm, no murmurs / rubs / gallops. No extremity edema. 2+ pedal pulses. No carotid bruits.  Abdomen: no tenderness, no masses palpated. No hepatosplenomegaly. Bowel sounds positive.  Musculoskeletal: No muscle deformity or contracture. Skin: no rashes, lesions, ulcers. No induration Neurologic: CN 2-12 grossly intact. Muscle strength 5 over 5 in all extremities ,symmetric. Psychiatric: Normal judgment and insight. Alert and oriented x 3. Normal mood.    Labs on Admission: I have personally reviewed following labs and imaging studies  CBC:  Recent Labs Lab 05/24/16 1324  WBC 10.6*  NEUTROABS 6.5  HGB 13.6  HCT 40.3  MCV 95.5  PLT 694   Basic Metabolic Panel:  Recent Labs Lab 05/24/16 1324  NA 135*  K 7.9 3 specimens. repeated X2 each.*    CO2 12*  GLUCOSE 196*  BUN 75.9*  CREATININE 3.2*  CALCIUM 10.5*   GFR: Estimated Creatinine Clearance: 21.6 mL/min (by C-G formula based on SCr of 3.2 mg/dL [facility])). Liver Function Tests:  Recent Labs Lab 05/24/16 1324  AST 8  ALT 13  ALKPHOS 118  BILITOT 0.44  PROT 8.2  ALBUMIN 3.2*   No results for input(s): LIPASE, AMYLASE in the last 168 hours. No results for input(s): AMMONIA in the last 168 hours. Coagulation Profile: No results for input(s): INR, PROTIME in the last 168 hours. Cardiac Enzymes: No results for input(s): CKTOTAL, CKMB, CKMBINDEX, TROPONINI in the last 168 hours. BNP (last 3 results) No results for input(s): PROBNP in the last 8760 hours. HbA1C: No results for input(s): HGBA1C in the last 72 hours. CBG:  Recent Labs Lab 05/24/16 1555 05/24/16 1711 05/24/16 1734  GLUCAP 126* 103* 89   Lipid Profile: No results for input(s): CHOL, HDL, LDLCALC, TRIG, CHOLHDL, LDLDIRECT in the last 72 hours. Thyroid Function Tests: No results for input(s): TSH,  T4TOTAL, FREET4, T3FREE, THYROIDAB in the last 72 hours. Anemia Panel: No results for input(s): VITAMINB12, FOLATE, FERRITIN, TIBC, IRON, RETICCTPCT in the last 72 hours. Urine analysis:    Component Value Date/Time   BILIRUBINUR neg 07/03/2014 1320   PROTEINUR neg 07/03/2014 1320   UROBILINOGEN negative 07/03/2014 1320   NITRITE neg 07/03/2014 1320   LEUKOCYTESUR Negative 07/03/2014 1320   Sepsis Labs: !!!!!!!!!!!!!!!!!!!!!!!!!!!!!!!!!!!!!!!!!!!! '@LABRCNTIP'$ (procalcitonin:4,lacticidven:4) )No results found for this or any previous visit (from the past 240 hour(s)).   Radiological Exams on Admission: No results found.  EKG: Sinus rhythm. EKG with possible peaked T wave in lead 2, v4-6. Assessment/Plan  #Severe hyperkalemia: In the setting of renal failure, lisinopril. Patient is hemodynamically stable. EKG with peaked T wave. Patient was already received medical management including albuterol,  bicarbonate, insulin, dextrose, IV fluid and Kayexalate. Repeat BMP. -Currently on sodium bicarbonate fluid. -Patient was already evaluated by nephrologist. Patient will be transferred to [facility] as per nephrologist request in case if patient required renal replacement therapy. I discussed with the ER physician. -Continue telemetry monitoring and frequent BMP.  #Acute kidney injury in the setting of severe dehydration, lisinopril, Advil: As per ER nurse, the bladder has no urinary retention. Plan for ultrasound kidneys. Continue IV fluid. UA, urine sodium potassium ordered. Monitor BMP. Strict ins and outs.  #Metastatic lung cancer: Patient recently had an biopsy and now undergoing treatment evaluation by his oncologist and radiation oncologist. Follow-up with them outpatient.  #  Diabetes mellitus type 2 with complications [facility]): Monitor blood sugar level. Holding oral hypoglycemic agent. Cover hyperglycemia with sliding scale.  #Hypotension likely hypovolemic: Blood pressure improved after IV fluid in the ER. Holding blood pressure medications.  #Likely moderate protein calorie malnutrition: Dietary referral.  DVT prophylaxis with SCD.  DVT prophylaxis: SCD. Holding anticoagulation in case patient needs hemodialysis access. Code Status: Full code Family Communication: Discussed with the patient's wife and son at bedside in the ER. Disposition Plan: Transferring to [facility]. Discussed with my colleague over there, Dr. Lorin Mercy Consults called: Nephrologist already evaluated the patient. Seen by oncologist today. Admission status: Inpatient because of severe date of illness including severe hyperkalemia, kidney failure.   Noralee Dutko Tanna Furry MD [facility] Pager [phone]  If 7PM-7AM, please contact night-coverage [email or web address] Password [facility]  05/24/2016, 6:17 PM

## 2016-05-24 NOTE — ED Notes (Signed)
Pt's son reports pt went to the cancer ctr to see his doctor d/t on-going cp since last week and increasing weakness.  Pt was recently dx with lung ca.  Pt is A&Ox 4.  HOH.  Was sent to the ED d/t hyperkalemia = 8.  Pt's son also reports pt has been c/o mild SOB with the cp.

## 2016-05-24 NOTE — Telephone Encounter (Signed)
Gave patient avs report and appointments for December  °

## 2016-05-24 NOTE — Progress Notes (Signed)
Pt transported via wheelchair to ed lobby -report given to charge RN - pt accompanied by family and in no acute distress.

## 2016-05-24 NOTE — ED Notes (Signed)
[place] RT at bedside to start 1 hr neb tx

## 2016-05-24 NOTE — ED Provider Notes (Signed)
[facility] DEPT Provider Note   CSN: [id number] Arrival date & time: 05/24/16  1524     History   Chief Complaint Chief Complaint  Patient presents with  . hyperkalemia    HPI Alexander Landry is a 82 y.o. male.  HPI  82 year old male sent from the oncology clinic for hyperkalemia. Told he has a potassium of 8. Patient has a recent diagnosis of lung adenocarcinoma with bone metastases. Was seeing oncology for the first time today and had labs drawn. Was waiting to see radiation for a consult when he was told to come straight to the ER. Patient has been feeling weak for several weeks. Poor appetite that is worsening. No cough, shortness of breath, vomiting, or diarrhea. No known renal disease.  Past Medical History:  Diagnosis Date  . Adenocarcinoma of right lung, stage 4 ([place]) 05/24/2016  . Diabetes mellitus without complication ([place])   . GERD (gastroesophageal reflux disease)   . Hypertension   . Kyphosis (acquired) (postural)   . Lung cancer ([place]) 05/10/2016   right lower lobe lung=adenocarcinoma  . Medical history non-contributory     Patient Active Problem List   Diagnosis Date Noted  . Adenocarcinoma of right lung, stage 4 ([place]) 05/24/2016  . Hodgkin's lymphoma ([place])   . Lung nodules 04/23/2016  . Sternal pain 03/25/2016  . Chest pain 03/25/2016  . Spine deformity 03/25/2016  . Need for prophylactic vaccination and inoculation against influenza 03/25/2016  . Onychomycosis 10/23/2015  . Presbycusis of both ears 11/01/2014  . Diabetes mellitus type 2 with complications ([place]) 52/77/8242  . Hypertension associated with diabetes ([place]) 03/29/2013  . Hyperlipidemia with target LDL less than 70 03/29/2013  . History of Hodgkin's lymphoma 03/29/2013  . History of renal stone 03/29/2013    History reviewed. No pertinent surgical history.     Home Medications    Prior to Admission medications   Medication Sig Start Date End Date Taking? Authorizing Provider   amLODipine (NORVASC) 10 MG tablet TAKE 1 TABLET EVERY DAY 02/16/16   Denita Lung, MD  atenolol (TENORMIN) 100 MG tablet  04/13/16   Historical Provider, MD  atenolol (TENORMIN) 50 MG tablet Take 1 tablet (50 mg total) by mouth 2 (two) times daily. 04/12/16   Denita Lung, MD  dronabinol (MARINOL) 2.5 MG capsule Take 1 capsule (2.5 mg total) by mouth 2 (two) times daily before a meal. 05/24/16   Curt Bears, MD  HYDROcodone-acetaminophen (NORCO) 7.5-325 MG tablet Take 1 tablet by mouth every 4 (four) hours as needed for moderate pain. 05/13/16   Denita Lung, MD  HYDROcodone-acetaminophen (NORCO/VICODIN) 5-325 MG tablet  05/03/16   Historical Provider, MD  ibuprofen (ADVIL,MOTRIN) 200 MG tablet Take 600 mg by mouth every 6 (six) hours as needed.    Historical Provider, MD  lisinopril (PRINIVIL,ZESTRIL) 40 MG tablet Take 1 tablet (40 mg total) by mouth daily. 03/01/16   Denita Lung, MD  metFORMIN (GLUCOPHAGE) 500 MG tablet Take 1 tablet (500 mg total) by mouth 2 (two) times daily. 04/05/16   Denita Lung, MD  methylPREDNISolone (MEDROL DOSEPAK) 4 MG TBPK tablet Use as instructed 05/24/16   Curt Bears, MD  Multiple Vitamins-Minerals (MULTIVITAMIN WITH MINERALS) tablet Take 1 tablet by mouth daily.    Historical Provider, MD  omeprazole (PRILOSEC) 20 MG capsule TAKE 1 CAPSULE EVERY DAY 09/16/15   Denita Lung, MD  pioglitazone (ACTOS) 30 MG tablet 1 tablet daily 09/16/15   Denita Lung, MD  simvastatin (ZOCOR) 20 MG tablet Take 1 tablet (20 mg total) by mouth daily at 6 PM. 08/25/15   Denita Lung, MD  traMADol Veatrice Bourbon) 50 MG tablet  04/26/16   Historical Provider, MD    Family History Family History  Problem Relation Age of Onset  . Kidney cancer Mother     Social History Social History  Substance Use Topics  . Smoking status: Former Smoker    Packs/day: 1.00    Years: 40.00    Types: Cigarettes    Quit date: 08/24/1985  . Smokeless tobacco: Never Used  . Alcohol use No       Allergies   Patient has no known allergies.   Review of Systems Review of Systems  Constitutional: Positive for appetite change and fatigue.  Respiratory: Negative for shortness of breath.   Cardiovascular: Negative for chest pain.  Gastrointestinal: Negative for abdominal pain, diarrhea and vomiting.  Neurological: Positive for weakness.  All other systems reviewed and are negative.    Physical Exam Updated Vital Signs BP (!) 102/43 (BP Location: Right Arm)   Pulse 95   Temp 97.6 F (36.4 C) (Oral)   Resp 18   SpO2 96%   Physical Exam  Constitutional: He is oriented to person, place, and time. He appears well-developed and well-nourished.  HENT:  Head: Normocephalic and atraumatic.  Right Ear: External ear normal.  Left Ear: External ear normal.  Nose: Nose normal.  Mouth/Throat: Mucous membranes are dry.  Eyes: Right eye exhibits no discharge. Left eye exhibits no discharge.  Neck: Neck supple.  Cardiovascular: Normal rate, regular rhythm and normal heart sounds.   Pulmonary/Chest: Effort normal and breath sounds normal. He has no wheezes. He has no rales.  Abdominal: Soft. There is no tenderness.  Musculoskeletal: He exhibits no edema.  Neurological: He is alert and oriented to person, place, and time.  Skin: Skin is warm and dry.  Nursing note and vitals reviewed.    ED Treatments / Results  Labs (all labs ordered are listed, but only abnormal results are displayed) Labs Reviewed  CBG MONITORING, ED - Abnormal; Notable for the following:       Result Value   Glucose-Capillary 126 (*)    All other components within normal limits  CBG MONITORING, ED - Abnormal; Notable for the following:    Glucose-Capillary 103 (*)    All other components within normal limits  GLUCOSE, RANDOM  BASIC METABOLIC PANEL  BASIC METABOLIC PANEL  BASIC METABOLIC PANEL  URINALYSIS, ROUTINE W REFLEX MICROSCOPIC (NOT AT [facility])  SODIUM, URINE, RANDOM  BASIC METABOLIC PANEL     EKG  EKG Interpretation  Date/Time:  Monday May 24 2016 15:46:14 EST Ventricular Rate:  54 PR Interval:    QRS Duration: 130 QT Interval:  415 QTC Calculation: 394 R Axis:   -68 Text Interpretation:  Atrial fibrillation Nonspecific IVCD with LAD No old tracing to compare Confirmed by Jaycie Kregel MD, Lashawnda Hancox [phone]) on 05/24/2016 3:50:16 PM       EKG Interpretation  Date/Time:  Monday May 24 2016 16:58:26 EST Ventricular Rate:  85 PR Interval:    QRS Duration: 119 QT Interval:  352 QTC Calculation: 419 R Axis:   -66 Text Interpretation:  Sinus rhythm Prolonged PR interval Left anterior fascicular block Rate is faster, otherwise no signficant change since earlier in the day Confirmed by Malayia Spizzirri MD, Nagi Furio ([id number]) on 05/24/2016 5:24:17 PM       Radiology No results found.  Procedures Procedures (including  critical care time)  CRITICAL CARE Performed by: Sherwood Gambler T   Total critical care time: 45 minutes  Critical care time was exclusive of separately billable procedures and treating other patients.  Critical care was necessary to treat or prevent imminent or life-threatening deterioration.  Critical care was time spent personally by me on the following activities: development of treatment plan with patient and/or surrogate as well as nursing, discussions with consultants, evaluation of patient's response to treatment, examination of patient, obtaining history from patient or surrogate, ordering and performing treatments and interventions, ordering and review of laboratory studies, ordering and review of radiographic studies, pulse oximetry and re-evaluation of patient's condition.   Medications Ordered in ED Medications  albuterol (PROVENTIL,VENTOLIN) solution continuous neb (10 mg/hr Nebulization New Bag/Given 05/24/16 1607)  sodium polystyrene (KAYEXALATE) 15 GM/60ML suspension 45 g ( Oral Canceled Entry 05/24/16 1653)  sodium bicarbonate 150 mEq in  dextrose 5 % 1,000 mL infusion (not administered)  sodium polystyrene (KAYEXALATE) 15 GM/60ML suspension 15 g (not administered)  calcium gluconate 1 g in sodium chloride 0.9 % 100 mL IVPB (not administered)  calcium gluconate inj 10% (1 g) URGENT USE ONLY! (1 g Intravenous Given 05/24/16 1623)  insulin aspart (novoLOG) injection 10 Units (10 Units Intravenous Given 05/24/16 1623)  dextrose 10 % infusion ( Intravenous New Bag/Given 05/24/16 1632)  sodium chloride 0.9 % bolus 1,000 mL (1,000 mLs Intravenous New Bag/Given 05/24/16 1619)  sodium chloride 0.9 % bolus 1,000 mL (1,000 mLs Intravenous New Bag/Given 05/24/16 1711)  dextrose 50 % solution 25 mL (25 mLs Intravenous Given 05/24/16 1637)     Initial Impression / Assessment and Plan / ED Course  I have reviewed the triage vital signs and the nursing notes.  Pertinent labs & imaging results that were available during my care of the patient were reviewed by me and considered in my medical decision making (see chart for details).  Clinical Course as of May 24 2342  [facility] May 24, 2016  1601 Patient has clear lungs. Will give IV fluids, hyperkalemia medicines such as calcium, insulin, glucose, albuterol. Consult nephrology.  [SG]  1608 Dr. Jonnie Finner recommends 2L IVF (already getting started) as well the calcium, insulin, glucose, albuterol. Asks for 45 g kayexalate and 125 ml/hr bicarb drip after 2L IVF. Will repeat EKG about 15 min after initial treatments  [SG]  1728 Dr. Carolin Sicks to admit to [facility], stepdown. Will call back with accepting physician's name  [SG]  1815 Dr. Lorin Mercy will be accepting physician and Zacarias Pontes  [SG]    Clinical Course User Index [SG] Sherwood Gambler, MD    Patient was treated for hyperkalemia. His mental status has a well. No hypotension. Post void residual does not show significant urinary retention that would cause his degree of renal failure. Probably from poor oral intake. He was resuscitated with fluids and  then started on the bicarbonate drip. Dr. Jonnie Finner saw in the ED. Admitted to [facility] cone stepdown unit.   Final Clinical Impressions(s) / ED Diagnoses   Final diagnoses:  Hyperkalemia  Acute renal failure, unspecified acute renal failure type [facility])    New Prescriptions New Prescriptions   No medications on file     Sherwood Gambler, MD 05/24/16 2345

## 2016-05-24 NOTE — ED Notes (Addendum)
Pt being sent by [facility] Ctr d/t Potassium 8 (checked 3 times).  Hx of lung CA w/ bone mets.  No current chemo or radiation.   Pt is followed by Julien Nordmann MD.

## 2016-05-24 NOTE — ED Notes (Signed)
Pt became restless and was attempting to get out of bed per family at bedside.  Pt is laying on his R side, states his chest is hurting him.

## 2016-05-24 NOTE — Consult Note (Signed)
Renal Service Consult Note [facility]  Alexander Landry [facility] 05/24/2016 Alexander Landry D Requesting Physician:  Dr Regenia Skeeter  Reason for Consult:  AKI, Raliegh Ip HPI: The patient is a 82 y.o. year-old with history of DM, HTN, GERD who developed chest pain and was found to have a lung nodule on CT scan done April 22, 2016.   Had PET scan 10/27 which showed LAN R hilar , and bony mets in the sternum, shoulders and thoracic spine amongst other bony areas. Had biopsy of the lung mass on 10/30 by IR, found to be adenocarcinoma of the lung.  Seen by oncology first visit today. Pt was very weak and having diarrhea. Was taking Advil / Vicodin per PCP, poor appetite w wt loss recently.  No n/v.  Pt was sent to ED for labs.  In the ED BP's soft [id number] systolic, creat up at 3.2, K 7.9, EKG showing sinus bradycardia and prolonged QRS of 130 msec.  Asked to see for AKI with hyperkalemia.    Home meds : norvasc, atenolol, Marinol, Advil, lisinopril, Glucophage, medrol dosepak, MVI, PPI, Actos, zocor.   Patient married w 2 children.  Worked as a Administrator.  Smoked about 50 yrs and quit 20 yrs ago after a diagnosis of Hodgkin's lymphoma.  No hx etoh/ drug use.   ROS  denies CP  no joint pain   no HA  no blurry vision  no rash  +some diarrhea last few days  +some n/v last 48 hrs  no dysuria  no difficulty voiding  no change in urine color    Past Medical History  Past Medical History:  Diagnosis Date  . Adenocarcinoma of right lung, stage 4 ([place]) 05/24/2016  . Diabetes mellitus without complication ([place])   . GERD (gastroesophageal reflux disease)   . Hypertension   . Kyphosis (acquired) (postural)   . Lung cancer ([place]) 05/10/2016   right lower lobe lung=adenocarcinoma  . Medical history non-contributory    Past Surgical History History reviewed. No pertinent surgical history. Family History  Family History  Problem Relation Age of Onset  . Kidney cancer Mother    Social  History  reports that he quit smoking about 30 years ago. His smoking use included Cigarettes. He has a 40.00 pack-year smoking history. He has never used smokeless tobacco. He reports that he does not drink alcohol or use drugs. Allergies No Known Allergies Home medications Prior to Admission medications   Medication Sig Start Date End Date Taking? Authorizing Provider  amLODipine (NORVASC) 10 MG tablet TAKE 1 TABLET EVERY DAY 02/16/16   Denita Lung, MD  atenolol (TENORMIN) 50 MG tablet Take 1 tablet (50 mg total) by mouth 2 (two) times daily. 04/12/16   Denita Lung, MD  dronabinol (MARINOL) 2.5 MG capsule Take 1 capsule (2.5 mg total) by mouth 2 (two) times daily before a meal. 05/24/16   Curt Bears, MD  HYDROcodone-acetaminophen (NORCO) 7.5-325 MG tablet Take 1 tablet by mouth every 4 (four) hours as needed for moderate pain. 05/13/16   Denita Lung, MD  ibuprofen (ADVIL,MOTRIN) 200 MG tablet Take 600 mg by mouth every 6 (six) hours as needed.    Historical Provider, MD  lisinopril (PRINIVIL,ZESTRIL) 40 MG tablet Take 1 tablet (40 mg total) by mouth daily. 03/01/16   Denita Lung, MD  metFORMIN (GLUCOPHAGE) 500 MG tablet Take 1 tablet (500 mg total) by mouth 2 (two) times daily. 04/05/16   Denita Lung, MD  methylPREDNISolone (MEDROL  DOSEPAK) 4 MG TBPK tablet Use as instructed 05/24/16   Curt Bears, MD  Multiple Vitamins-Minerals (MULTIVITAMIN WITH MINERALS) tablet Take 1 tablet by mouth daily.    Historical Provider, MD  omeprazole (PRILOSEC) 20 MG capsule TAKE 1 CAPSULE EVERY DAY 09/16/15   Denita Lung, MD  pioglitazone (ACTOS) 30 MG tablet 1 tablet daily 09/16/15   Denita Lung, MD  simvastatin (ZOCOR) 20 MG tablet Take 1 tablet (20 mg total) by mouth daily at 6 PM. 08/25/15   Denita Lung, MD   Liver Function Tests  Recent Labs Lab 05/24/16 1324  AST 8  ALT 13  ALKPHOS 118  BILITOT 0.44  PROT 8.2  ALBUMIN 3.2*   No results for input(s): LIPASE, AMYLASE in the  last 168 hours. CBC  Recent Labs Lab 05/24/16 1324  WBC 10.6*  NEUTROABS 6.5  HGB 13.6  HCT 40.3  MCV 95.5  PLT 450   Basic Metabolic Panel  Recent Labs Lab 05/24/16 1324  NA 135*  K 7.9 3 specimens. repeated X2 each.*  CO2 12*  GLUCOSE 196*  BUN 75.9*  CREATININE 3.2*  CALCIUM 10.5*   Iron/TIBC/Ferritin/ %Sat No results found for: IRON, TIBC, FERRITIN, IRONPCTSAT  Vitals:   05/24/16 1551 05/24/16 1609  BP: 102/65   Pulse: (!) 56   Resp: 16   Temp: 97.6 F (36.4 C)   TempSrc: Oral   SpO2: 100% 100%   Exam Gen thin pleasant WM, no distress, eyes closed, awakens easily No rash, cyanosis or gangrene Sclera anicteric, throat clear  No jvd or bruits Chest clear bilat RRR no MRG Abd soft ntnd no mass or ascites +bs no hsm GU normal male MS no joint effusions or deformity Ext no LE or UE edema / no wounds or ulcers Neuro is alert, Ox 3 , nf   Na 135  K 7.9  CO2 12  BUN 76  Cr 3.2 Alb 3.2   LFT's ok   WBC 10k  HB 13  plt 320  Assessment: 1. AKI - due to volume depletion w nsaid's/ ACEi's, resulting in functional ARF.  UA pending.  Plan IVF"s, kayexalate, hold ACEi/ nsaid's.    ED giving IV insulin/ glucose now w IV Ca++ and inhaled nebs for acute hyperK+ treatment.  Would have patient admitted to SDU at [facility] in case HD is needed. Hopefully we can manage this medically w/o having to do HD.  Follow labs every 4 hrs for now.   2. Severe hyperkalemia - as above 3. Metastatic lung cancer - recent diagnosis, has not started Rx yet; bony mets mostly  4. HTN - bp's soft, no BP meds needed 5. DM2 - on metformin/ Actos at home, hold metformin due to AKI 6. Vol depletion    Plan - as above  Kelly Splinter MD [employer] pager [phone]   05/24/2016, 4:13 PM

## 2016-05-24 NOTE — ED Notes (Signed)
Hospitalist at bedside 

## 2016-05-24 NOTE — Progress Notes (Signed)
[place] [phone]   [phone]  CONSULT NOTE  REFERRING PHYSICIAN: Dr. Baltazar Apo  REASON FOR CONSULTATION:  82 years old white male recently diagnosed with lung cancer.  HPI Alexander Landry is a 82 y.o. male with past medical history significant for Hodgkin's lymphoma status post systemic chemotherapy 20 years ago under the care of Dr. Sonny Dandy. He also has a history of diabetes mellitus, hypertension, GERD and kyphosis. Over the last 2-3 months the patient has been complaining of pain in the center of his chest as well as shoulder area. He was seen by his primary care physician Dr. Redmond School and x-ray of the bilateral ribs showed no definite acute rib fractures. CT scan of the chest with contrast on 04/22/2016 showed 2.3 cm irregular pulmonary nodule in the posterior right lower lobe highly suspicious for primary bronchogenic carcinoma. There was also 0.8 cm spiculated nodule in the right middle lobe suspicious for synchronous bronchogenic carcinoma. There was mild right hilar and subcarinal lymphadenopathy suspicious for metastatic disease. The scan also showed several foci of groundglass opacity in the right upper lobe, largest measuring 1.1 x 2.4 cm and low-grade adenocarcinoma could not be excluded. The patient was referred to Dr. Lamonte Sakai and a PET scan on 05/07/2016 showed hypermetabolic 2.4 cm posterior right lower lobe pulmonary nodule most consistent with primary bronchogenic carcinoma. There was also hypermetabolic right hilar lymphadenopathy consistent with nodal metastasis but no hypermetabolic mediastinal or contralateral hilar adenopathy. There was multifocal hyper metabolic osseous lesions throughout the axial skeleton and bilateral shoulders guidance most prominent in the sternum and thoracic spine with associated findings sclerosis on the CT images most consistent with multifocal osseous metastatic disease. The subcentimeter right middle lobe  pulmonary nodule was below the PET resolution and the ill-defined groundglass right upper lobe 2.3 cm pulmonary nodule had low-level metabolism and could not exclude indolent adenocarcinoma. On 05/10/2016 the patient underwent CT-guided core biopsy of the right lower lobe lung nodule by interventional radiology. The final pathology ([id number]) showed adenocarcinoma. Dr. Lamonte Sakai kindly referred the patient to me today for further evaluation and recommendation regarding treatment of his condition. When seen today he continues to complain of generalized fatigue and weakness. He is also very anxious and went to the bathroom several time because of diarrhea today. He continues to have pain in the center of the chest and he is currently on Vicodin and Advil by his primary care physician. He has lack of appetite and lost more than 20 pounds over the last few months. He has shortness breath with exertion but no significant cough or hemoptysis. He denied having any headache or visual changes. Family history significant for mother with renal cell carcinoma, father had hepatitis C. The patient is married and has 2 children. He was accompanied today by his wife Ishmael Holter and his son Deray as well as his daughter-in-law. He used to work as a Administrator. He has a history of smoking for around 50 years but quit 20 years ago after he was diagnosed with Hodgkin lymphoma. No history of alcohol or drug abuse.  HPI  Past Medical History:  Diagnosis Date  . Adenocarcinoma of right lung, stage 4 ([place]) 05/24/2016  . Diabetes mellitus without complication ([place])   . GERD (gastroesophageal reflux disease)   . Hypertension   . Kyphosis (acquired) (postural)   . Lung cancer ([place]) 05/10/2016   right lower lobe lung=adenocarcinoma  . Medical history non-contributory     History reviewed.  No pertinent surgical history.  Family History  Problem Relation Age of Onset  . Kidney cancer Mother     Social History Social  History  Substance Use Topics  . Smoking status: Former Smoker    Packs/day: 1.00    Years: 40.00    Types: Cigarettes    Quit date: 08/24/1985  . Smokeless tobacco: Never Used  . Alcohol use No    No Known Allergies  Current Outpatient Prescriptions  Medication Sig Dispense Refill  . amLODipine (NORVASC) 10 MG tablet TAKE 1 TABLET EVERY DAY 90 tablet 3  . atenolol (TENORMIN) 50 MG tablet Take 1 tablet (50 mg total) by mouth 2 (two) times daily. 60 tablet 11  . HYDROcodone-acetaminophen (NORCO) 7.5-325 MG tablet Take 1 tablet by mouth every 4 (four) hours as needed for moderate pain. 100 tablet 0  . ibuprofen (ADVIL,MOTRIN) 200 MG tablet Take 600 mg by mouth every 6 (six) hours as needed.    Marland Kitchen lisinopril (PRINIVIL,ZESTRIL) 40 MG tablet Take 1 tablet (40 mg total) by mouth daily. 90 tablet 3  . metFORMIN (GLUCOPHAGE) 500 MG tablet Take 1 tablet (500 mg total) by mouth 2 (two) times daily. 180 tablet 0  . Multiple Vitamins-Minerals (MULTIVITAMIN WITH MINERALS) tablet Take 1 tablet by mouth daily.    Marland Kitchen omeprazole (PRILOSEC) 20 MG capsule TAKE 1 CAPSULE EVERY DAY 90 capsule 3  . pioglitazone (ACTOS) 30 MG tablet 1 tablet daily 90 tablet 3  . simvastatin (ZOCOR) 20 MG tablet Take 1 tablet (20 mg total) by mouth daily at 6 PM. 90 tablet 3  . dronabinol (MARINOL) 2.5 MG capsule Take 1 capsule (2.5 mg total) by mouth 2 (two) times daily before a meal. 60 capsule 0  . methylPREDNISolone (MEDROL DOSEPAK) 4 MG TBPK tablet Use as instructed 21 tablet 0   No current facility-administered medications for this visit.     Review of Systems  Constitutional: positive for anorexia, fatigue and weight loss Eyes: negative Ears, nose, mouth, throat, and face: negative Respiratory: positive for dyspnea on exertion, pleurisy/chest pain and wheezing Cardiovascular: negative Gastrointestinal: positive for diarrhea Genitourinary:negative Integument/breast: negative Hematologic/lymphatic:  negative Musculoskeletal:positive for bone pain Neurological: negative Behavioral/Psych: negative Endocrine: negative Allergic/Immunologic: negative  Physical Exam  [id number], healthy, no distress, well nourished, well developed and anxious SKIN: skin color, texture, turgor are normal, no rashes or significant lesions HEAD: Normocephalic, No masses, lesions, tenderness or abnormalities EYES: normal, PERRLA, Conjunctiva are pink and non-injected EARS: External ears normal, Canals clear OROPHARYNX:no exudate, no erythema and lips, buccal mucosa, and tongue normal  NECK: supple, no adenopathy, no JVD LYMPH:  no palpable lymphadenopathy, no hepatosplenomegaly LUNGS: prolonged expiratory phase, expiratory wheezes bilaterally HEART: regular rate & rhythm, no murmurs and no gallops ABDOMEN:abdomen soft, non-tender, normal bowel sounds and no masses or organomegaly BACK: Back symmetric, no curvature., No CVA tenderness EXTREMITIES:no joint deformities, effusion, or inflammation, no edema, no skin discoloration  NEURO: alert & oriented x 3 with fluent speech, no focal motor/sensory deficits  PERFORMANCE STATUS: ECOG 1-2  LABORATORY DATA: Lab Results  Component Value Date   WBC 10.6 (H) 05/24/2016   HGB 13.6 05/24/2016   HCT 40.3 05/24/2016   MCV 95.5 05/24/2016   PLT 320 05/24/2016      Chemistry      Component Value Date/Time   NA 138 05/10/2016 0932   K 3.7 05/10/2016 0932   CL 111 05/10/2016 0932   CO2 16 (L) 05/10/2016 0932   BUN 15 05/10/2016 0932  CREATININE 0.98 05/10/2016 0932   CREATININE 0.91 02/11/2016 1135      Component Value Date/Time   CALCIUM 9.6 05/10/2016 0932   ALKPHOS 75 02/11/2016 1135   AST 7 (L) 02/11/2016 1135   ALT 9 02/11/2016 1135   BILITOT 0.7 02/11/2016 1135       RADIOGRAPHIC STUDIES: Nm Pet Image Initial (pi) Skull Base To Thigh  Result Date: 05/03/2016 CLINICAL DATA:  Initial treatment strategy for right pulmonary nodules on recent  chest CT performed in the setting of chest pain. Remote history of Hodgkin's lymphoma. Former smoker. EXAM: NUCLEAR MEDICINE PET SKULL BASE TO THIGH TECHNIQUE: 11.7 mCi F-18 FDG was injected intravenously. Full-ring PET imaging was performed from the skull base to thigh after the radiotracer. CT data was obtained and used for attenuation correction and anatomic localization. FASTING BLOOD GLUCOSE:  Value: 177 mg/dl COMPARISON:  04/22/2016 chest CT. FINDINGS: NECK No hypermetabolic lymph nodes in the neck. Relatively symmetric hypermetabolism in the glottis without CT correlate, favor physiologic uptake. Mildly enlarged heterogeneous thyroid gland with no hypermetabolic thyroid nodules. CHEST Left main, left anterior descending, left circumflex and right coronary atherosclerosis. Atherosclerotic nonaneurysmal thoracic aorta. No pleural effusions. Hypermetabolic posterior right lower lobe 2.4 x 1.5 cm pulmonary nodule (series 8/image 39) with max SUV 5.6, stable in size since 04/22/2016. Right middle lobe 7 mm solid pulmonary nodule (series 8/image 47), below PET resolution, not associated with significant metabolism. Ill-defined 2.6 x 1.2 cm right upper lobe ground-glass nodule (series 8/image 20) with associated low level metabolism (max SUV 2.3). No acute consolidative airspace disease or additional significant pulmonary nodules. Relatively symmetric mild reticulonodular opacities at the lung apices are consistent with pleural-parenchymal scarring. Mild centrilobular emphysema. Hypermetabolic right hilar adenopathy with max SUV 7.9, poorly delineated on the noncontrast CT images. No hypermetabolic axillary, mediastinal or left hilar nodes. ABDOMEN/PELVIS No abnormal hypermetabolic activity within the liver, pancreas, adrenal glands, or spleen. No hypermetabolic lymph nodes in the abdomen or pelvis. Cholecystectomy. Simple 2.7 cm renal cyst in the lateral lower left kidney. Atherosclerotic nonaneurysmal abdominal  aorta. SKELETON There is faintly sclerotic hypermetabolic sternal osseous lesion with max SUV 10.7. There are several hypermetabolic osseous lesions in the thoracolumbar spine with associated faint sclerosis on the CT images, for example max SUV 9.1 in the T11 vertebral body and max SUV 9.4 in the right L3 posterior elements. There are hypermetabolic osseous lesions in the bilateral shoulder girdles and right ribs, for example max SUV 7.1 in the right second rib and max SUV 7.1 in the left superior scapula. There are hypermetabolic skeletal foci in the left anterior acetabulum (max SUV 7.9) and medial right iliac bone. Multiple pins are seen in the right femoral neck. IMPRESSION: 1. Hypermetabolic 2.4 cm posterior right lower lobe pulmonary nodule, most consistent with primary bronchogenic carcinoma. 2. Hypermetabolic right hilar lymphadenopathy, consistent with nodal metastasis. No hypermetabolic mediastinal or contralateral hilar adenopathy. 3. Multifocal hypermetabolic osseous lesions throughout the axial skeleton and bilateral shoulder girdles, most prominent in the sternum and thoracic spine with associated faint sclerosis on the CT images, most consistent with multifocal osseous metastatic disease. 4. Subcentimeter right middle lobe pulmonary nodule, below PET resolution, indeterminate. Ill-defined ground-glass right upper lobe 2.3 cm pulmonary nodule with low level metabolism, cannot exclude indolent adenocarcinoma. Recommend follow-up of these nodules on chest CT in 3-6 months. 5. Additional findings include aortic atherosclerosis, coronary atherosclerosis and mild emphysema. Electronically Signed   By: Ilona Sorrel M.D.   On: 05/03/2016 16:47  Ct Biopsy  Result Date: 05/10/2016 INDICATION: 82 year old with a suspicious nodule in the right lower lobe and multiple bone lesions. Findings are concerning for lung cancer. Tissue diagnosis is needed. EXAM: CT-GUIDED CORE BIOPSY OF RIGHT LOWER LOBE LUNG  NODULE MEDICATIONS: None. ANESTHESIA/SEDATION: Moderate (conscious) sedation was employed during this procedure. A total of Versed 1.0 mg and Fentanyl 25 mcg was administered intravenously. Moderate Sedation Time: 15 minutes. The patient's level of consciousness and vital signs were monitored continuously by radiology nursing throughout the procedure under my direct supervision. FLUOROSCOPY TIME:  None COMPLICATIONS: None immediate. PROCEDURE: Informed written consent was obtained from the patient after a thorough discussion of the procedural risks, benefits and alternatives. All questions were addressed. A timeout was performed prior to the initiation of the procedure. Patient was placed on his right side. CT images through the chest were obtained. The nodule along the posterior right lower lobe was identified. Overlying skin was cleansed with chlorhexidine and sterile field was created. Skin was anesthetized with 1% lidocaine. A 17 gauge coaxial needle was directed into the pulmonary nodule with CT guidance. Needle position confirmed within the lesion. Two core biopsies were obtained with an 18 gauge core device. Specimens placed in formalin. 82 gauge needle was removed with the BiosSentry tract sealant. 17 gauge needle removed without complication. Bandage placed over the puncture site. FINDINGS: 2.4 cm pleural-based nodule in the right lower lobe. Needle position confirmed within this lesion. No significant pneumothorax or hemorrhage following the core biopsies. IMPRESSION: Successful CT-guided core biopsies of the right lower lobe nodule. Electronically Signed   By: Markus Daft M.D.   On: 05/10/2016 16:23   Dg Chest Port 1 View  Result Date: 05/10/2016 CLINICAL DATA:  Status post RIGHT lung biopsy. EXAM: PORTABLE CHEST 1 VIEW COMPARISON:  Chest radiograph March 25, 2016 FINDINGS: Mildly tortuous aorta associated with hypertension. Cardiac silhouette is normal in size. Similar prominent interstitial  markings and increased lung volumes most compatible with COPD. LEFT lung base atelectasis/ scarring. No pleural effusion. No pneumothorax. Soft tissue planes and included osseous structures are nonsuspicious. IMPRESSION: No pneumothorax. COPD.  LEFT lung base atelectasis/scarring. Electronically Signed   By: Elon Alas M.D.   On: 05/10/2016 14:33    ASSESSMENT: This is a very pleasant 82 years old white male recently diagnosed with a stage IV (T1b, N1, M1b) non-small cell lung cancer, adenocarcinoma presented with right lower lobe lung nodule in addition to right hilar lymphadenopathy and several metastatic bone lesions including the sternum, shoulder and thoracic vertebrae diagnosed in October 2017.   PLAN: I had a lengthy discussion with the patient and his family today about his current disease is stage, prognosis and treatment options. I explained to the patient and his family that he has incurable condition and or the treatment will be off palliative nature. I recommended for the patient to complete the staging workup by ordering a MRI of the brain to rule out brain metastasis. I will also ask the pathologist to send the tissue block to [facility] for molecular study and PDL 1 testing. The patient was also referred to Dr. Lisbeth Renshaw for evaluation and consideration of palliative radiotherapy to the painful metastatic bone lesions and he is expected to see him later today. For pain management, he will continue on Vicodin and Advil for now. He is also expected to start palliative radiotherapy soon under the care of Dr. Lisbeth Renshaw. I discussed with the patient his treatment options including palliative care and hospice referral versus  consideration of systemic treatment with either systemic chemotherapy, targeted therapy or immunotherapy based on the final molecular studies. He is interested in treatment and we will discuss this in more details once the [place] studies become available in around 2  weeks. In the meantime he will be receiving palliative radiotherapy. For the hyperkalemia, we repeated his potassium level and it still high at 8.0. We will send the patient immediately to the emergency department for further evaluation and consideration of admission and management of his hyperkalemia. He'll probably need aggressive hydration as well as Kayexalate during his admission. I will arrange for the patient to come back for follow-up visit in 3 weeks for more detailed discussion of his treatment options. The patient voices understanding of current disease status and treatment options and is in agreement with the current care plan.  All questions were answered. The patient knows to call the clinic with any problems, questions or concerns. We can certainly see the patient much sooner if necessary.  Thank you so much for allowing me to participate in the care of [employer]. I will continue to follow up the patient with you and assist in his care.  I spent 55 minutes counseling the patient face to face. The total time spent in the appointment was 80 minutes.  Disclaimer: This note was dictated with voice recognition software. Similar sounding words can inadvertently be transcribed and may not be corrected upon review.   Hanan Mcwilliams K. May 24, 2016, 2:49 PM

## 2016-05-24 NOTE — ED Notes (Signed)
Pt is getting mildly confused and disoriented., repeating everything he hears, states that it's 1834 when asked what the year is, A&O x 1 to self.  Regenia Skeeter EDP made aware.

## 2016-05-24 NOTE — ED Notes (Signed)
Attempted to call report at 4E, primary nurse is busy and will return call

## 2016-05-24 NOTE — ED Notes (Signed)
Unable to collect lab insulin has not been giving yet

## 2016-05-24 NOTE — ED Triage Notes (Signed)
Pt recently diagnosed with lung cancer and cancer in his sternum. Was at cancer center today for preliminary testing which found K 8.0. Has not started chemo yet.

## 2016-05-24 NOTE — ED Notes (Signed)
[place] Nephrologist at bedside

## 2016-05-25 ENCOUNTER — Ambulatory Visit: Payer: Commercial Managed Care - HMO | Admitting: Radiation Oncology

## 2016-05-25 ENCOUNTER — Encounter: Payer: Self-pay | Admitting: *Deleted

## 2016-05-25 DIAGNOSIS — I10 Essential (primary) hypertension: Secondary | ICD-10-CM

## 2016-05-25 DIAGNOSIS — Z8571 Personal history of Hodgkin lymphoma: Secondary | ICD-10-CM

## 2016-05-25 DIAGNOSIS — E43 Unspecified severe protein-calorie malnutrition: Secondary | ICD-10-CM | POA: Diagnosis present

## 2016-05-25 DIAGNOSIS — E1159 Type 2 diabetes mellitus with other circulatory complications: Secondary | ICD-10-CM

## 2016-05-25 LAB — CBC
HCT: 36.8 % — ABNORMAL LOW (ref 39.0–52.0)
HEMOGLOBIN: 12.4 g/dL — AB (ref 13.0–17.0)
MCH: 31.2 pg (ref 26.0–34.0)
MCHC: 33.7 g/dL (ref 30.0–36.0)
MCV: 92.5 fL (ref 78.0–100.0)
PLATELETS: 306 10*3/uL (ref 150–400)
RBC: 3.98 MIL/uL — AB (ref 4.22–5.81)
RDW: 14.6 % (ref 11.5–15.5)
WBC: 12.8 10*3/uL — ABNORMAL HIGH (ref 4.0–10.5)

## 2016-05-25 LAB — BASIC METABOLIC PANEL
Anion gap: 10 (ref 5–15)
BUN: 52 mg/dL — AB (ref 6–20)
CHLORIDE: 107 mmol/L (ref 101–111)
CO2: 19 mmol/L — ABNORMAL LOW (ref 22–32)
CREATININE: 1.73 mg/dL — AB (ref 0.61–1.24)
Calcium: 9.7 mg/dL (ref 8.9–10.3)
GFR, EST AFRICAN AMERICAN: 41 mL/min — AB (ref >60)
GFR, EST NON AFRICAN AMERICAN: 35 mL/min — AB (ref >60)
Glucose, Bld: 264 mg/dL — ABNORMAL HIGH (ref 65–99)
Potassium: 5.2 mmol/L — ABNORMAL HIGH (ref 3.5–5.1)
SODIUM: 136 mmol/L (ref 135–145)

## 2016-05-25 LAB — GLUCOSE, CAPILLARY
GLUCOSE-CAPILLARY: 274 mg/dL — AB (ref 65–99)
GLUCOSE-CAPILLARY: 165 mg/dL — AB (ref 65–99)
Glucose-Capillary: 146 mg/dL — ABNORMAL HIGH (ref 65–99)
Glucose-Capillary: 135 mg/dL — ABNORMAL HIGH (ref 65–99)

## 2016-05-25 LAB — URINALYSIS, ROUTINE W REFLEX MICROSCOPIC
BILIRUBIN URINE: NEGATIVE
GLUCOSE, UA: NEGATIVE mg/dL
HGB URINE DIPSTICK: NEGATIVE
Ketones, ur: NEGATIVE mg/dL
Leukocytes, UA: NEGATIVE
Nitrite: NEGATIVE
PH: 5.5 (ref 5.0–8.0)
Protein, ur: NEGATIVE mg/dL
SPECIFIC GRAVITY, URINE: 1.015 (ref 1.005–1.030)

## 2016-05-25 LAB — SODIUM, URINE, RANDOM: Sodium, Ur: 82 mmol/L

## 2016-05-25 LAB — MRSA PCR SCREENING: MRSA BY PCR: NEGATIVE

## 2016-05-25 MED ORDER — INSULIN ASPART 100 UNIT/ML ~~LOC~~ SOLN
0.0000 [IU] | Freq: Three times a day (TID) | SUBCUTANEOUS | Status: DC
  Administered 2016-05-25 (×2): 2 [IU] via SUBCUTANEOUS
  Administered 2016-05-26 (×2): 3 [IU] via SUBCUTANEOUS
  Administered 2016-05-27: 5 [IU] via SUBCUTANEOUS
  Administered 2016-05-27 – 2016-05-29 (×5): 3 [IU] via SUBCUTANEOUS
  Administered 2016-05-29 – 2016-05-31 (×3): 2 [IU] via SUBCUTANEOUS

## 2016-05-25 MED ORDER — HALOPERIDOL LACTATE 5 MG/ML IJ SOLN: 0.5000 mg | Freq: Four times a day (QID) | INTRAMUSCULAR | Status: DC | PRN

## 2016-05-25 MED ORDER — INSULIN ASPART 100 UNIT/ML ~~LOC~~ SOLN
3.0000 [IU] | Freq: Three times a day (TID) | SUBCUTANEOUS | Status: DC
  Administered 2016-05-25 – 2016-05-30 (×11): 3 [IU] via SUBCUTANEOUS

## 2016-05-25 MED ORDER — ACETAMINOPHEN 325 MG PO TABS
650.0000 mg | ORAL_TABLET | ORAL | Status: DC | PRN
  Administered 2016-05-29: 650 mg via ORAL
  Filled 2016-05-25 (×2): qty 2

## 2016-05-25 MED ORDER — INSULIN ASPART 100 UNIT/ML ~~LOC~~ SOLN
0.0000 [IU] | Freq: Every day | SUBCUTANEOUS | Status: DC
Start: 2016-05-25 — End: 2016-05-31

## 2016-05-25 MED ORDER — LORAZEPAM 2 MG/ML IJ SOLN
0.5000 mg | Freq: Once | INTRAMUSCULAR | Status: AC
  Administered 2016-05-25: 0.5 mg via INTRAVENOUS
  Filled 2016-05-25: qty 1

## 2016-05-25 MED ORDER — ENSURE ENLIVE PO LIQD
237.0000 mL | Freq: Two times a day (BID) | ORAL | Status: DC
  Administered 2016-05-25 – 2016-05-30 (×9): 237 mL via ORAL

## 2016-05-25 NOTE — Evaluation (Signed)
Physical Therapy Evaluation Patient Details Name: Alexander Landry MRN: [id number] DOB: 27-May-1933 Today's Date: 05/25/2016   History of Present Illness  82 y.o. Male with PMH significant of hypertension, diabetes, acid reflux, kyphosis, history of Hodgkin's lymphoma is status post systemic chemotherapy about 20 years ago, recently diagnosed with metastatic bronchogenic adenocarcinoma, sent from oncology clinic for serum potassium level of 8  Clinical Impression  Patient demonstrates deficits in functional mobility as indicated below. Will need continued skilled PT to address deficits and maximize function. Will see as indicated and progress as tolerated.   OF NOTE: Session limited due to transition from sinus to Afib with HR upper 140s in setting of Hyperkalemia. Will assess mobility further next session.    Follow Up Recommendations Home health PT;Supervision/Assistance - 24 hour    Equipment Recommendations  None recommended by PT    Recommendations for Other Services       Precautions / Restrictions Precautions Precautions: Fall Precaution Comments: watch HR      Mobility  Bed Mobility Overal bed mobility: Needs Assistance Bed Mobility: Supine to Sit;Sit to Supine     Supine to sit: Supervision Sit to supine: Supervision      Transfers Overall transfer level: Needs assistance Equipment used: 1 person hand held assist Transfers: Sit to/from Stand Sit to Stand: Min assist         General transfer comment: min assist for stability in coming to upright  Ambulation/Gait             General Gait Details: deferred due to transition to afib with HR upper 140s (high K noted in labs)  Stairs            Wheelchair Mobility    Modified Rankin (Stroke Patients Only)       Balance Overall balance assessment: Needs assistance Sitting-balance support: Feet supported Sitting balance-Leahy Scale: Good     Standing balance support: Single extremity  supported Standing balance-Leahy Scale: Poor                               Pertinent Vitals/Pain Pain Assessment: No/denies pain    Home Living Family/patient expects to be discharged to:: Private residence Living Arrangements: Spouse/significant other Available Help at Discharge: Family Type of Home: House Home Access: Stairs to enter Entrance Stairs-Rails: Right Entrance Stairs-Number of Steps: 4 Home Layout: One level Home Equipment: Environmental consultant - 2 wheels;Shower seat;Cane - single point      Prior Function Level of Independence: Independent with assistive device(s)         Comments: was using Rw for mobility per wife     Hand Dominance   Dominant Hand: Right    Extremity/Trunk Assessment   Upper Extremity Assessment: Overall WFL for tasks assessed           Lower Extremity Assessment: Overall WFL for tasks assessed         Communication   Communication: HOH  Cognition Arousal/Alertness: Awake/alert Behavior During Therapy: Flat affect Overall Cognitive Status: Impaired/Different from baseline Area of Impairment: Orientation;Memory;Safety/judgement;Awareness Orientation Level: Disoriented to;Place;Time;Situation   Memory: Decreased short-term memory   Safety/Judgement: Decreased awareness of safety;Decreased awareness of deficits Awareness: Emergent   General Comments: patient with intermittent confusion throughout session, though [place] was [place] the beach and then though he was in [place] Comments      Exercises     Assessment/Plan    PT Assessment Patient needs  continued PT services  PT Problem List Decreased strength;Decreased activity tolerance;Decreased balance;Decreased mobility;Decreased cognition;Decreased safety awareness;Cardiopulmonary status limiting activity          PT Treatment Interventions DME instruction;Gait training;Stair training;Functional mobility training;Therapeutic activities;Therapeutic exercise;Balance  training;Patient/family education    PT Goals (Current goals can be found in the Care Plan section)  Acute Rehab PT Goals Patient Stated Goal: to go home PT Goal Formulation: With patient/family Time For Goal Achievement: 06/08/16 Potential to Achieve Goals: Good    Frequency Min 3X/week   Barriers to discharge        Co-evaluation               End of Session   Activity Tolerance: Treatment limited secondary to medical complications (Comment) (transition to AFib with HR upper 140s in setting of hyper K) Patient left: in bed;with call bell/phone within reach;with bed alarm set;with family/visitor present;Other (comment) (mitts applied) Nurse Communication: Mobility status;Other (comment) (transition to Afib and elevated HR upper 140s)         Time: 6244-6950 PT Time Calculation (min) (ACUTE ONLY): 17 min   Charges:   PT Evaluation $PT Eval Moderate Complexity: 1 Procedure     PT G Codes:        Duncan Dull Jun 13, 2016, 5:48 PM Alben Deeds, [place] DPT  [phone]

## 2016-05-25 NOTE — Progress Notes (Signed)
Oncology Nurse Navigator Documentation  Oncology Nurse Navigator Flowsheets 05/25/2016  Navigator Location [facility]  Navigator Encounter Type Other/I noticed that Mr. Gittins is in the hospital.  I called Dr. Ida Rogue nurse.  I left her a vm message that patient was in the hospital and to follow up on his radiation appt's.    Treatment Phase Pre-Tx/Tx Discussion  Barriers/Navigation Needs Coordination of Care  Interventions Coordination of Care  Coordination of Care Appts  Acuity Level 2  Acuity Level 2 Other  Time Spent with Patient 30

## 2016-05-25 NOTE — Progress Notes (Addendum)
Initial Nutrition Assessment  DOCUMENTATION CODES:   Severe malnutrition in context of chronic illness  INTERVENTION:    Ensure Enlive po BID, each supplement provides 350 kcal and 20 grams of protein  NUTRITION DIAGNOSIS:   Malnutrition related to catabolic illness as evidenced by percent weight loss, energy intake < or equal to 50% for > or equal to 1 month  GOAL:   Patient will meet greater than or equal to 90% of their needs  MONITOR:   PO intake, Supplement acceptance, Labs, Weight trends, I & O's  REASON FOR ASSESSMENT:   Consult Assessment of nutrition requirement/status  ASSESSMENT:   82 y.o. Male with PMH significant of hypertension, diabetes, acid reflux, kyphosis, history of Hodgkin's lymphoma is status post systemic chemotherapy about 20 years ago, recently diagnosed with metastatic bronchogenic adenocarcinoma, sent from oncology clinic for serum potassium level of 8  Spoke with pt's wife and family at bedside >> pt in acute encephalopathy with mittens on. Report pt has had a decreased appetite for the last 2 weeks. Family also reveals pt has had a 20 lb wt loss in < 1 month. Likes Strawberry Ensure Enlive supplements; will order. CBG's [phone].  RD unable to complete Nutrition Focused Physical Exam at this time.  Diet Order:  Diet renal/carb modified with fluid restriction Diet-HS Snack? Nothing; Room service appropriate? Yes; Fluid consistency: Thin  Skin:  Reviewed, no issues  Last BM:  11/14  Height:   Ht Readings from Last 1 Encounters:  05/24/16 '6\' 3"'$  (1.905 m)    Weight:   Wt Readings from Last 1 Encounters:  05/24/16 191 lb 12.8 oz (87 kg)    Ideal Body Weight:  89 kg  BMI:  Body mass index is 23.97 kg/m.  Estimated Nutritional Needs:   Kcal:  1650-1850  Protein:  75-85 gm  Fluid:  1.6-1.8 L  EDUCATION NEEDS:   No education needs identified at this time  Arthur Holms, RD, LDN Pager #: [phone] After-Hours Pager #:  [phone]

## 2016-05-25 NOTE — Progress Notes (Signed)
   [facility] Progress Note   Subjective: confused overnight, o/w better w creat down 1.7 and K down 5.2 this am.  Got 2-3 more 15gm kayexalate doses overnight.   Vitals:   05/24/16 2230 05/24/16 2338 05/25/16 0300 05/25/16 0740  BP: 117/68 118/68 (!) 142/50 (!) 147/86  Pulse:    (!) 107  Resp: 21 (!) 28 (!) 38 (!) 31  Temp:  99.8 F (37.7 C) 98.7 F (37.1 C) 99.5 F (37.5 C)  TempSrc:  Oral Oral Oral  SpO2:  96% 92% 93%  Weight:  87 kg (191 lb 12.8 oz)    Height:  '6\' 3"'$  (1.905 m)      Inpatient medications: . insulin aspart  0-15 Units Subcutaneous TID WC  . insulin aspart  0-5 Units Subcutaneous QHS  . insulin aspart  3 Units Subcutaneous TID WC  . multivitamin with minerals  1 tablet Oral Daily  . pantoprazole  40 mg Oral Daily  . simvastatin  20 mg Oral q1800  . sodium chloride flush  3 mL Intravenous Q12H   .  sodium bicarbonate  infusion 1000 mL 125 mL/hr at 05/25/16 0300   acetaminophen, haloperidol lactate, HYDROcodone-acetaminophen, ondansetron **OR** ondansetron (ZOFRAN) IV, polyethylene glycol  Exam: Gen confused, hands in mitts No jvd or bruits Chest clear bilat RRR no MRG Abd soft ntnd no mass or ascites +bs no hsm GU normal male MS no joint effusions or deformity Ext no LE or UE edema / no wounds or ulcers Neuro is alert, Ox 3 , nf   Na 135  K 7.9  CO2 12  BUN 76  Cr 3.2 Alb 3.2   LFT's ok   WBC 10k  HB 13  plt 320  Assessment: 1. AKI - functional AKI due to volume depletion/ NSAID's/ ACEi's. Resolving, making urine, creat and K better. In future w high risk of dehydration would avoid all ACEi/ARB's and all NSAID's if possible. Have d/w primary MD.  Will sign off.  2. Metastatic lung cancer - recent diagnosis, has not started Rx yet; bony mets mostly  3. HTN - BP's better, reinstitute BP meds as needed, avoid SBP's < 110 in recovery phase 4. DM2 - on metformin/ Actos at home, hold metformin due to AKI 5. Vol depletion -  resolved 6. Confusion - sundowning vs meds   Plan - as above   Kelly Splinter MD [facility] pager [phone]   05/25/2016, 11:38 AM    Recent Labs Lab 05/24/16 1739 05/24/16 1957 05/25/16 0622  NA 138 131* 136  K 6.8* 6.6* 5.2*  CL 115* 107 107  CO2 15* 15* 19*  GLUCOSE 63* 194* 264*  BUN 77* 72* 52*  CREATININE 2.86* 2.55* 1.73*  CALCIUM 9.4 9.2 9.7    Recent Labs Lab 05/24/16 1324  AST 8  ALT 13  ALKPHOS 118  BILITOT 0.44  PROT 8.2  ALBUMIN 3.2*    Recent Labs Lab 05/24/16 1324 05/25/16 0247  WBC 10.6* 12.8*  NEUTROABS 6.5  --   HGB 13.6 12.4*  HCT 40.3 36.8*  MCV 95.5 92.5  PLT 320 306   Iron/TIBC/Ferritin/ %Sat No results found for: IRON, TIBC, FERRITIN, IRONPCTSAT

## 2016-05-25 NOTE — Progress Notes (Signed)
[facility]                                                                              Patient Demographics  Alexander Landry, is a 82 y.o. male, DOB - 05-Jul-1933, OFH:[id number]  Admit date - 05/24/2016   Admitting Physician Dron Tanna Furry, MD  Outpatient Primary MD for the patient is [name], MD  Outpatient specialists:   LOS - 1  days    Chief Complaint  Patient presents with  . hyperkalemia       Brief summary    [name] is a 82 y.o. male with medical history significant of hypertension, diabetes, acid reflux, kyphosis, history of Hodgkin's lymphoma is status post systemic chemotherapy about 20 years ago, recently diagnosed with metastatic bronchogenic adenocarcinoma, sent from oncology clinic for serum potassium level of 8. Patient was recently complaining of chest pain when CT scan of chest was done which was consistent with poorly nodule. Patient underwent PET scan and CT guided core biopsy of the right lower lung by IR on October 30. The final pathology result came back adenocarcinoma. Patient came to oncology clinic first time today for initiation of treatment. At oncology clinic, the lab work up was done. Potassium level was found to be 8 with creatinine of 3.2. Patient was sent to ED, was evaluated by nephrology and was transferred to [facility] for further workup.    Assessment & Plan   Severe hyperkalemia: In the setting of renal failure, lisinopril. -  EKG with peaked T wave. Patient received medical management including albuterol, bicarbonate, insulin, dextrose, IV fluid and Kayexalate. - Currently on sodium bicarbonate drip - Nephrology following, potassium improving, 5.2 today, creatinine improving to 1.7 today  Acute kidney injury in the setting of severe dehydration, lisinopril, NSAIDS - Continue IV fluids, sodium bicarbonate drip - Discussed with nephrology, Dr. Jonnie Finner, recommended continue bicarbonate drip for  1-2 days until renal function has completely resolved  - No ACE inhibitor or NSAIDs in future  Acute encephalopathy - Unclear baseline however will stop marinol and tramadol for now and reassess   Metastatic lung cancer: Patient recently had an biopsy and now undergoing treatment evaluation by his oncologist, [name], seen yesterday and radiation oncologist.  - cont outpatient follow-up  Diabetes mellitus type 2 with complications [facility]): - Hold oral hypoglycemics  - Increase sliding scale to moderate, add meal coverage  Hypotension likely hypovolemic: - BP is now stable, continue IV fluids   moderate protein calorie malnutrition:  - Nutrition consult  Code Status: Full CODE STATUS DVT Prophylaxis:  SCD's Family Communication: Discussed in detail with the patient, all imaging results, lab results explained to the  wife at the bedside   Disposition Plan: Time Spent in minutes   25 minutes  Procedures:  None  Consultants:   Nephrology  Antimicrobials:      Medications  Scheduled Meds: . insulin aspart  0-9 Units Subcutaneous TID WC  . multivitamin with minerals  1 tablet Oral Daily  . pantoprazole  40 mg Oral Daily  . simvastatin  20 mg Oral q1800  . sodium chloride flush  3  mL Intravenous Q12H   Continuous Infusions: .  sodium bicarbonate  infusion 1000 mL 125 mL/hr at 05/25/16 0300   PRN Meds:.acetaminophen, HYDROcodone-acetaminophen, ondansetron **OR** ondansetron (ZOFRAN) IV, polyethylene glycol   Antibiotics   Anti-infectives    None        Subjective:   Alexander Landry was seen and examined today.  Somewhat confused, wife at the bedside. Knows that he is in the hospital but unable to provide any review of system. Oriented to himself and his wife. Wants to take the mittens off.   Objective:   Vitals:   05/24/16 2230 05/24/16 2338 05/25/16 0300 05/25/16 0740  BP: 117/68 118/68 (!) 142/50 (!) 147/86  Pulse:    (!) 107  Resp: 21 (!) 28  (!) 38 (!) 31  Temp:  99.8 F (37.7 C) 98.7 F (37.1 C) 99.5 F (37.5 C)  TempSrc:  Oral Oral Oral  SpO2:  96% 92% 93%  Weight:  87 kg (191 lb 12.8 oz)    Height:  '6\' 3"'$  (1.905 m)      Intake/Output Summary (Last 24 hours) at 05/25/16 1017 Last data filed at 05/25/16 0800  Gross per 24 hour  Intake             3000 ml  Output              150 ml  Net             2850 ml     Wt Readings from Last 3 Encounters:  05/24/16 87 kg (191 lb 12.8 oz)  05/24/16 86.9 kg (191 lb 8 oz)  05/10/16 93.9 kg (207 lb)     Exam  General: Alert and oriented x 2, NAD, confused   HEENT:    Neck:   Cardiovascular: S1 S2 auscultated, no rubs, murmurs or gallops. Regular rate and rhythm.  Respiratory: Clear to auscultation bilaterally, no wheezing, rales or rhonchi  Gastrointestinal: Soft, nontender, nondistended, + bowel sounds  Ext: no cyanosis clubbing or edema  Neuro: difficult to assess  Skin: No rashes  Psych: anxious and confused   Data Reviewed:  I have personally reviewed following labs and imaging studies  Micro Results Recent Results (from the past 240 hour(s))  MRSA PCR Screening     Status: None   Collection Time: 05/24/16 11:24 PM  Result Value Ref Range Status   MRSA by PCR NEGATIVE NEGATIVE Final    Comment:        The GeneXpert MRSA Assay (FDA approved for NASAL specimens only), is one component of a comprehensive MRSA colonization surveillance program. It is not intended to diagnose MRSA infection nor to guide or monitor treatment for MRSA infections.     Radiology Reports US Renal  Result Date: 05/24/2016 CLINICAL DATA:  Acute kidney injury.  Abnormal labs today. EXAM: RENAL / URINARY TRACT ULTRASOUND COMPLETE COMPARISON:  PET-CT 05/03/2016 FINDINGS: Right Kidney: Length: 11.8 cm. Echogenicity within normal limits. No mass or hydronephrosis visualized. Left Kidney: Length: 12.2 cm. Echogenicity is normal. No hydronephrosis. A simple cyst is  identified in the lower pole region measuring 2.9 x 2.6 x 2.3 cm. Bladder: Appears normal for degree of bladder distention. Bilateral ureteral jets are visualized. IMPRESSION: 1. No hydronephrosis. 2. Simple cyst in the lower pole the left kidney. Electronically Signed   By: Nolon Nations M.D.   On: 05/24/2016 18:42   Nm Pet Image Initial (pi) Skull Base To Thigh  Result Date: 05/03/2016 CLINICAL DATA:  Initial treatment  strategy for right pulmonary nodules on recent chest CT performed in the setting of chest pain. Remote history of Hodgkin's lymphoma. Former smoker. EXAM: NUCLEAR MEDICINE PET SKULL BASE TO THIGH TECHNIQUE: 11.7 mCi F-18 FDG was injected intravenously. Full-ring PET imaging was performed from the skull base to thigh after the radiotracer. CT data was obtained and used for attenuation correction and anatomic localization. FASTING BLOOD GLUCOSE:  Value: 177 mg/dl COMPARISON:  04/22/2016 chest CT. FINDINGS: NECK No hypermetabolic lymph nodes in the neck. Relatively symmetric hypermetabolism in the glottis without CT correlate, favor physiologic uptake. Mildly enlarged heterogeneous thyroid gland with no hypermetabolic thyroid nodules. CHEST Left main, left anterior descending, left circumflex and right coronary atherosclerosis. Atherosclerotic nonaneurysmal thoracic aorta. No pleural effusions. Hypermetabolic posterior right lower lobe 2.4 x 1.5 cm pulmonary nodule (series 8/image 39) with max SUV 5.6, stable in size since 04/22/2016. Right middle lobe 7 mm solid pulmonary nodule (series 8/image 47), below PET resolution, not associated with significant metabolism. Ill-defined 2.6 x 1.2 cm right upper lobe ground-glass nodule (series 8/image 20) with associated low level metabolism (max SUV 2.3). No acute consolidative airspace disease or additional significant pulmonary nodules. Relatively symmetric mild reticulonodular opacities at the lung apices are consistent with pleural-parenchymal  scarring. Mild centrilobular emphysema. Hypermetabolic right hilar adenopathy with max SUV 7.9, poorly delineated on the noncontrast CT images. No hypermetabolic axillary, mediastinal or left hilar nodes. ABDOMEN/PELVIS No abnormal hypermetabolic activity within the liver, pancreas, adrenal glands, or spleen. No hypermetabolic lymph nodes in the abdomen or pelvis. Cholecystectomy. Simple 2.7 cm renal cyst in the lateral lower left kidney. Atherosclerotic nonaneurysmal abdominal aorta. SKELETON There is faintly sclerotic hypermetabolic sternal osseous lesion with max SUV 10.7. There are several hypermetabolic osseous lesions in the thoracolumbar spine with associated faint sclerosis on the CT images, for example max SUV 9.1 in the T11 vertebral body and max SUV 9.4 in the right L3 posterior elements. There are hypermetabolic osseous lesions in the bilateral shoulder girdles and right ribs, for example max SUV 7.1 in the right second rib and max SUV 7.1 in the left superior scapula. There are hypermetabolic skeletal foci in the left anterior acetabulum (max SUV 7.9) and medial right iliac bone. Multiple pins are seen in the right femoral neck. IMPRESSION: 1. Hypermetabolic 2.4 cm posterior right lower lobe pulmonary nodule, most consistent with primary bronchogenic carcinoma. 2. Hypermetabolic right hilar lymphadenopathy, consistent with nodal metastasis. No hypermetabolic mediastinal or contralateral hilar adenopathy. 3. Multifocal hypermetabolic osseous lesions throughout the axial skeleton and bilateral shoulder girdles, most prominent in the sternum and thoracic spine with associated faint sclerosis on the CT images, most consistent with multifocal osseous metastatic disease. 4. Subcentimeter right middle lobe pulmonary nodule, below PET resolution, indeterminate. Ill-defined ground-glass right upper lobe 2.3 cm pulmonary nodule with low level metabolism, cannot exclude indolent adenocarcinoma. Recommend follow-up  of these nodules on chest CT in 3-6 months. 5. Additional findings include aortic atherosclerosis, coronary atherosclerosis and mild emphysema. Electronically Signed   By: Ilona Sorrel M.D.   On: 05/03/2016 16:47   Ct Biopsy  Result Date: 05/10/2016 INDICATION: 81 year old with a suspicious nodule in the right lower lobe and multiple bone lesions. Findings are concerning for lung cancer. Tissue diagnosis is needed. EXAM: CT-GUIDED CORE BIOPSY OF RIGHT LOWER LOBE LUNG NODULE MEDICATIONS: None. ANESTHESIA/SEDATION: Moderate (conscious) sedation was employed during this procedure. A total of Versed 1.0 mg and Fentanyl 25 mcg was administered intravenously. Moderate Sedation Time: 15 minutes. The patient's level of consciousness  and vital signs were monitored continuously by radiology nursing throughout the procedure under my direct supervision. FLUOROSCOPY TIME:  None COMPLICATIONS: None immediate. PROCEDURE: Informed written consent was obtained from the patient after a thorough discussion of the procedural risks, benefits and alternatives. All questions were addressed. A timeout was performed prior to the initiation of the procedure. Patient was placed on his right side. CT images through the chest were obtained. The nodule along the posterior right lower lobe was identified. Overlying skin was cleansed with chlorhexidine and sterile field was created. Skin was anesthetized with 1% lidocaine. A 17 gauge coaxial needle was directed into the pulmonary nodule with CT guidance. Needle position confirmed within the lesion. Two core biopsies were obtained with an 18 gauge core device. Specimens placed in formalin. 21 gauge needle was removed with the BiosSentry tract sealant. 17 gauge needle removed without complication. Bandage placed over the puncture site. FINDINGS: 2.4 cm pleural-based nodule in the right lower lobe. Needle position confirmed within this lesion. No significant pneumothorax or hemorrhage following  the core biopsies. IMPRESSION: Successful CT-guided core biopsies of the right lower lobe nodule. Electronically Signed   By: Markus Daft M.D.   On: 05/10/2016 16:23   Dg Chest Port 1 View  Result Date: 05/10/2016 CLINICAL DATA:  Status post RIGHT lung biopsy. EXAM: PORTABLE CHEST 1 VIEW COMPARISON:  Chest radiograph March 25, 2016 FINDINGS: Mildly tortuous aorta associated with hypertension. Cardiac silhouette is normal in size. Similar prominent interstitial markings and increased lung volumes most compatible with COPD. LEFT lung base atelectasis/ scarring. No pleural effusion. No pneumothorax. Soft tissue planes and included osseous structures are nonsuspicious. IMPRESSION: No pneumothorax. COPD.  LEFT lung base atelectasis/scarring. Electronically Signed   By: Elon Alas M.D.   On: 05/10/2016 14:33    Lab Data:  CBC:  Recent Labs Lab 05/24/16 1324 05/25/16 0247  WBC 10.6* 12.8*  NEUTROABS 6.5  --   HGB 13.6 12.4*  HCT 40.3 36.8*  MCV 95.5 92.5  PLT 320 638   Basic Metabolic Panel:  Recent Labs Lab 05/24/16 1324 05/24/16 1739 05/24/16 1957 05/25/16 0622  NA 135* 138 131* 136  K 7.9 3 specimens. repeated X2 each.* 6.8* 6.6* 5.2*  CL  --  115* 107 107  CO2 12* 15* 15* 19*  GLUCOSE 196* 63* 194* 264*  BUN 75.9* 77* 72* 52*  CREATININE 3.2* 2.86* 2.55* 1.73*  CALCIUM 10.5* 9.4 9.2 9.7   GFR: Estimated Creatinine Clearance: 40 mL/min (by C-G formula based on SCr of 1.73 mg/dL (H)). Liver Function Tests:  Recent Labs Lab 05/24/16 1324  AST 8  ALT 13  ALKPHOS 118  BILITOT 0.44  PROT 8.2  ALBUMIN 3.2*   No results for input(s): LIPASE, AMYLASE in the last 168 hours. No results for input(s): AMMONIA in the last 168 hours. Coagulation Profile: No results for input(s): INR, PROTIME in the last 168 hours. Cardiac Enzymes: No results for input(s): CKTOTAL, CKMB, CKMBINDEX, TROPONINI in the last 168 hours. BNP (last 3 results) No results for input(s):  PROBNP in the last 8760 hours. HbA1C: No results for input(s): HGBA1C in the last 72 hours. CBG:  Recent Labs Lab 05/24/16 1555 05/24/16 1711 05/24/16 1734 05/24/16 1950 05/25/16 0847  GLUCAP 126* 103* 89 168* 274*   Lipid Profile: No results for input(s): CHOL, HDL, LDLCALC, TRIG, CHOLHDL, LDLDIRECT in the last 72 hours. Thyroid Function Tests: No results for input(s): TSH, T4TOTAL, FREET4, T3FREE, THYROIDAB in the last 72 hours. Anemia Panel:  No results for input(s): VITAMINB12, FOLATE, FERRITIN, TIBC, IRON, RETICCTPCT in the last 72 hours. Urine analysis:    Component Value Date/Time   COLORURINE YELLOW 05/24/2016 2333   APPEARANCEUR CLEAR 05/24/2016 2333   LABSPEC 1.015 05/24/2016 2333   PHURINE 5.5 05/24/2016 [address] 05/24/2016 2333   HGBUR NEGATIVE 05/24/2016 2333   BILIRUBINUR NEGATIVE 05/24/2016 2333   BILIRUBINUR neg 07/03/2014 [place] 05/24/2016 2333   PROTEINUR NEGATIVE 05/24/2016 2333   UROBILINOGEN negative 07/03/2014 1320   NITRITE NEGATIVE 05/24/2016 2333   LEUKOCYTESUR NEGATIVE 05/24/2016 2333     RAI,RIPUDEEP M.D. [facility] 05/25/2016, 10:17 AM  Pager: [phone] Between 7am to 7pm - call Pager - [phone]  After 7pm go to [email or web address] - password [email or web address]  Call night coverage person covering after 7pm

## 2016-05-26 ENCOUNTER — Other Ambulatory Visit (HOSPITAL_COMMUNITY)
Admission: RE | Admit: 2016-05-26 | Discharge: 2016-05-26 | Disposition: A | Discharge status: Home / Self Care | Payer: Commercial Managed Care - HMO | Source: Ambulatory Visit | Attending: Internal Medicine | Admitting: Internal Medicine

## 2016-05-26 DIAGNOSIS — E43 Unspecified severe protein-calorie malnutrition: Secondary | ICD-10-CM

## 2016-05-26 DIAGNOSIS — I959 Hypotension, unspecified: Secondary | ICD-10-CM

## 2016-05-26 DIAGNOSIS — C3431 Malignant neoplasm of lower lobe, right bronchus or lung: Secondary | ICD-10-CM | POA: Insufficient documentation

## 2016-05-26 LAB — HEMOGLOBIN A1C
Hgb A1c MFr Bld: 6.8 % — ABNORMAL HIGH (ref 4.8–5.6)
Mean Plasma Glucose: 148 mg/dL

## 2016-05-26 LAB — CBC
HCT: 37.8 % — ABNORMAL LOW (ref 39.0–52.0)
Hemoglobin: 12.9 g/dL — ABNORMAL LOW (ref 13.0–17.0)
MCH: 31.9 pg (ref 26.0–34.0)
MCHC: 34.1 g/dL (ref 30.0–36.0)
MCV: 93.3 fL (ref 78.0–100.0)
Platelets: 264 10*3/uL (ref 150–400)
RBC: 4.05 MIL/uL — ABNORMAL LOW (ref 4.22–5.81)
RDW: 14.5 % (ref 11.5–15.5)
WBC: 13.3 10*3/uL — ABNORMAL HIGH (ref 4.0–10.5)

## 2016-05-26 LAB — BASIC METABOLIC PANEL
Anion gap: 13 (ref 5–15)
BUN: 38 mg/dL — ABNORMAL HIGH (ref 6–20)
CO2: 29 mmol/L (ref 22–32)
Calcium: 9.2 mg/dL (ref 8.9–10.3)
Chloride: 101 mmol/L (ref 101–111)
Creatinine, Ser: 1.29 mg/dL — ABNORMAL HIGH (ref 0.61–1.24)
GFR calc Af Amer: 58 mL/min — ABNORMAL LOW (ref >60)
GFR calc non Af Amer: 50 mL/min — ABNORMAL LOW (ref >60)
Glucose, Bld: 186 mg/dL — ABNORMAL HIGH (ref 65–99)
Potassium: 3.6 mmol/L (ref 3.5–5.1)
Sodium: 143 mmol/L (ref 135–145)

## 2016-05-26 LAB — GLUCOSE, CAPILLARY
GLUCOSE-CAPILLARY: 183 mg/dL — AB (ref 65–99)
Glucose-Capillary: 166 mg/dL — ABNORMAL HIGH (ref 65–99)
Glucose-Capillary: 179 mg/dL — ABNORMAL HIGH (ref 65–99)
Glucose-Capillary: 109 mg/dL — ABNORMAL HIGH (ref 65–99)

## 2016-05-26 MED ORDER — ATENOLOL 50 MG PO TABS
50.0000 mg | ORAL_TABLET | Freq: Two times a day (BID) | ORAL | Status: DC
  Administered 2016-05-26 – 2016-05-30 (×10): 50 mg via ORAL
  Filled 2016-05-26 (×12): qty 1

## 2016-05-26 MED ORDER — METOPROLOL TARTRATE 5 MG/5ML IV SOLN
5.0000 mg | Freq: Four times a day (QID) | INTRAVENOUS | Status: DC | PRN
  Administered 2016-05-26: 5 mg via INTRAVENOUS
  Filled 2016-05-26: qty 5

## 2016-05-26 NOTE — Progress Notes (Signed)
Physical Therapy Treatment Patient Details Name: Alexander Landry MRN: [id number] DOB: 08/28/1932 Today's Date: 05/26/2016    History of Present Illness 82 y.o. Male with PMH significant of hypertension, diabetes, acid reflux, kyphosis, history of Hodgkin's lymphoma is status post systemic chemotherapy about 20 years ago, recently diagnosed with metastatic bronchogenic adenocarcinoma, sent from oncology clinic for serum potassium level of 8    PT Comments    Pt presented R sidelying in bed with wife and family members present throughout. Pt was awake and willing to participate in therapy session. Pt limited again this session secondary to heart rhythm transitioning into a-fib upon sitting EOB with HR fluctuating, increasing into the 140s. PT and pt's wife discussed DME needs and HH PT services. PT answered all of pt and pt's family's questions at the end of the session. Pt would continue to benefit from skilled physical therapy services at this time while admitted and after d/c to address his limitations in order to improve his overall safety and independence with functional mobility.   Follow Up Recommendations  Home health PT;Supervision/Assistance - 24 hour     Equipment Recommendations  None recommended by PT    Recommendations for Other Services       Precautions / Restrictions Precautions Precautions: Fall Precaution Comments: watch HR    Mobility  Bed Mobility Overal bed mobility: Needs Assistance Bed Mobility: Supine to Sit;Sit to Supine     Supine to sit: Min guard Sit to supine: Min guard      Transfers                 General transfer comment: deferred secondary to heart rhythm transitioned into a fib in sitting, with HR fluctuating (increasing as high as into the 140s)  Ambulation/Gait             General Gait Details: deferred due to transition to afib with HR fluctuating into 140s    Stairs            Wheelchair Mobility     Modified Rankin (Stroke Patients Only)       Balance Overall balance assessment: Needs assistance Sitting-balance support: Feet supported;No upper extremity supported Sitting balance-Leahy Scale: Good Sitting balance - Comments: pt able to weight shift laterally, posteriorly and anteriorly without UE supports and without LOB. pt also able to sustain upright sitting balance while alternating lifting LEs off of floor                            Cognition Arousal/Alertness: Awake/alert Behavior During Therapy: WFL for tasks assessed/performed Overall Cognitive Status: Impaired/Different from baseline Area of Impairment: Attention;Memory;Safety/judgement;Awareness   Current Attention Level: Selective Memory: Decreased short-term memory   Safety/Judgement: Decreased awareness of safety;Decreased awareness of deficits Awareness: Emergent        Exercises      General Comments        Pertinent Vitals/Pain Pain Assessment: No/denies pain    Home Living                      Prior Function            PT Goals (current goals can now be found in the care plan section) Acute Rehab PT Goals Patient Stated Goal: to go home PT Goal Formulation: With patient/family Time For Goal Achievement: 06/08/16 Potential to Achieve Goals: Good Progress towards PT goals: Progressing toward goals    Frequency  Min 3X/week      PT Plan Current plan remains appropriate    Co-evaluation             End of Session   Activity Tolerance: Treatment limited secondary to medical complications (Comment);Other (comment) (treatment limited secondary to a-fib and increase in HR) Patient left: in bed;with call bell/phone within reach;with family/visitor present     Time: 1410-1431 PT Time Calculation (min) (ACUTE ONLY): 21 min  Charges:  $Therapeutic Activity: 8-22 mins                    G CodesClearnce Sorrel Blayze Haen 16-Jun-2016, 3:05 PM Sherie Don,  [place], DPT [phone]

## 2016-05-26 NOTE — Progress Notes (Signed)
Pt mitts were applied to prevent pt from dislodging IV's at start of shift. Pt. Mitts removed. Pt is calm and cooperative.

## 2016-05-26 NOTE — Progress Notes (Signed)
PROGRESS NOTE    Alexander Landry  TWS:[id number] DOB: 1932/12/05 DOA: 05/24/2016 PCP: [name], MD    Brief Narrative:  Alexander Him Flournoyis a 82 y.o.malewith medical history significant of hypertension, diabetes, acid reflux, kyphosis, history of Hodgkin's lymphoma is status post systemic chemotherapy about 20 years ago, recently diagnosed with metastatic bronchogenic adenocarcinoma, sent from oncology clinic for serum potassium level of 8. Patient was recently complaining of chest pain when CT scan of chest was done which was consistent with poorly nodule. Patient underwent PET scan and CT guided core biopsy of the right lower lung by IR on October 30. The final pathology result came back adenocarcinoma. Patient came to oncology clinic first time today for initiation of treatment. At oncology clinic, the lab work up was done. Potassium level was found to be 8 with creatinine of 3.2. Patient was sent to ED, was evaluated by nephrology and was transferred to [facility] for further workup.      Assessment & Plan:   Principal Problem:   Hyperkalemia Active Problems:   Acute kidney injury ([place])   Diabetes mellitus type 2 with complications ([place])   Hypertension associated with diabetes ([place])   History of Hodgkin's lymphoma   Protein-calorie malnutrition, severe  #1 severe hyperkalemia in the setting of acute renal failure and ACE inhibitor EKG did show peaked T waves. Patient received medical management of albuterol, bicarbonate, insulin, dextrose, IV fluids and Kayexalate. Patient currently on a sodium bicarbonate drip. Hyperkalemia improving daily. Labs pending for today. Nephrology assess the patient during this hospitalization. Follow.  #2 acute kidney injury in the setting of severe dehydration, ACE inhibitor, NSAIDs Improving. Labs pending this morning. Patient on IV fluids, sodium bicarbonate drip. No ACE inhibitor so NSAIDs in the future.Dr Tana Coast discussed with  nephrology, Dr Jonnie Finner who had recommended continuing bicarbonate drip for 1-2 days until renal function not completely resolved. Follow.  #3 acute encephalopathy Likely medication induced. Patient with clinical improvement. Will discontinue maintenance today. Marinol and tramadol were discontinued with clinical improvement. Follow for now.  #4 moderate protein calorie malnutrition Nutritional supplementations.  #5 diabetes mellitus type 2 CBGs have ranged from 135 - 166. Continue sliding scale insulin. Oral hypoglycemic agents on hold.  #6 metastatic lung cancer Patient recently had biopsy and undergoing treatment evaluation per oncology, Dr. [name] as well as radiation oncologist. Outpatient follow-up.  #7 hypotension Secondary to hypovolemia. Improved with IV fluids. Follow.     DVT prophylaxis: SCDs Code Status: Full Family Communication: Updated patient and wife at bedside. Disposition Plan: Home when confusion has improved. Renal function has improved and hyperkalemia resolved. Hopefully in 24-48 hours.   Consultants:   Nephrology: Dr.Schertz 05/24/2016  Procedures:   Renal ultrasound 05/24/2016  Antimicrobials:  None    Subjective: Sleepy and easily arousable. Following commands. Alert and oriented to self place and time. Wife states they were hoping to go home today. [place] on.  Objective: Vitals:   05/25/16 2357 05/26/16 0349 05/26/16 0718 05/26/16 0745  BP: (!) 145/64  128/67   Pulse: (!) 112  (!) 101 98  Resp: 20  (!) 21 18  Temp: 98.7 F (37.1 C) 99 F (37.2 C) 99.3 F (37.4 C)   TempSrc: Oral Axillary Oral   SpO2: 93%  93% 91%  Weight:      Height:        Intake/Output Summary (Last 24 hours) at 05/26/16 1023 Last data filed at 05/26/16 0745  Gross per 24 hour  Intake  1922.08 ml  Output              450 ml  Net          1472.08 ml   Filed Weights   05/24/16 2338  Weight: 87 kg (191 lb 12.8 oz)    Examination:  General  exam: Appears calm and comfortable  Respiratory system: Clear to auscultation. Respiratory effort normal. Cardiovascular system: S1 & S2 heard, RRR. No JVD, murmurs, rubs, gallops or clicks. No pedal edema. Gastrointestinal system: Abdomen is nondistended, soft and nontender. No organomegaly or masses felt. Normal bowel sounds heard. Central nervous system: Alert and oriented. No focal neurological deficits. Extremities: Symmetric 5 x 5 power. Skin: No rashes, lesions or ulcers Psychiatry: Judgement and insight appear normal. Mood & affect appropriate.     Data Reviewed: I have personally reviewed following labs and imaging studies  CBC:  Recent Labs Lab 05/24/16 1324 05/25/16 0247 05/26/16 0328  WBC 10.6* 12.8* 13.3*  NEUTROABS 6.5  --   --   HGB 13.6 12.4* 12.9*  HCT 40.3 36.8* 37.8*  MCV 95.5 92.5 93.3  PLT 320 306 119   Basic Metabolic Panel:  Recent Labs Lab 05/24/16 1324 05/24/16 1739 05/24/16 1957 05/25/16 0622 05/26/16 0930  NA 135* 138 131* 136 143  K 7.9 3 specimens. repeated X2 each.* 6.8* 6.6* 5.2* 3.6  CL  --  115* 107 107 101  CO2 12* 15* 15* 19* 29  GLUCOSE 196* 63* 194* 264* 186*  BUN 75.9* 77* 72* 52* 38*  CREATININE 3.2* 2.86* 2.55* 1.73* 1.29*  CALCIUM 10.5* 9.4 9.2 9.7 9.2   GFR: Estimated Creatinine Clearance: 53.7 mL/min (by C-G formula based on SCr of 1.29 mg/dL (H)). Liver Function Tests:  Recent Labs Lab 05/24/16 1324  AST 8  ALT 13  ALKPHOS 118  BILITOT 0.44  PROT 8.2  ALBUMIN 3.2*   No results for input(s): LIPASE, AMYLASE in the last 168 hours. No results for input(s): AMMONIA in the last 168 hours. Coagulation Profile: No results for input(s): INR, PROTIME in the last 168 hours. Cardiac Enzymes: No results for input(s): CKTOTAL, CKMB, CKMBINDEX, TROPONINI in the last 168 hours. BNP (last 3 results) No results for input(s): PROBNP in the last 8760 hours. HbA1C:  Recent Labs  05/25/16 1245  HGBA1C 6.8*    CBG:  Recent Labs Lab 05/25/16 0847 05/25/16 1143 05/25/16 1615 05/25/16 2124 05/26/16 0824  GLUCAP 274* 146* 135* 165* 166*   Lipid Profile: No results for input(s): CHOL, HDL, LDLCALC, TRIG, CHOLHDL, LDLDIRECT in the last 72 hours. Thyroid Function Tests: No results for input(s): TSH, T4TOTAL, FREET4, T3FREE, THYROIDAB in the last 72 hours. Anemia Panel: No results for input(s): VITAMINB12, FOLATE, FERRITIN, TIBC, IRON, RETICCTPCT in the last 72 hours. Sepsis Labs: No results for input(s): PROCALCITON, LATICACIDVEN in the last 168 hours.  Recent Results (from the past 240 hour(s))  MRSA PCR Screening     Status: None   Collection Time: 05/24/16 11:24 PM  Result Value Ref Range Status   MRSA by PCR NEGATIVE NEGATIVE Final    Comment:        The GeneXpert MRSA Assay (FDA approved for NASAL specimens only), is one component of a comprehensive MRSA colonization surveillance program. It is not intended to diagnose MRSA infection nor to guide or monitor treatment for MRSA infections.          Radiology Studies: US Renal  Result Date: 05/24/2016 CLINICAL DATA:  Acute kidney injury.  Abnormal labs today. EXAM: RENAL / URINARY TRACT ULTRASOUND COMPLETE COMPARISON:  PET-CT 05/03/2016 FINDINGS: Right Kidney: Length: 11.8 cm. Echogenicity within normal limits. No mass or hydronephrosis visualized. Left Kidney: Length: 12.2 cm. Echogenicity is normal. No hydronephrosis. A simple cyst is identified in the lower pole region measuring 2.9 x 2.6 x 2.3 cm. Bladder: Appears normal for degree of bladder distention. Bilateral ureteral jets are visualized. IMPRESSION: 1. No hydronephrosis. 2. Simple cyst in the lower pole the left kidney. Electronically Signed   By: Nolon Nations M.D.   On: 05/24/2016 18:42        Scheduled Meds: . feeding supplement (ENSURE ENLIVE)  237 mL Oral BID BM  . insulin aspart  0-15 Units Subcutaneous TID WC  . insulin aspart  0-5 Units  Subcutaneous QHS  . insulin aspart  3 Units Subcutaneous TID WC  . multivitamin with minerals  1 tablet Oral Daily  . pantoprazole  40 mg Oral Daily  . simvastatin  20 mg Oral q1800  . sodium chloride flush  3 mL Intravenous Q12H   Continuous Infusions: .  sodium bicarbonate  infusion 1000 mL 75 mL/hr at 05/26/16 0545     LOS: 2 days    Time spent: 81 mins    THOMPSON,DANIEL, MD [facility] Pager [phone]  If 7PM-7AM, please contact night-coverage [email or web address] Password [email or web address] 05/26/2016, 10:23 AM

## 2016-05-27 ENCOUNTER — Telehealth: Payer: Self-pay | Admitting: *Deleted

## 2016-05-27 ENCOUNTER — Ambulatory Visit: Payer: Commercial Managed Care - HMO | Admitting: Radiation Oncology

## 2016-05-27 ENCOUNTER — Inpatient Hospital Stay (HOSPITAL_COMMUNITY): Payer: Commercial Managed Care - HMO

## 2016-05-27 ENCOUNTER — Ambulatory Visit
Admit: 2016-05-27 | Discharge: 2016-05-27 | Disposition: A | Discharge status: Home / Self Care | Payer: Commercial Managed Care - HMO | Attending: Radiation Oncology | Admitting: Radiation Oncology

## 2016-05-27 DIAGNOSIS — J189 Pneumonia, unspecified organism: Secondary | ICD-10-CM | POA: Clinically undetermined

## 2016-05-27 DIAGNOSIS — C3490 Malignant neoplasm of unspecified part of unspecified bronchus or lung: Secondary | ICD-10-CM

## 2016-05-27 DIAGNOSIS — C3491 Malignant neoplasm of unspecified part of right bronchus or lung: Secondary | ICD-10-CM

## 2016-05-27 DIAGNOSIS — C3431 Malignant neoplasm of lower lobe, right bronchus or lung: Secondary | ICD-10-CM

## 2016-05-27 DIAGNOSIS — C7951 Secondary malignant neoplasm of bone: Secondary | ICD-10-CM | POA: Insufficient documentation

## 2016-05-27 LAB — INFLUENZA PANEL BY PCR (TYPE A & B)
INFLAPCR: NEGATIVE
Influenza B By PCR: NEGATIVE

## 2016-05-27 LAB — BASIC METABOLIC PANEL
Anion gap: 7 (ref 5–15)
BUN: 38 mg/dL — ABNORMAL HIGH (ref 6–20)
CHLORIDE: 98 mmol/L — AB (ref 101–111)
CO2: 38 mmol/L — AB (ref 22–32)
Calcium: 9.1 mg/dL (ref 8.9–10.3)
Creatinine, Ser: 1.48 mg/dL — ABNORMAL HIGH (ref 0.61–1.24)
GFR calc non Af Amer: 43 mL/min — ABNORMAL LOW (ref >60)
GFR, EST AFRICAN AMERICAN: 49 mL/min — AB (ref >60)
Glucose, Bld: 185 mg/dL — ABNORMAL HIGH (ref 65–99)
POTASSIUM: 4.3 mmol/L (ref 3.5–5.1)
SODIUM: 143 mmol/L (ref 135–145)

## 2016-05-27 LAB — GLUCOSE, CAPILLARY
GLUCOSE-CAPILLARY: 179 mg/dL — AB (ref 65–99)
Glucose-Capillary: 216 mg/dL — ABNORMAL HIGH (ref 65–99)
Glucose-Capillary: 118 mg/dL — ABNORMAL HIGH (ref 65–99)
Glucose-Capillary: 144 mg/dL — ABNORMAL HIGH (ref 65–99)
Glucose-Capillary: 157 mg/dL — ABNORMAL HIGH (ref 65–99)

## 2016-05-27 LAB — CBC
HEMATOCRIT: 38.8 % — AB (ref 39.0–52.0)
HEMOGLOBIN: 12.8 g/dL — AB (ref 13.0–17.0)
MCH: 31.4 pg (ref 26.0–34.0)
MCHC: 33 g/dL (ref 30.0–36.0)
MCV: 95.3 fL (ref 78.0–100.0)
Platelets: 272 10*3/uL (ref 150–400)
RBC: 4.07 MIL/uL — AB (ref 4.22–5.81)
RDW: 14.2 % (ref 11.5–15.5)
WBC: 16.1 10*3/uL — ABNORMAL HIGH (ref 4.0–10.5)

## 2016-05-27 LAB — STREP PNEUMONIAE URINARY ANTIGEN: STREP PNEUMO URINARY ANTIGEN: NEGATIVE

## 2016-05-27 MED ORDER — GUAIFENESIN ER 600 MG PO TB12
1200.0000 mg | ORAL_TABLET | Freq: Two times a day (BID) | ORAL | Status: DC
  Administered 2016-05-27 – 2016-05-30 (×8): 1200 mg via ORAL
  Filled 2016-05-27 (×9): qty 2

## 2016-05-27 MED ORDER — GADOBENATE DIMEGLUMINE 529 MG/ML IV SOLN
18.0000 mL | Freq: Once | INTRAVENOUS | Status: AC | PRN
  Administered 2016-05-27: 18 mL via INTRAVENOUS

## 2016-05-27 MED ORDER — FUROSEMIDE 10 MG/ML IJ SOLN
20.0000 mg | Freq: Once | INTRAMUSCULAR | Status: AC
  Administered 2016-05-27: 20 mg via INTRAVENOUS
  Filled 2016-05-27: qty 2

## 2016-05-27 MED ORDER — VANCOMYCIN HCL IN DEXTROSE 750-5 MG/150ML-% IV SOLN
750.0000 mg | Freq: Two times a day (BID) | INTRAVENOUS | Status: DC
  Administered 2016-05-28 – 2016-05-29 (×4): 750 mg via INTRAVENOUS
  Filled 2016-05-27 (×5): qty 150

## 2016-05-27 MED ORDER — AMLODIPINE BESYLATE 5 MG PO TABS
5.0000 mg | ORAL_TABLET | Freq: Every day | ORAL | Status: DC
  Administered 2016-05-27 – 2016-05-30 (×4): 5 mg via ORAL
  Filled 2016-05-27 (×5): qty 1

## 2016-05-27 MED ORDER — VANCOMYCIN HCL 10 G IV SOLR
1500.0000 mg | Freq: Once | INTRAVENOUS | Status: AC
  Administered 2016-05-27: 1500 mg via INTRAVENOUS
  Filled 2016-05-27: qty 1500

## 2016-05-27 MED ORDER — IPRATROPIUM-ALBUTEROL 0.5-2.5 (3) MG/3ML IN SOLN
3.0000 mL | Freq: Four times a day (QID) | RESPIRATORY_TRACT | Status: DC
  Administered 2016-05-27 – 2016-05-29 (×6): 3 mL via RESPIRATORY_TRACT
  Filled 2016-05-27 (×8): qty 3

## 2016-05-27 MED ORDER — CEFEPIME HCL 1 G IJ SOLR
1.0000 g | INTRAMUSCULAR | Status: DC
  Administered 2016-05-27: 1 g via INTRAVENOUS
  Filled 2016-05-27: qty 1

## 2016-05-27 MED ORDER — IPRATROPIUM-ALBUTEROL 0.5-2.5 (3) MG/3ML IN SOLN: 3.0000 mL | RESPIRATORY_TRACT | Status: DC | PRN

## 2016-05-27 MED ORDER — DEXTROSE 5 % IV SOLN
2.0000 g | INTRAVENOUS | Status: DC
  Administered 2016-05-28 – 2016-05-29 (×2): 2 g via INTRAVENOUS
  Filled 2016-05-27 (×3): qty 2

## 2016-05-27 MED ORDER — SODIUM CHLORIDE 0.9 % IV SOLN
INTRAVENOUS | Status: DC
  Administered 2016-05-27: 09:00:00 via INTRAVENOUS

## 2016-05-27 NOTE — Progress Notes (Signed)
Transportation arranged through [facility] 11/17 at 830am. Appointment is at [place].

## 2016-05-27 NOTE — Telephone Encounter (Signed)
[facility] [phone] spoke with Marden Noble, confirmed patient to be here via [facility] transportation tomorrow at 9am,  He was moved to 5  Medical 16-c 5:26 PM

## 2016-05-27 NOTE — Progress Notes (Signed)
Notified Dr. Grandville Silos of patient radiology appt at 230pm today.

## 2016-05-27 NOTE — Care Management Note (Signed)
Case Management Note  Patient Details  Name: Alexander Landry MRN: [id number] Date of Birth: Sep 11, 1932  Subjective/Objective:    Pt lives with wife who, according to staff, has cognitive deficits.  Per attending, pt will need home health PT, OT, aide, and SW.  CM discussed recommended home health services with son, [name], and daughter-in-law, provided list of agencies in [place].  They both stated they wanted pt's wife to make the decision about an agency to provide services, wife unavailable as she was on the telephone.  CM later attempted to talk to wife x 3 and she refused, stating "I can't deal with this."  CM called son, [name], and he stated he and his wife had discussed home health services, they had already arranged aide services through [facility] and decided they did not want any further home health, stated "I think we will be fine."                    Expected Discharge Plan:  Home/Self Care  Discharge planning Services  CM Consult  Choice offered to:  Adult Children, Spouse   Services refused  Status of Service:  Completed, signed off  Girard Cooter, [place] 05/27/2016, 2:18 PM

## 2016-05-27 NOTE — Consult Note (Signed)
Radiation Oncology         [phone] ________________________________  Name: Alexander Landry MRN: [id number]  Date: 05/24/2016  DOB: 07-Oct-1932  HY:[id number],YIRS [name], MD  No ref. provider found     REFERRING PHYSICIAN: No ref. provider found   DIAGNOSIS: The primary encounter diagnosis was Hyperkalemia. Diagnoses of AKI (acute kidney injury) ([place]), Acute renal failure, unspecified acute renal failure type ([place]), Fever, and Lung cancer ([place]) were also pertinent to this visit.   HISTORY OF PRESENT ILLNESS: Alexander Landry is a 82 y.o. male seen at the request of Dr. Julien Nordmann for a new diagnosis of lung cancer. The patient history of Hodgkin's lymphoma status post chemotherapy over 20 years ago under the care of Dr. Sonny Dandy. He had a CT scan of the chest with contrast on 10/12/2017due to chest pain, which revealed a 2.3 cm irregular pulmonary nodule in the posterior lower lobe on the right in a spiculated nodule in the right lateral lobe consistent with a synchronous primary. Right hilar adenopathy and subcarinal adenopathy was noted and several foci of groundglass opacities in the right upper lobe measuring 1.1 x 2.4 cm were noted. PET scan on 05/07/2016 revealed hypermetabolism with in the 2.4 cm lesion in the right lower lobe, metabolic activity in the right hilar nodes, and focal hypermetabolic osseous lesions most noticed at the sternum, thoracic spine, and right clavicle. A biopsy was obtained on 05/10/2016 with a core specimen from the right lower lobe consistent with adenocarcinoma. He has met with Dr. Julien Nordmann, and during his first consultation had blood work which revealed a potassium of 8. He was scheduled to see same day in the outpatient setting.he has been under the care of nephrology, internal medicine, and has noticed improvement in his potassium as well as acute kidney injury, he continues to have pain in his sternum and right clavicle laterally. Due to the fact that he will  need systemic therapy, but prior to this Dr. Julien Nordmann would like [place] to move forward with palliative radiotherapy to the sternum and clavicle, he is being seen in the inpatient setting.     PREVIOUS RADIATION THERAPY: No   PAST MEDICAL HISTORY:  Past Medical History:  Diagnosis Date  . Adenocarcinoma of right lung, stage 4 ([place]) 05/24/2016  . Diabetes mellitus without complication ([place])   . GERD (gastroesophageal reflux disease)   . Hypertension   . Kyphosis (acquired) ([place])   . Lung cancer ([place]) 05/10/2016   right lower lobe lung=adenocarcinoma  . Medical history non-contributory        PAST SURGICAL HISTORY:History reviewed. No pertinent surgical history.   FAMILY HISTORY:  Family History  Problem Relation Age of Onset  . Kidney cancer Mother      SOCIAL HISTORY:  reports that he quit smoking about 30 years ago. His smoking use included Cigarettes. He has a 40.00 pack-year smoking history. He has never used smokeless tobacco. He reports that he does not drink alcohol or use drugs.   ALLERGIES: Patient has no known allergies.   MEDICATIONS:  Current Facility-Administered Medications  Medication Dose Route Frequency Provider Last Rate Last Dose  . acetaminophen (TYLENOL) tablet 650 mg  650 mg Oral Q4H PRN Ripudeep K Rai, MD      . amLODipine (NORVASC) tablet 5 mg  5 mg Oral Daily Eugenie Filler, MD   5 mg at 05/27/16 1142  . atenolol (TENORMIN) tablet 50 mg  50 mg Oral BID Eugenie Filler, MD   50 mg  at 05/27/16 0824  . [START ON 05/28/2016] ceFEPIme (MAXIPIME) 2 g in dextrose 5 % 50 mL IVPB  2 g Intravenous Q24H Wynell Balloon, RPH      . feeding supplement (ENSURE ENLIVE) (ENSURE ENLIVE) liquid 237 mL  237 mL Oral BID BM Ripudeep K Rai, MD   237 mL at 05/27/16 1000  . guaiFENesin (MUCINEX) 12 hr tablet 1,200 mg  1,200 mg Oral BID Eugenie Filler, MD   1,200 mg at 05/27/16 1142  . haloperidol lactate (HALDOL) injection 0.5 mg  0.5 mg Intravenous Q6H PRN  Ripudeep Krystal Eaton, MD      . HYDROcodone-acetaminophen (NORCO) 7.5-325 MG per tablet 1 tablet  1 tablet Oral Q4H PRN Dron Tanna Furry, MD   1 tablet at 05/27/16 0824  . insulin aspart (novoLOG) injection 0-15 Units  0-15 Units Subcutaneous TID WC Ripudeep Krystal Eaton, MD   5 Units at 05/27/16 1152  . insulin aspart (novoLOG) injection 0-5 Units  0-5 Units Subcutaneous QHS Ripudeep K Rai, MD      . insulin aspart (novoLOG) injection 3 Units  3 Units Subcutaneous TID WC Ripudeep Krystal Eaton, MD   3 Units at 05/27/16 1152  . ipratropium-albuterol (DUONEB) 0.5-2.5 (3) MG/3ML nebulizer solution 3 mL  3 mL Nebulization Q2H PRN Eugenie Filler, MD      . ipratropium-albuterol (DUONEB) 0.5-2.5 (3) MG/3ML nebulizer solution 3 mL  3 mL Nebulization Q6H Eugenie Filler, MD      . metoprolol (LOPRESSOR) injection 5 mg  5 mg Intravenous Q6H PRN Eugenie Filler, MD   5 mg at 05/26/16 1318  . multivitamin with minerals tablet 1 tablet  1 tablet Oral Daily Dron Tanna Furry, MD   1 tablet at 05/27/16 [phone]  . ondansetron (ZOFRAN) tablet 4 mg  4 mg Oral Q6H PRN Dron Tanna Furry, MD       Or  . ondansetron [facility]) injection 4 mg  4 mg Intravenous Q6H PRN Dron Tanna Furry, MD      . pantoprazole (PROTONIX) EC tablet 40 mg  40 mg Oral Daily Dron Tanna Furry, MD   40 mg at 05/27/16 0824  . polyethylene glycol (MIRALAX / GLYCOLAX) packet 17 g  17 g Oral Daily PRN Dron Tanna Furry, MD      . simvastatin (ZOCOR) tablet 20 mg  20 mg Oral q1800 Dron Tanna Furry, MD   20 mg at 05/26/16 1817  . sodium chloride flush (NS) 0.9 % injection 3 mL  3 mL Intravenous Q12H Dron Tanna Furry, MD   3 mL at 05/26/16 1032  . vancomycin (VANCOCIN) 1,500 mg in sodium chloride 0.9 % 500 mL IVPB  1,500 mg Intravenous Once [place], RPH      . [START ON 05/28/2016] vancomycin (VANCOCIN) IVPB 750 mg/150 ml premix  750 mg Intravenous Q12H Rachel L Rumbarger, RPH         REVIEW OF SYSTEMS: On review of systems,  the patient reports that he is doing better since his admission. He has developed a fever and been diagnosed with a heath care aquired pneumonia. He denies any chest pain, shortness of breath, cough, chills, or night sweats. He has lost about 20 pounds in the last 3 months unintentionally and reports he has had no appetite. He denies any bowel or bladder disturbances, and denies abdominal pain, nausea or vomiting. He reports terrible sharp pain in his middle chest along his breast bone and along the right shoulder. He  denies any back pain, or left shoulder pain. A complete review of systems is obtained and is otherwise negative.     PHYSICAL EXAM:  Wt Readings from Last 3 Encounters:  05/24/16 191 lb 12.8 oz (87 kg)  05/24/16 191 lb 8 oz (86.9 kg)  05/10/16 207 lb (93.9 kg)   Temp Readings from Last 3 Encounters:  05/27/16 98.5 F (36.9 C) (Oral)  05/24/16 97.5 F (36.4 C) (Oral)  05/10/16 98.5 F (36.9 C) (Oral)   BP Readings from Last 3 Encounters:  05/27/16 137/76  05/24/16 (!) 91/59  05/10/16 108/64   Pulse Readings from Last 3 Encounters:  05/27/16 77  05/24/16 (!) 59  05/10/16 96     Pain scale 4/10 In general this is a elderly, chronically ill caucasian male in no acute distress. He is alert and oriented x4 and appropriate throughout the examination. Cardiovascular exam reveals a regular rate and rhythm, no clicks rubs or murmurs are auscultated. Decreased breath sounds are noted in the right mid field, but the lung fields are otherwise clear bilaterally.  ECOG = 30  0 - Asymptomatic (Fully active, able to carry on all predisease activities without restriction)  1 - Symptomatic but completely ambulatory (Restricted in physically strenuous activity but ambulatory and able to carry out work of a light or sedentary nature. For example, light housework, office work)  2 - Symptomatic, <50% in bed during the day (Ambulatory and capable of all self care but unable to carry out  any work activities. Up and about more than 50% of waking hours)  3 - Symptomatic, >50% in bed, but not bedbound (Capable of only limited self-care, confined to bed or chair 50% or more of waking hours)  4 - Bedbound (Completely disabled. Cannot carry on any self-care. Totally confined to bed or chair)  5 - Death   Eustace Pen MM, Creech RH, Tormey DC, et al. [phone]). "Toxicity and response criteria of the [facility]". [place] Oncol. 5 (6): 649-55    LABORATORY DATA:  Lab Results  Component Value Date   WBC 16.1 (H) 05/27/2016   HGB 12.8 (L) 05/27/2016   HCT 38.8 (L) 05/27/2016   MCV 95.3 05/27/2016   PLT 272 05/27/2016   Lab Results  Component Value Date   NA 143 05/27/2016   K 4.3 05/27/2016   CL 98 (L) 05/27/2016   CO2 38 (H) 05/27/2016   Lab Results  Component Value Date   ALT 13 05/24/2016   AST 8 05/24/2016   ALKPHOS 118 05/24/2016   BILITOT 0.44 05/24/2016      RADIOGRAPHY: Dg Chest 2 View  Result Date: 05/27/2016 CLINICAL DATA:  Fever with shortness of breath EXAM: CHEST  2 VIEW COMPARISON:  May 10, 2016 chest radiograph and PET-CT May 03, 2016 FINDINGS: Lungs are hyperexpanded. There is airspace consolidation in the right lower lobe in the area of known mass. The mass as a discrete structure known to be present in the right lower lobe is not evident by radiography. The lungs elsewhere are clear. No pneumothorax. Heart size is upper normal with pulmonary vascularity within normal limits. No adenopathy is appreciable by radiography. No bone lesions. IMPRESSION: Right lower lobe patchy infiltrate. Question element of hemorrhage from recent biopsy. There may also be superimposed pneumonia in the right base. The known mass in the right lower lobe region is not seen as a discrete structure by radiography. Lungs elsewhere clear. Stable cardiac silhouette. Electronically Signed   By:  Lowella Grip III M.D.   On: 05/27/2016 09:47   US  Renal  Result Date: 05/24/2016 CLINICAL DATA:  Acute kidney injury.  Abnormal labs today. EXAM: RENAL / URINARY TRACT ULTRASOUND COMPLETE COMPARISON:  PET-CT 05/03/2016 FINDINGS: Right Kidney: Length: 11.8 cm. Echogenicity within normal limits. No mass or hydronephrosis visualized. Left Kidney: Length: 12.2 cm. Echogenicity is normal. No hydronephrosis. A simple cyst is identified in the lower pole region measuring 2.9 x 2.6 x 2.3 cm. Bladder: Appears normal for degree of bladder distention. Bilateral ureteral jets are visualized. IMPRESSION: 1. No hydronephrosis. 2. Simple cyst in the lower pole the left kidney. Electronically Signed   By: Nolon Nations M.D.   On: 05/24/2016 18:42   Nm Pet Image Initial (pi) Skull Base To Thigh  Result Date: 05/03/2016 CLINICAL DATA:  Initial treatment strategy for right pulmonary nodules on recent chest CT performed in the setting of chest pain. Remote history of Hodgkin's lymphoma. Former smoker. EXAM: NUCLEAR MEDICINE PET SKULL BASE TO THIGH TECHNIQUE: 11.7 mCi F-18 FDG was injected intravenously. Full-ring PET imaging was performed from the skull base to thigh after the radiotracer. CT data was obtained and used for attenuation correction and anatomic localization. FASTING BLOOD GLUCOSE:  Value: 177 mg/dl COMPARISON:  04/22/2016 chest CT. FINDINGS: NECK No hypermetabolic lymph nodes in the neck. Relatively symmetric hypermetabolism in the glottis without CT correlate, favor physiologic uptake. Mildly enlarged heterogeneous thyroid gland with no hypermetabolic thyroid nodules. CHEST Left main, left anterior descending, left circumflex and right coronary atherosclerosis. Atherosclerotic nonaneurysmal thoracic aorta. No pleural effusions. Hypermetabolic posterior right lower lobe 2.4 x 1.5 cm pulmonary nodule (series 8/image 39) with max SUV 5.6, stable in size since 04/22/2016. Right middle lobe 7 mm solid pulmonary nodule (series 8/image 47), below PET resolution, not  associated with significant metabolism. Ill-defined 2.6 x 1.2 cm right upper lobe ground-glass nodule (series 8/image 20) with associated low level metabolism (max SUV 2.3). No acute consolidative airspace disease or additional significant pulmonary nodules. Relatively symmetric mild reticulonodular opacities at the lung apices are consistent with pleural-parenchymal scarring. Mild centrilobular emphysema. Hypermetabolic right hilar adenopathy with max SUV 7.9, poorly delineated on the noncontrast CT images. No hypermetabolic axillary, mediastinal or left hilar nodes. ABDOMEN/PELVIS No abnormal hypermetabolic activity within the liver, pancreas, adrenal glands, or spleen. No hypermetabolic lymph nodes in the abdomen or pelvis. Cholecystectomy. Simple 2.7 cm renal cyst in the lateral lower left kidney. Atherosclerotic nonaneurysmal abdominal aorta. SKELETON There is faintly sclerotic hypermetabolic sternal osseous lesion with max SUV 10.7. There are several hypermetabolic osseous lesions in the thoracolumbar spine with associated faint sclerosis on the CT images, for example max SUV 9.1 in the T11 vertebral body and max SUV 9.4 in the right L3 posterior elements. There are hypermetabolic osseous lesions in the bilateral shoulder girdles and right ribs, for example max SUV 7.1 in the right second rib and max SUV 7.1 in the left superior scapula. There are hypermetabolic skeletal foci in the left anterior acetabulum (max SUV 7.9) and medial right iliac bone. Multiple pins are seen in the right femoral neck. IMPRESSION: 1. Hypermetabolic 2.4 cm posterior right lower lobe pulmonary nodule, most consistent with primary bronchogenic carcinoma. 2. Hypermetabolic right hilar lymphadenopathy, consistent with nodal metastasis. No hypermetabolic mediastinal or contralateral hilar adenopathy. 3. Multifocal hypermetabolic osseous lesions throughout the axial skeleton and bilateral shoulder girdles, most prominent in the sternum  and thoracic spine with associated faint sclerosis on the CT images, most consistent with multifocal osseous  metastatic disease. 4. Subcentimeter right middle lobe pulmonary nodule, below PET resolution, indeterminate. Ill-defined ground-glass right upper lobe 2.3 cm pulmonary nodule with low level metabolism, cannot exclude indolent adenocarcinoma. Recommend follow-up of these nodules on chest CT in 3-6 months. 5. Additional findings include aortic atherosclerosis, coronary atherosclerosis and mild emphysema. Electronically Signed   By: Ilona Sorrel M.D.   On: 05/03/2016 16:47   Ct Biopsy  Result Date: 05/10/2016 INDICATION: 82 year old with a suspicious nodule in the right lower lobe and multiple bone lesions. Findings are concerning for lung cancer. Tissue diagnosis is needed. EXAM: CT-GUIDED CORE BIOPSY OF RIGHT LOWER LOBE LUNG NODULE MEDICATIONS: None. ANESTHESIA/SEDATION: Moderate (conscious) sedation was employed during this procedure. A total of Versed 1.0 mg and Fentanyl 25 mcg was administered intravenously. Moderate Sedation Time: 15 minutes. The patient's level of consciousness and vital signs were monitored continuously by radiology nursing throughout the procedure under my direct supervision. FLUOROSCOPY TIME:  None COMPLICATIONS: None immediate. PROCEDURE: Informed written consent was obtained from the patient after a thorough discussion of the procedural risks, benefits and alternatives. All questions were addressed. A timeout was performed prior to the initiation of the procedure. Patient was placed on his right side. CT images through the chest were obtained. The nodule along the posterior right lower lobe was identified. Overlying skin was cleansed with chlorhexidine and sterile field was created. Skin was anesthetized with 1% lidocaine. A 17 gauge coaxial needle was directed into the pulmonary nodule with CT guidance. Needle position confirmed within the lesion. Two core biopsies were  obtained with an 18 gauge core device. Specimens placed in formalin. 41 gauge needle was removed with the BiosSentry tract sealant. 17 gauge needle removed without complication. Bandage placed over the puncture site. FINDINGS: 2.4 cm pleural-based nodule in the right lower lobe. Needle position confirmed within this lesion. No significant pneumothorax or hemorrhage following the core biopsies. IMPRESSION: Successful CT-guided core biopsies of the right lower lobe nodule. Electronically Signed   By: Markus Daft M.D.   On: 05/10/2016 16:23   Dg Chest Port 1 View  Result Date: 05/10/2016 CLINICAL DATA:  Status post RIGHT lung biopsy. EXAM: PORTABLE CHEST 1 VIEW COMPARISON:  Chest radiograph March 25, 2016 FINDINGS: Mildly tortuous aorta associated with hypertension. Cardiac silhouette is normal in size. Similar prominent interstitial markings and increased lung volumes most compatible with COPD. LEFT lung base atelectasis/ scarring. No pleural effusion. No pneumothorax. Soft tissue planes and included osseous structures are nonsuspicious. IMPRESSION: No pneumothorax. COPD.  LEFT lung base atelectasis/scarring. Electronically Signed   By: Elon Alas M.D.   On: 05/10/2016 14:33       IMPRESSION/PLAN: 1. Stage IV, T1b, N1, M1b, NSCLC, adenocarcinoma of the right lower lobe of the lung with metastatic disease to the sternum, clavicle, and thoracic spine. Dr. Lisbeth Renshaw has reviewed the patients films and work up to date. He recommends proceeding with radiotherapy to palliate his symptoms to the bony structures. We discussed options for palliative radiotherapy in about 10 fractions. He will come by [facility] for simulation tomorrow at 9 am and we will coordinate this with his nurses. We anticipate starting treatment early next week as an outpatient. I did discuss that Dr. Lisbeth Renshaw would discuss further if he would be treating his lung tumor as well. 2. Healthcare Acquired Pneumonia. Dr. Grandville Silos will continue to  manage this and anticipates the patient will continue to respond to IV abx and soon transition to po abx. 3. Hyperkalemia and acute kidney injury. This  will also be deferred to his primary team, but continues to improve.  The above documentation reflects my direct findings during this shared patient visit. Please see the separate note by Dr. Lisbeth Renshaw on this date for the remainder of the patient's plan of care.    Carola Rhine, PAC

## 2016-05-27 NOTE — Progress Notes (Signed)
PROGRESS NOTE    Alexander Landry  [name]:[id number] DOB: 1932/10/05 DOA: 05/24/2016 PCP: [name], MD    Brief Narrative:  [name] a 82 y.o.malewith medical history significant of hypertension, diabetes, acid reflux, kyphosis, history of Hodgkin's lymphoma is status post systemic chemotherapy about 20 years ago, recently diagnosed with metastatic bronchogenic adenocarcinoma, sent from oncology clinic for serum potassium level of 8. Patient was recently complaining of chest pain when CT scan of chest was done which was consistent with poorly nodule. Patient underwent PET scan and CT guided core biopsy of the right lower lung by IR on October 30. The final pathology result came back adenocarcinoma. Patient came to oncology clinic first time today for initiation of treatment. At oncology clinic, the lab work up was done. Potassium level was found to be 8 with creatinine of 3.2. Patient was sent to ED, was evaluated by nephrology and was transferred to [facility] for further workup.      Assessment & Plan:   Principal Problem:   Hyperkalemia Active Problems:   Acute kidney injury ([place])   HCAP (healthcare-associated pneumonia)   Diabetes mellitus type 2 with complications ([name])   Hypertension associated with diabetes ([place])   History of Hodgkin's lymphoma   Protein-calorie malnutrition, severe   Hypotension  #1 severe hyperkalemia in the setting of acute renal failure and ACE inhibitor Improved. Potassium at 4.3. EKG did show peaked T waves. Patient received medical management of albuterol, bicarbonate, insulin, dextrose, IV fluids and Kayexalate. D/C sodium bicarbonate drip. Hyperkalemia improving daily. Nephrology assess the patient during this hospitalization. Follow.  #2 acute kidney injury in the setting of severe dehydration, ACE inhibitor, NSAIDs Improving. Creatinine now at 1.48. Saline lock IV fluids. Discontinue bicarbonate drip.  No ACE inhibitor or  NSAIDs in the future.Dr Tana Coast discussed with nephrology, Dr Jonnie Finner who had recommended continuing bicarbonate drip for 1-2 days until renal function not completely resolved. Bicarbonate levels increasing and as such will discontinue bicarbonate. Follow.  #3 probable healthcare associated pneumonia Patient states has a mild cough. Patient with a MAXIMUM TEMPERATURE of 100.4 yesterday evening with a worsening leukocytosis white count at 16.1. Chest x-ray shows a right basilar infiltrate. Due to patient's recent diagnosis of metastatic lung cancer will treat as a healthcare associated pneumonia as patient is likely immunocompromised. Check blood cultures 2. Check a urine Legionella antigen. Check a urine pneumococcus antigen. Check a sputum Gram stain and culture. Check a influenza PCR. Placed empirically on IV vancomycin and IV cefepime and monitor renal function closely. Saline lock IV fluids.  #4 acute encephalopathy Likely medication induced. Patient with clinical improvement. Discontinued mittens. Marinol and tramadol were discontinued with clinical improvement. MRI of head with and without contrast, as patient with newly diagnosed metastatic lung cancer. Follow for now.  #5 moderate protein calorie malnutrition Nutritional supplementations.  #6 diabetes mellitus type 2 CBGs have ranged from 109 - 183. Continue sliding scale insulin. Oral hypoglycemic agents on hold.  #7 metastatic lung cancer Patient recently had biopsy and undergoing treatment evaluation per oncology, Dr. [name] as well as radiation oncologist. MRI of head with and without contrast as patient was scheduled to have this done tomorrow for likely staging of his metastatic lung cancer. Patient also noted to have encephalopathy during the hospitalization. Outpatient follow-up.  #8 hypotension Secondary to hypovolemia. Improved with IV fluids. Saline lock IV fluids.   #9 hypertension Continue atenolol. Resume home dose Norvasc  at 5 mg daily.     DVT  prophylaxis: SCDs Code Status: Full Family Communication: Updated patient and wife at bedside. Disposition Plan: Transfer to telemetry. Home when confusion has improved. Renal function has improved and hyperkalemia resolved. Afebrile for at least 24-48 hours with improvement with leukocytosis.    Consultants:   Nephrology: Dr.Schertz 05/24/2016  Procedures:   Renal ultrasound 05/24/2016  Chest x-ray 05/27/2016  Antimicrobials:  IV vancomycin 05/27/2016  IV cefepime 05/27/2016   Subjective: Patient sleeping however easily arousable. Following commands. Alert and oriented to self place and time. Patient states has a little bit of a cough. Patient noted to have a temperature overnight. Confusion improved.  Objective: Vitals:   05/27/16 0700 05/27/16 0800 05/27/16 0818 05/27/16 0828  BP: (!) 141/81 136/66 (!) 153/74   Pulse: 88 83 (!) 52   Resp: '17 12 19   '$ Temp:    99.4 F (37.4 C)  TempSrc:    Oral  SpO2: 94% 94% 92%   Weight:      Height:        Intake/Output Summary (Last 24 hours) at 05/27/16 1102 Last data filed at 05/27/16 0600  Gross per 24 hour  Intake              822 ml  Output              725 ml  Net               97 ml   Filed Weights   05/24/16 2338  Weight: 87 kg (191 lb 12.8 oz)    Examination:  General exam: Appears calm and comfortable. Sleeping. Respiratory system: Caorse Rhonchorus BS in right base. Respiratory effort normal. Cardiovascular system: S1 & S2 heard, Irregularly irregular. No JVD, murmurs, rubs, gallops or clicks. No pedal edema. Gastrointestinal system: Abdomen is nondistended, soft and nontender. No organomegaly or masses felt. Normal bowel sounds heard. Central nervous system: Alert and oriented. No focal neurological deficits. Extremities: Symmetric 5 x 5 power. Skin: No rashes, lesions or ulcers Psychiatry: Judgement and insight appear normal. Mood & affect appropriate.     Data Reviewed: I  have personally reviewed following labs and imaging studies  CBC:  Recent Labs Lab 05/24/16 1324 05/25/16 0247 05/26/16 0328 05/27/16 0342  WBC 10.6* 12.8* 13.3* 16.1*  NEUTROABS 6.5  --   --   --   HGB 13.6 12.4* 12.9* 12.8*  HCT 40.3 36.8* 37.8* 38.8*  MCV 95.5 92.5 93.3 95.3  PLT 320 306 264 626   Basic Metabolic Panel:  Recent Labs Lab 05/24/16 1739 05/24/16 1957 05/25/16 0622 05/26/16 0930 05/27/16 0342  NA 138 131* 136 143 143  K 6.8* 6.6* 5.2* 3.6 4.3  CL 115* 107 107 101 98*  CO2 15* 15* 19* 29 38*  GLUCOSE 63* 194* 264* 186* 185*  BUN 77* 72* 52* 38* 38*  CREATININE 2.86* 2.55* 1.73* 1.29* 1.48*  CALCIUM 9.4 9.2 9.7 9.2 9.1   GFR: Estimated Creatinine Clearance: 46.8 mL/min (by C-G formula based on SCr of 1.48 mg/dL (H)). Liver Function Tests:  Recent Labs Lab 05/24/16 1324  AST 8  ALT 13  ALKPHOS 118  BILITOT 0.44  PROT 8.2  ALBUMIN 3.2*   No results for input(s): LIPASE, AMYLASE in the last 168 hours. No results for input(s): AMMONIA in the last 168 hours. Coagulation Profile: No results for input(s): INR, PROTIME in the last 168 hours. Cardiac Enzymes: No results for input(s): CKTOTAL, CKMB, CKMBINDEX, TROPONINI in the last 168 hours. BNP (last 3 results)  No results for input(s): PROBNP in the last 8760 hours. HbA1C:  Recent Labs  05/25/16 1245  HGBA1C 6.8*   CBG:  Recent Labs Lab 05/26/16 0824 05/26/16 1202 05/26/16 1608 05/26/16 1944 05/27/16 0827  GLUCAP 166* 179* 109* 183* 179*   Lipid Profile: No results for input(s): CHOL, HDL, LDLCALC, TRIG, CHOLHDL, LDLDIRECT in the last 72 hours. Thyroid Function Tests: No results for input(s): TSH, T4TOTAL, FREET4, T3FREE, THYROIDAB in the last 72 hours. Anemia Panel: No results for input(s): VITAMINB12, FOLATE, FERRITIN, TIBC, IRON, RETICCTPCT in the last 72 hours. Sepsis Labs: No results for input(s): PROCALCITON, LATICACIDVEN in the last 168 hours.  Recent Results (from the  past 240 hour(s))  MRSA PCR Screening     Status: None   Collection Time: 05/24/16 11:24 PM  Result Value Ref Range Status   MRSA by PCR NEGATIVE NEGATIVE Final    Comment:        The GeneXpert MRSA Assay (FDA approved for NASAL specimens only), is one component of a comprehensive MRSA colonization surveillance program. It is not intended to diagnose MRSA infection nor to guide or monitor treatment for MRSA infections.          Radiology Studies: Dg Chest 2 View  Result Date: 05/27/2016 CLINICAL DATA:  Fever with shortness of breath EXAM: CHEST  2 VIEW COMPARISON:  May 10, 2016 chest radiograph and PET-CT May 03, 2016 FINDINGS: Lungs are hyperexpanded. There is airspace consolidation in the right lower lobe in the area of known mass. The mass as a discrete structure known to be present in the right lower lobe is not evident by radiography. The lungs elsewhere are clear. No pneumothorax. Heart size is upper normal with pulmonary vascularity within normal limits. No adenopathy is appreciable by radiography. No bone lesions. IMPRESSION: Right lower lobe patchy infiltrate. Question element of hemorrhage from recent biopsy. There may also be superimposed pneumonia in the right base. The known mass in the right lower lobe region is not seen as a discrete structure by radiography. Lungs elsewhere clear. Stable cardiac silhouette. Electronically Signed   By: Lowella Grip III M.D.   On: 05/27/2016 09:47        Scheduled Meds: . amLODipine  5 mg Oral Daily  . atenolol  50 mg Oral BID  . ceFEPime (MAXIPIME) IV  1 g Intravenous Q24H  . feeding supplement (ENSURE ENLIVE)  237 mL Oral BID BM  . insulin aspart  0-15 Units Subcutaneous TID WC  . insulin aspart  0-5 Units Subcutaneous QHS  . insulin aspart  3 Units Subcutaneous TID WC  . multivitamin with minerals  1 tablet Oral Daily  . pantoprazole  40 mg Oral Daily  . simvastatin  20 mg Oral q1800  . sodium chloride flush   3 mL Intravenous Q12H   Continuous Infusions: . sodium chloride 75 mL/hr at 05/27/16 0854     LOS: 3 days    Time spent: 50 mins    THOMPSON,DANIEL, MD [facility] Pager [phone]  If 7PM-7AM, please contact night-coverage [email or web address] Password [email or web address] 05/27/2016, 11:02 AM

## 2016-05-27 NOTE — Evaluation (Signed)
Occupational Therapy Evaluation Patient Details Name: Alexander Landry MRN: [id number] DOB: 1933/07/01 Today's Date: 05/27/2016    History of Present Illness 82 y.o. Male with PMH significant of hypertension, diabetes, acid reflux, kyphosis, history of Hodgkin's lymphoma is status post systemic chemotherapy about 20 years ago, recently diagnosed with metastatic bronchogenic adenocarcinoma, sent from oncology clinic for serum potassium level of 8   Clinical Impression   Pt was independent with AD prior to admission. Presents with decreased activity tolerance, impaired cognition and decreased standing balance interfering with ability to perform ADL and mobility at his baseline. HR in 70s throughout session, on 2L 02 throughout. Pt with episode of nausea at end of session, RN made aware.    Follow Up Recommendations  Home health OT;Supervision/Assistance - 24 hour    Equipment Recommendations  None recommended by OT    Recommendations for Other Services       Precautions / Restrictions Precautions Precautions: Fall Precaution Comments: watch HR      Mobility Bed Mobility               General bed mobility comments: pt in chair  Transfers Overall transfer level: Needs assistance Equipment used: Rolling walker (2 wheeled) Transfers: Sit to/from Stand Sit to Stand: Min assist         General transfer comment: min assist to rise and steady    Balance     Sitting balance-Leahy Scale: Good Sitting balance - Comments: no LOB with donning socks     Standing balance-Leahy Scale: Poor                              ADL Overall ADL's : Needs assistance/impaired Eating/Feeding: Independent;Sitting   Grooming: Wash/dry hands;Wash/dry face;Sitting;Set up   Upper Body Bathing: Minimal assitance;Sitting   Lower Body Bathing: Minimal assistance;Sit to/from stand   Upper Body Dressing : Minimal assistance;Sitting   Lower Body Dressing: Minimal  assistance;Sit to/from stand Lower Body Dressing Details (indicate cue type and reason): able to don and doff socks, assist for standing balance to complete LB dressing Toilet Transfer: Minimal assistance;Stand-pivot;RW Toilet Transfer Details (indicate cue type and reason): simulated to chair           General ADL Comments: Pt with nausea after LB dressing and standing. RN made aware.     Vision     Perception     Praxis      Pertinent Vitals/Pain Pain Assessment: Faces Faces Pain Scale: Hurts a little bit Pain Location: R shoulder Pain Descriptors / Indicators: Guarding Pain Intervention(s): Monitored during session;Repositioned     Hand Dominance Right   Extremity/Trunk Assessment Upper Extremity Assessment Upper Extremity Assessment: Overall WFL for tasks assessed (full AROM)   Lower Extremity Assessment Lower Extremity Assessment: Defer to PT evaluation       Communication Communication Communication: HOH   Cognition Arousal/Alertness: Awake/alert Behavior During Therapy: WFL for tasks assessed/performed Overall Cognitive Status: Impaired/Different from baseline Area of Impairment: Attention;Memory;Safety/judgement;Problem solving Orientation Level: Disoriented to;Place;Time;Situation Current Attention Level: Selective Memory: Decreased short-term memory   Safety/Judgement: Decreased awareness of safety;Decreased awareness of deficits   Problem Solving: Slow processing;Decreased initiation;Difficulty sequencing     General Comments       Exercises       Shoulder Instructions      Home Living Family/patient expects to be discharged to:: Private residence Living Arrangements: Spouse/significant other Available Help at Discharge: Family;Available 24 hours/day Type of Home:  House Home Access: Stairs to enter [employer] of Steps: 4 Entrance Stairs-Rails: Right Home Layout: One level     Bathroom Shower/Tub: Animal nutritionist: [place]: Environmental consultant - 2 wheels;Shower seat;Cane - single point          Prior Functioning/Environment Level of Independence: Independent with assistive device(s)        Comments: was using Rw for mobility per wife        OT Problem List: Decreased strength;Decreased activity tolerance;Impaired balance (sitting and/or standing);Decreased cognition;Decreased knowledge of use of DME or AE;Decreased safety awareness;Cardiopulmonary status limiting activity;Pain   OT Treatment/Interventions: Self-care/ADL training;Cognitive remediation/compensation;Patient/family education;Balance training;DME and/or AE instruction;Energy conservation    OT Goals(Current goals can be found in the care plan section) Acute Rehab OT Goals Patient Stated Goal: to go home OT Goal Formulation: With patient Time For Goal Achievement: 06/10/16 Potential to Achieve Goals: Good ADL Goals Pt Will Perform Grooming: with supervision;standing Pt Will Transfer to Toilet: with supervision;ambulating;regular height toilet Pt Will Perform Toileting - Clothing Manipulation and hygiene: with supervision;sit to/from stand Pt Will Perform Tub/Shower Transfer: Tub transfer;with supervision;ambulating;shower seat Additional ADL Goal #1: Pt will perform bathing and dressing with supervision for safety.  OT Frequency: Min 2X/week   Barriers to D/C:            Co-evaluation              End of Session Equipment Utilized During Treatment: Rolling walker;Gait belt  Activity Tolerance: Patient limited by fatigue Patient left: in chair;with call bell/phone within reach;with family/visitor present   Time: 1610-9604 OT Time Calculation (min): 19 min Charges:  OT General Charges $OT Visit: 1 Procedure OT Evaluation $OT Eval Moderate Complexity: 1 Procedure G-Codes:    Malka So 05/27/2016, 1:31 PM  [phone]

## 2016-05-27 NOTE — Telephone Encounter (Signed)
"  This is [employer]. Calling for Worthy Flank."  Called radiation Oncology.  Provider in clinic.  Return number [phone]."

## 2016-05-27 NOTE — Telephone Encounter (Signed)
Called [facility] spoke with patient nurse Rocky Morel RN, gave her carelink  [phone] to have her or charge nurse there arrange transportaion for the patient to be here at cancer center radiation oncology dept at [place] morning 05/28/16, for Ct simulation, he is on telemetry She stated she would call and arrange transportaion 12:54 PM

## 2016-05-27 NOTE — Progress Notes (Signed)
Pharmacy Antibiotic Note  Alexander Landry is a 82 y.o. male admitted on 05/24/2016 with pneumonia.  Pharmacy has been consulted for vancomycin dosing. Tmax is 100.4 and WBC is elevated at 16.1. Pt with kidney dysfunction and Scr is elevated at 1.48. Will need to monitor closely.  Plan: - Vancomycin '1500mg'$  IV x 1 then '750mg'$  IV Q12H - Renally adjust cefepime to 1gm IV Q24H - F/u renal fxn, C&S, clinical status and trough at [facility] - De-escalate as able  Height: '6\' 3"'$  (190.5 cm) Weight: 191 lb 12.8 oz (87 kg) IBW/kg (Calculated) : 84.5  Temp (24hrs), Avg:99.4 F (37.4 C), Min:98.6 F (37 C), Max:100.4 F (38 C)   Recent Labs Lab 05/24/16 1324 05/24/16 1739 05/24/16 1957 05/25/16 0247 05/25/16 0622 05/26/16 0328 05/26/16 0930 05/27/16 0342  WBC 10.6*  --   --  12.8*  --  13.3*  --  16.1*  CREATININE 3.2* 2.86* 2.55*  --  1.73*  --  1.29* 1.48*    Estimated Creatinine Clearance: 46.8 mL/min (by C-G formula based on SCr of 1.48 mg/dL (H)).    No Known Allergies  Antimicrobials this admission: Vanc 11/16>> Cefepime 11/16>>  Dose adjustments this admission: N/A  Microbiology results: 11/13 MRSA - NEG  Thank you for allowing pharmacy to be a part of this patient's care.  Mickenzie Stolar, Rande Lawman 05/27/2016 11:42 AM

## 2016-05-27 NOTE — Progress Notes (Signed)
Attempted report x1. 

## 2016-05-28 ENCOUNTER — Encounter: Payer: Self-pay | Admitting: *Deleted

## 2016-05-28 ENCOUNTER — Ambulatory Visit (HOSPITAL_COMMUNITY): Payer: Commercial Managed Care - HMO

## 2016-05-28 ENCOUNTER — Ambulatory Visit
Admit: 2016-05-28 | Discharge: 2016-05-28 | Disposition: A | Discharge status: Home / Self Care | Payer: Commercial Managed Care - HMO | Attending: Radiation Oncology | Admitting: Radiation Oncology

## 2016-05-28 DIAGNOSIS — C7951 Secondary malignant neoplasm of bone: Secondary | ICD-10-CM

## 2016-05-28 DIAGNOSIS — C3491 Malignant neoplasm of unspecified part of right bronchus or lung: Secondary | ICD-10-CM

## 2016-05-28 LAB — URINE MICROSCOPIC-ADD ON: RBC / HPF: NONE SEEN RBC/hpf (ref 0–5)

## 2016-05-28 LAB — URINALYSIS, ROUTINE W REFLEX MICROSCOPIC
Bilirubin Urine: NEGATIVE
Bilirubin Urine: NEGATIVE
GLUCOSE, UA: NEGATIVE mg/dL
Glucose, UA: NEGATIVE mg/dL
Hgb urine dipstick: NEGATIVE
KETONES UR: NEGATIVE mg/dL
Ketones, ur: NEGATIVE mg/dL
NITRITE: NEGATIVE
NITRITE: NEGATIVE
PH: 8.5 — AB (ref 5.0–8.0)
Protein, ur: NEGATIVE mg/dL
Protein, ur: NEGATIVE mg/dL
SPECIFIC GRAVITY, URINE: 1.016 (ref 1.005–1.030)
SPECIFIC GRAVITY, URINE: 1.016 (ref 1.005–1.030)
pH: 5.5 (ref 5.0–8.0)

## 2016-05-28 LAB — CBC
HEMATOCRIT: 36.1 % — AB (ref 39.0–52.0)
HEMOGLOBIN: 12 g/dL — AB (ref 13.0–17.0)
MCH: 31.8 pg (ref 26.0–34.0)
MCHC: 33.2 g/dL (ref 30.0–36.0)
MCV: 95.8 fL (ref 78.0–100.0)
Platelets: 242 10*3/uL (ref 150–400)
RBC: 3.77 MIL/uL — ABNORMAL LOW (ref 4.22–5.81)
RDW: 14 % (ref 11.5–15.5)
WBC: 17.1 10*3/uL — ABNORMAL HIGH (ref 4.0–10.5)

## 2016-05-28 LAB — BASIC METABOLIC PANEL
Anion gap: 14 (ref 5–15)
BUN: 37 mg/dL — ABNORMAL HIGH (ref 6–20)
CHLORIDE: 95 mmol/L — AB (ref 101–111)
CO2: 31 mmol/L (ref 22–32)
CREATININE: 1.43 mg/dL — AB (ref 0.61–1.24)
Calcium: 8.6 mg/dL — ABNORMAL LOW (ref 8.9–10.3)
GFR calc non Af Amer: 44 mL/min — ABNORMAL LOW (ref >60)
GFR, EST AFRICAN AMERICAN: 51 mL/min — AB (ref >60)
GLUCOSE: 150 mg/dL — AB (ref 65–99)
Potassium: 3.2 mmol/L — ABNORMAL LOW (ref 3.5–5.1)
Sodium: 140 mmol/L (ref 135–145)

## 2016-05-28 LAB — HIV ANTIBODY (ROUTINE TESTING W REFLEX): HIV Screen 4th Generation wRfx: NONREACTIVE

## 2016-05-28 LAB — LEGIONELLA PNEUMOPHILA SEROGP 1 UR AG: L. pneumophila Serogp 1 Ur Ag: NEGATIVE

## 2016-05-28 LAB — GLUCOSE, CAPILLARY
GLUCOSE-CAPILLARY: 166 mg/dL — AB (ref 65–99)
Glucose-Capillary: 144 mg/dL — ABNORMAL HIGH (ref 65–99)
Glucose-Capillary: 173 mg/dL — ABNORMAL HIGH (ref 65–99)
Glucose-Capillary: 179 mg/dL — ABNORMAL HIGH (ref 65–99)
Glucose-Capillary: 140 mg/dL — ABNORMAL HIGH (ref 65–99)

## 2016-05-28 NOTE — Progress Notes (Signed)
PT Cancellation Note  Patient Details Name: Alexander Landry MRN: [id number] DOB: [birth year]   Cancelled Treatment:    Reason Eval/Treat Not Completed: Fatigue/lethargy limiting ability to participate (Pt returned from cancer center and reports fatigue, will try back later in pm if time permits.  )   Divon Krabill Eli Hose 05/28/2016, 12:43 PM   Governor Rooks, PTA pager [phone]

## 2016-05-28 NOTE — Progress Notes (Signed)
   Pre-Radiation Transport Note: spoke with RN Crissie Sickles, on 11/16.17  IP nurse called:  Called on 05/27/16    Time:  12:52 IV location:  Right wrist IV fluids: No.   Last dose of pain medication given (name/time):    Rebecca Eaton, RN 05/28/2016,9:22 AM

## 2016-05-28 NOTE — Progress Notes (Signed)
   Post Radiation Note:  Report called to Inpatient RN:  Time:  Was radiation completed: CT Simulation  Were any PRN medications given: No. Name and time:  Were there any additional events: patient confused, family here to sign consent  Rebecca Eaton, RN 05/28/2016,9:30 AM

## 2016-05-28 NOTE — Progress Notes (Addendum)
PROGRESS NOTE    Alexander Landry  NGE:[id number] DOB: January 19, 1933 DOA: 05/24/2016 PCP: [name], MD    Brief Narrative:  [name] a 82 y.o.malewith medical history significant of hypertension, diabetes, acid reflux, kyphosis, history of Hodgkin's lymphoma is status post systemic chemotherapy about 20 years ago, recently diagnosed with metastatic bronchogenic adenocarcinoma, sent from oncology clinic for serum potassium level of 8. Patient was recently complaining of chest pain when CT scan of chest was done which was consistent with poorly nodule. Patient underwent PET scan and CT guided core biopsy of the right lower lung by IR on October 30. The final pathology result came back adenocarcinoma. Patient came to oncology clinic first time today for initiation of treatment. At oncology clinic, the lab work up was done. Potassium level was found to be 8 with creatinine of 3.2. Patient was sent to ED, was evaluated by nephrology and was transferred to [facility] for further workup.  Patient's hyperkalemia improved and renal function trended down. Patient noted to have a fever and leukocytosis workup revealed right lower lobe pneumonia. Patient started empirically on IV vancomycin and IV cefepime. Patient also went to [facility] for simulation in anticipation of radiation treatments.     Assessment & Plan:   Principal Problem:   Hyperkalemia Active Problems:   Acute kidney injury ([name])   HCAP (healthcare-associated pneumonia)   Diabetes mellitus type 2 with complications ([name])   Hypertension associated with diabetes ([name])   History of Hodgkin's lymphoma   Protein-calorie malnutrition, severe   Hypotension   Lung cancer ([name])  #1 severe hyperkalemia in the setting of acute renal failure and ACE inhibitor Improved. Potassium at 4.3 yesterday. Labs pending. EKG did show peaked T waves. Patient received medical management of albuterol, bicarbonate,  insulin, dextrose, IV fluids and Kayexalate. D/C'd sodium bicarbonate drip. Hyperkalemia improving daily. Nephrology assess the patient during this hospitalization. Follow.  #2 acute kidney injury in the setting of severe dehydration, ACE inhibitor, NSAIDs Improving. Creatinine now at 1.48. Labs this morning pending. Saline lock IV fluids. Discontinued bicarbonate drip.  No ACE inhibitor or NSAIDs in the future.Dr Tana Coast discussed with nephrology, Dr Jonnie Finner who had recommended continuing bicarbonate drip for 1-2 days until renal function not completely resolved. Bicarbonate levels increasing and as such discontinued bicarbonate. Follow.  #3 probable healthcare associated pneumonia Patient states has a mild cough. Patient with a MAXIMUM TEMPERATURE of 101.1 yesterday evening with a worsening leukocytosis white count at 17.1. Chest x-ray from 05/27/2016 showed a right basilar infiltrate. Due to patient's recent diagnosis of metastatic lung cancer will treat as a healthcare associated pneumonia as patient is likely immunocompromised. Blood cultures pending. Urine strep pneumococcus antigen negative. Influenza A PCR negative. HIV nonreactive. Sputum Gram stain and culture pending. Continue empirically on IV vancomycin and IV cefepime and monitor renal function closely. Saline lock IV fluids.  #4 acute encephalopathy Likely medication induced. Patient with clinical improvement. Discontinued mittens. Marinol and tramadol were discontinued with clinical improvement. MRI of head with and without contrast, with no abnormalities to suggest intracranial metastatic disease. No acute infarct noted either.  #5 moderate protein calorie malnutrition Nutritional supplementations.  #6 diabetes mellitus type 2 CBGs have ranged from 144 - 157. Continue sliding scale insulin. Oral hypoglycemic agents on hold.  #7 metastatic lung cancer Patient recently had biopsy and undergoing treatment evaluation per oncology, Dr.  [name] as well as radiation oncologist. MRI of head with and without contrast was done 05/27/2016 with no gross  evidence of abnormal enhancement. No signal abnormality to suggest intracranial metastatic disease. Moderate brain parenchymal volume loss, mild chronic microvascular ischemic changes, chronic left anterior corona radiator small lacunar infarct.  Patient being transferred to [facility], this morning for simulation for radiation treatment. Outpatient follow-up.  #8 hypotension Secondary to hypovolemia. Improved with IV fluids. Saline lock IV fluids.   #9 hypertension Continue atenolol and Norvasc.  #10 fevers Likely secondary to pneumonia. Urinalysis pending. Blood cultures pending. Continue empiric IV vancomycin and IV cefepime.     DVT prophylaxis: SCDs Code Status: Full Family Communication: Updated patient and wife at bedside. Disposition Plan: Home when confusion has improved. Renal function has improved and hyperkalemia resolved. Afebrile for at least 24-48 hours with improvement with leukocytosis.    Consultants:   Nephrology: Dr.Schertz 05/24/2016  Procedures:   Renal ultrasound 05/24/2016  Chest x-ray 05/27/2016  MRI HEAD 05/27/2016  Antimicrobials:  IV vancomycin 05/27/2016  IV cefepime 05/27/2016   Subjective: Patient alert. No SOB. No CP. Following commands. Patient states has a little bit of a cough. Patient noted to have a temperature overnight. Confusion improved.Patient with Tmax 101.1 last night.  Objective: Vitals:   05/28/16 0000 05/28/16 0100 05/28/16 0635 05/28/16 0824  BP:   (!) 126/51   Pulse:   68   Resp:   16   Temp: 99.4 F (37.4 C)  99.6 F (37.6 C)   TempSrc:   Oral   SpO2:  94% 93% 94%  Weight:   92.2 kg (203 lb 3.2 oz)   Height:        Intake/Output Summary (Last 24 hours) at 05/28/16 0837 Last data filed at 05/28/16 0306  Gross per 24 hour  Intake              773 ml  Output              500 ml  Net               273 ml   Filed Weights   05/24/16 2338 05/28/16 0635  Weight: 87 kg (191 lb 12.8 oz) 92.2 kg (203 lb 3.2 oz)    Examination:  General exam: Appears calm and comfortable. Sleeping. Respiratory system: Caorse Rhonchorus BS in right base. Respiratory effort normal. Cardiovascular system: S1 & S2 heard, Irregularly irregular. No JVD, murmurs, rubs, gallops or clicks. No pedal edema. Gastrointestinal system: Abdomen is nondistended, soft and nontender. No organomegaly or masses felt. Normal bowel sounds heard. Central nervous system: Alert and oriented. No focal neurological deficits. Extremities: Symmetric 5 x 5 power. Skin: No rashes, lesions or ulcers Psychiatry: Judgement and insight appear normal. Mood & affect appropriate.     Data Reviewed: I have personally reviewed following labs and imaging studies  CBC:  Recent Labs Lab 05/24/16 1324 05/25/16 0247 05/26/16 0328 05/27/16 0342 05/28/16 0715  WBC 10.6* 12.8* 13.3* 16.1* 17.1*  NEUTROABS 6.5  --   --   --   --   HGB 13.6 12.4* 12.9* 12.8* 12.0*  HCT 40.3 36.8* 37.8* 38.8* 36.1*  MCV 95.5 92.5 93.3 95.3 95.8  PLT 320 306 264 272 093   Basic Metabolic Panel:  Recent Labs Lab 05/24/16 1739 05/24/16 1957 05/25/16 0622 05/26/16 0930 05/27/16 0342  NA 138 131* 136 143 143  K 6.8* 6.6* 5.2* 3.6 4.3  CL 115* 107 107 101 98*  CO2 15* 15* 19* 29 38*  GLUCOSE 63* 194* 264* 186* 185*  BUN 77* 72* 52* 38*  38*  CREATININE 2.86* 2.55* 1.73* 1.29* 1.48*  CALCIUM 9.4 9.2 9.7 9.2 9.1   GFR: Estimated Creatinine Clearance: 46.8 mL/min (by C-G formula based on SCr of 1.48 mg/dL (H)). Liver Function Tests:  Recent Labs Lab 05/24/16 1324  AST 8  ALT 13  ALKPHOS 118  BILITOT 0.44  PROT 8.2  ALBUMIN 3.2*   No results for input(s): LIPASE, AMYLASE in the last 168 hours. No results for input(s): AMMONIA in the last 168 hours. Coagulation Profile: No results for input(s): INR, PROTIME in the last 168  hours. Cardiac Enzymes: No results for input(s): CKTOTAL, CKMB, CKMBINDEX, TROPONINI in the last 168 hours. BNP (last 3 results) No results for input(s): PROBNP in the last 8760 hours. HbA1C:  Recent Labs  05/25/16 1245  HGBA1C 6.8*   CBG:  Recent Labs Lab 05/27/16 1150 05/27/16 1615 05/27/16 1728 05/27/16 2146 05/28/16 0820  GLUCAP 216* 118* 144* 157* 144*   Lipid Profile: No results for input(s): CHOL, HDL, LDLCALC, TRIG, CHOLHDL, LDLDIRECT in the last 72 hours. Thyroid Function Tests: No results for input(s): TSH, T4TOTAL, FREET4, T3FREE, THYROIDAB in the last 72 hours. Anemia Panel: No results for input(s): VITAMINB12, FOLATE, FERRITIN, TIBC, IRON, RETICCTPCT in the last 72 hours. Sepsis Labs: No results for input(s): PROCALCITON, LATICACIDVEN in the last 168 hours.  Recent Results (from the past 240 hour(s))  MRSA PCR Screening     Status: None   Collection Time: 05/24/16 11:24 PM  Result Value Ref Range Status   MRSA by PCR NEGATIVE NEGATIVE Final    Comment:        The GeneXpert MRSA Assay (FDA approved for NASAL specimens only), is one component of a comprehensive MRSA colonization surveillance program. It is not intended to diagnose MRSA infection nor to guide or monitor treatment for MRSA infections.          Radiology Studies: Dg Chest 2 View  Result Date: 05/27/2016 CLINICAL DATA:  Fever with shortness of breath EXAM: CHEST  2 VIEW COMPARISON:  May 10, 2016 chest radiograph and PET-CT May 03, 2016 FINDINGS: Lungs are hyperexpanded. There is airspace consolidation in the right lower lobe in the area of known mass. The mass as a discrete structure known to be present in the right lower lobe is not evident by radiography. The lungs elsewhere are clear. No pneumothorax. Heart size is upper normal with pulmonary vascularity within normal limits. No adenopathy is appreciable by radiography. No bone lesions. IMPRESSION: Right lower lobe patchy  infiltrate. Question element of hemorrhage from recent biopsy. There may also be superimposed pneumonia in the right base. The known mass in the right lower lobe region is not seen as a discrete structure by radiography. Lungs elsewhere clear. Stable cardiac silhouette. Electronically Signed   By: Lowella Grip III M.D.   On: 05/27/2016 09:47   Mr Jeri Cos VQ Contrast  Result Date: 05/27/2016 CLINICAL DATA:  82 y/o  M; metastatic lung cancer. EXAM: MRI HEAD WITHOUT AND WITH CONTRAST TECHNIQUE: Multiplanar, multiecho pulse sequences of the brain and surrounding structures were obtained without and with intravenous contrast. CONTRAST:  47m MULTIHANCE GADOBENATE DIMEGLUMINE 529 MG/ML IV SOLN COMPARISON:  None. FINDINGS: Brain: No diffusion signal abnormality. Moderate brain parenchymal volume loss. Mild chronic microvascular ischemic changes in periventricular white matter and the pons. Left anterior corona radiata small no abnormal susceptibility hypointensity to indicate intracranial hemorrhage. Severe motion artifact of T1 postcontrast sequences. No gross evidence for abnormal enhancement. Chronic infarct. Vascular: Normal flow voids.  Skull and upper cervical spine: Normal marrow signal. Left temporal scalp 10 mm cyst is probably sebaceous. Sinuses/Orbits: Negative.  Bilateral intra-ocular lens replacement. Other: None. IMPRESSION: 1. Extensive motion artifact of T1 postcontrast sequences. No gross evidence for abnormal enhancement. No signal abnormality on other sequences to suggest intracranial metastatic disease. 2. Moderate brain parenchymal volume loss, mild chronic microvascular ischemic changes, and chronic left anterior corona radiata small lacunar infarct. Electronically Signed   By: Kristine Garbe M.D.   On: 05/27/2016 20:49        Scheduled Meds: . amLODipine  5 mg Oral Daily  . atenolol  50 mg Oral BID  . ceFEPime (MAXIPIME) IV  2 g Intravenous Q24H  . feeding supplement  (ENSURE ENLIVE)  237 mL Oral BID BM  . guaiFENesin  1,200 mg Oral BID  . insulin aspart  0-15 Units Subcutaneous TID WC  . insulin aspart  0-5 Units Subcutaneous QHS  . insulin aspart  3 Units Subcutaneous TID WC  . ipratropium-albuterol  3 mL Nebulization Q6H  . multivitamin with minerals  1 tablet Oral Daily  . pantoprazole  40 mg Oral Daily  . simvastatin  20 mg Oral q1800  . sodium chloride flush  3 mL Intravenous Q12H  . vancomycin  750 mg Intravenous Q12H   Continuous Infusions:    LOS: 4 days    Time spent: 93 mins    Zaahir Pickney, MD [facility] Pager [phone]  If 7PM-7AM, please contact night-coverage [email or web address] Password [facility] 05/28/2016, 8:37 AM

## 2016-05-28 NOTE — Care Management Important Message (Signed)
Important Message  Patient Details  Name: JACION DISMORE MRN: [id number] Date of Birth: [birth year]   Medicare Important Message Given:  Yes    Daneya Hartgrove Abena 05/28/2016, 10:06 AM

## 2016-05-28 NOTE — Progress Notes (Signed)
Called [facility] [phone] spoke with dispatcher Hoyle Sauer, asking about transportation to take patient back  To [address], "she stated they are on route should be there shortly", calle the floor and spoke with RN Lonn Georgia, gave updated status, family in with patient, offered fluids, declined, gave another warm blanket for the patient 10:47 AM

## 2016-05-28 NOTE — Progress Notes (Signed)
Nutrition Follow-up  DOCUMENTATION CODES:   Severe malnutrition in context of chronic illness  INTERVENTION:   - Continue Ensure Enlive oral nutrition supplement BID. Each provides 350 kcal and 20 grams protein. - Encourage PO intake.  NUTRITION DIAGNOSIS:   Malnutrition related to catabolic illness as evidenced by percent weight loss, energy intake < or equal to 50% for > or equal to 1 month.  Ongoing.  GOAL:   Patient will meet greater than or equal to 90% of their needs  Progressing.  MONITOR:   PO intake, Supplement acceptance, Labs, Weight trends, I & O's  ASSESSMENT:   82 y.o. Male with PMH significant of hypertension, diabetes, acid reflux, kyphosis, history of Hodgkin's lymphoma is status post systemic chemotherapy about 20 years ago, recently diagnosed with metastatic bronchogenic adenocarcinoma, sent from oncology clinic for serum potassium level of 8  Spoke with RN in hallway who reports pt and family are requesting an oral nutrition supplement. Per previous RD note and per order history, pt should currently be receiving Ensure Enlive BID. Pt's wife states that he has not received Ensure Enlive recently and that he prefers the Soil scientist. Pt's wife expresses concern that pt is not getting enough to eat. Will make sure that order for Ensure Enlive BID is active.  Spoke with RN regarding situation and updates.  Medications reviewed and include sliding scale Novolog, MVI with minerals, 40 g Protonix daily, PRN Zofran, PRN Miralax  Labs reviewed and include low potassium (3.2 mmol/L), elevated BUN (37 mg/dL), elevated creatinine (1.43 mg/dL), low calcium (8.6 mg/dL) CBG's: 118, 144, 157, 173 mg/dL  Diet Order:  Diet renal/carb modified with fluid restriction Diet-HS Snack? Nothing; Room service appropriate? Yes; Fluid consistency: Thin  Skin:  Reviewed, no issues  Last BM:  05/26/16  Height:   Ht Readings from Last 1 Encounters:  05/24/16 '6\' 3"'$  (1.905 m)     Weight:   Wt Readings from Last 1 Encounters:  05/28/16 203 lb 3.2 oz (92.2 kg)    Ideal Body Weight:  89 kg  BMI:  Body mass index is 25.4 kg/m.  Estimated Nutritional Needs:   Kcal:  1650-1850  Protein:  75-85 gm  Fluid:  1.6-1.8 L  EDUCATION NEEDS:   No education needs identified at this time  Jeb Levering Dietetic Intern Pager Number: [phone]

## 2016-05-29 DIAGNOSIS — R0902 Hypoxemia: Secondary | ICD-10-CM

## 2016-05-29 DIAGNOSIS — N390 Urinary tract infection, site not specified: Secondary | ICD-10-CM

## 2016-05-29 DIAGNOSIS — R509 Fever, unspecified: Secondary | ICD-10-CM

## 2016-05-29 DIAGNOSIS — C3431 Malignant neoplasm of lower lobe, right bronchus or lung: Secondary | ICD-10-CM

## 2016-05-29 DIAGNOSIS — Z87891 Personal history of nicotine dependence: Secondary | ICD-10-CM

## 2016-05-29 DIAGNOSIS — Z8051 Family history of malignant neoplasm of kidney: Secondary | ICD-10-CM

## 2016-05-29 DIAGNOSIS — B9620 Unspecified Escherichia coli [E. coli] as the cause of diseases classified elsewhere: Secondary | ICD-10-CM | POA: Clinically undetermined

## 2016-05-29 DIAGNOSIS — R2231 Localized swelling, mass and lump, right upper limb: Secondary | ICD-10-CM

## 2016-05-29 LAB — CBC WITH DIFFERENTIAL/PLATELET
BASOS PCT: 0 %
Basophils Absolute: 0 10*3/uL (ref 0.0–0.1)
EOS ABS: 0.4 10*3/uL (ref 0.0–0.7)
EOS PCT: 3 %
HCT: 36.6 % — ABNORMAL LOW (ref 39.0–52.0)
HEMOGLOBIN: 12.3 g/dL — AB (ref 13.0–17.0)
LYMPHS ABS: 2.7 10*3/uL (ref 0.7–4.0)
Lymphocytes Relative: 16 %
MCH: 32 pg (ref 26.0–34.0)
MCHC: 33.6 g/dL (ref 30.0–36.0)
MCV: 95.3 fL (ref 78.0–100.0)
MONO ABS: 2.1 10*3/uL — AB (ref 0.1–1.0)
MONOS PCT: 13 %
NEUTROS PCT: 68 %
Neutro Abs: 11.5 10*3/uL — ABNORMAL HIGH (ref 1.7–7.7)
PLATELETS: 241 10*3/uL (ref 150–400)
RBC: 3.84 MIL/uL — ABNORMAL LOW (ref 4.22–5.81)
RDW: 13.7 % (ref 11.5–15.5)
WBC: 16.8 10*3/uL — ABNORMAL HIGH (ref 4.0–10.5)

## 2016-05-29 LAB — URINE CULTURE: Culture: NO GROWTH

## 2016-05-29 LAB — BASIC METABOLIC PANEL
Anion gap: 13 (ref 5–15)
BUN: 41 mg/dL — AB (ref 6–20)
CALCIUM: 8.4 mg/dL — AB (ref 8.9–10.3)
CHLORIDE: 93 mmol/L — AB (ref 101–111)
CO2: 31 mmol/L (ref 22–32)
CREATININE: 1.36 mg/dL — AB (ref 0.61–1.24)
GFR calc non Af Amer: 47 mL/min — ABNORMAL LOW (ref >60)
GFR, EST AFRICAN AMERICAN: 55 mL/min — AB (ref >60)
Glucose, Bld: 155 mg/dL — ABNORMAL HIGH (ref 65–99)
Potassium: 3.4 mmol/L — ABNORMAL LOW (ref 3.5–5.1)
SODIUM: 137 mmol/L (ref 135–145)

## 2016-05-29 LAB — GLUCOSE, CAPILLARY
GLUCOSE-CAPILLARY: 158 mg/dL — AB (ref 65–99)
GLUCOSE-CAPILLARY: 148 mg/dL — AB (ref 65–99)
GLUCOSE-CAPILLARY: 151 mg/dL — AB (ref 65–99)
Glucose-Capillary: 178 mg/dL — ABNORMAL HIGH (ref 65–99)
Glucose-Capillary: 177 mg/dL — ABNORMAL HIGH (ref 65–99)

## 2016-05-29 MED ORDER — IPRATROPIUM-ALBUTEROL 0.5-2.5 (3) MG/3ML IN SOLN: 3.0000 mL | Freq: Four times a day (QID) | RESPIRATORY_TRACT | Status: DC | PRN

## 2016-05-29 MED ORDER — NAPROXEN 250 MG PO TABS: 375.0000 mg | ORAL_TABLET | Freq: Two times a day (BID) | ORAL | Status: DC

## 2016-05-29 MED ORDER — POTASSIUM CHLORIDE CRYS ER 20 MEQ PO TBCR
40.0000 meq | EXTENDED_RELEASE_TABLET | Freq: Once | ORAL | Status: AC
  Administered 2016-05-29: 40 meq via ORAL
  Filled 2016-05-29: qty 2

## 2016-05-29 MED ORDER — MIRTAZAPINE 15 MG PO TABS
15.0000 mg | ORAL_TABLET | Freq: Every day | ORAL | Status: DC
  Administered 2016-05-29 – 2016-05-30 (×2): 15 mg via ORAL
  Filled 2016-05-29 (×2): qty 1

## 2016-05-29 NOTE — Consult Note (Addendum)
[place] for Infectious Disease       Reason for Consult: fever    Referring Physician: Dr. Grandville Silos  Principal Problem:   Hyperkalemia Active Problems:   Diabetes mellitus type 2 with complications ([place])   Hypertension associated with diabetes ([place])   History of Hodgkin's lymphoma   Acute kidney injury ([place])   Protein-calorie malnutrition, severe   Hypotension   HCAP (healthcare-associated pneumonia)   Lung cancer ([place])   E-coli UTI   . amLODipine  5 mg Oral Daily  . atenolol  50 mg Oral BID  . feeding supplement (ENSURE ENLIVE)  237 mL Oral BID BM  . guaiFENesin  1,200 mg Oral BID  . insulin aspart  0-15 Units Subcutaneous TID WC  . insulin aspart  0-5 Units Subcutaneous QHS  . insulin aspart  3 Units Subcutaneous TID WC  . ipratropium-albuterol  3 mL Nebulization Q6H  . mirtazapine  15 mg Oral QHS  . multivitamin with minerals  1 tablet Oral Daily  . pantoprazole  40 mg Oral Daily  . simvastatin  20 mg Oral q1800  . sodium chloride flush  3 mL Intravenous Q12H    Recommendations: Stop antibiotics Consider incentive spirometry  May help to get him up, ambulate to mobilize secretions Consider naprosyn suppression if felt to be safe with current kidney function  Assessment: He has fever with no obvious source.  He is hypoxic but otherwise no particular signs of pneumonia with no increased sob, no increased cough or congestion; no other signs including no dysuria, no worsening diarrhea, no other localizing symptoms.  I less suspect this is active infection and more suspect this is tumor fever, medications or atelectasis/pneumonitis from not being able to ambulate, sit up.     Antibiotics: Vancomycin and cefepime  HPI: Alexander Landry is a 82 y.o. male with recent diagnosis of uncurable lung cancer sent from oncology clinic for hyperkalemia. Over the last month he was feeling weak and tired, loosing weight and treated for his hyperkalemia.  He developed a  fever on 11/15 to 100.4, then 101 on 11/16 and 100.5 on 11/17.  He had been placed on empiric vancomycin and cefepime but continues to have fever.  As above, no localizing symptoms.  Was not having fever prior to admission that was known.  History obtained from son and wife as well as patient. CXR independently reviewed and area of mass on right noted.    Review of Systems:  Constitutional: negative for chills Respiratory: negative for cough, sputum or hemoptysis Gastrointestinal: negative for diarrhea Genitourinary: negative for frequency and dysuria Integument/breast: negative for rash Musculoskeletal: negative for myalgias and arthralgias All other systems reviewed and are negative    Past Medical History:  Diagnosis Date  . Adenocarcinoma of right lung, stage 4 ([place]) 05/24/2016  . Diabetes mellitus without complication ([place])   . GERD (gastroesophageal reflux disease)   . Hypertension   . Kyphosis (acquired) ([place])   . Lung cancer ([place]) 05/10/2016   right lower lobe lung=adenocarcinoma  . Medical history non-contributory     Social History  Substance Use Topics  . Smoking status: Former Smoker    Packs/day: 1.00    Years: 40.00    Types: Cigarettes    Quit date: 08/24/1985  . Smokeless tobacco: Never Used  . Alcohol use No    Family History  Problem Relation Age of Onset  . Kidney cancer Mother     No Known Allergies  Physical Exam: Constitutional:  in no apparent distress  Vitals:   05/29/16 0315 05/29/16 0519  BP:  127/64  Pulse:  68  Resp:  20  Temp: 99.2 F (37.3 C) 98.4 F (36.9 C)   EYES: anicteric ENMT: no thrush Cardiovascular: Cor RRR Respiratory: diffuse rhonchi; normal respiratory effort GI: Bowel sounds are normal, liver is not enlarged, spleen is not enlarged Musculoskeletal: no pedal edema noted Skin: negatives: no rash Hematologic: no cervical lad  Lab Results  Component Value Date   WBC 16.8 (H) 05/29/2016   HGB 12.3 (L) 05/29/2016    HCT 36.6 (L) 05/29/2016   MCV 95.3 05/29/2016   PLT 241 05/29/2016    Lab Results  Component Value Date   CREATININE 1.36 (H) 05/29/2016   BUN 41 (H) 05/29/2016   NA 137 05/29/2016   K 3.4 (L) 05/29/2016   CL 93 (L) 05/29/2016   CO2 31 05/29/2016    Lab Results  Component Value Date   ALT 13 05/24/2016   AST 8 05/24/2016   ALKPHOS 118 05/24/2016     Microbiology: Recent Results (from the past 240 hour(s))  MRSA PCR Screening     Status: None   Collection Time: 05/24/16 11:24 PM  Result Value Ref Range Status   MRSA by PCR NEGATIVE NEGATIVE Final    Comment:        The GeneXpert MRSA Assay (FDA approved for NASAL specimens only), is one component of a comprehensive MRSA colonization surveillance program. It is not intended to diagnose MRSA infection nor to guide or monitor treatment for MRSA infections.   Culture, blood (routine x 2) Call MD if unable to obtain prior to antibiotics being given     Status: None (Preliminary result)   Collection Time: 05/27/16 11:23 AM  Result Value Ref Range Status   Specimen Description BLOOD LEFT ARM  Final   Special Requests IN PEDIATRIC BOTTLE 2CC  Final   Culture NO GROWTH 2 DAYS  Final   Report Status PENDING  Incomplete  Culture, blood (routine x 2) Call MD if unable to obtain prior to antibiotics being given     Status: None (Preliminary result)   Collection Time: 05/27/16 11:28 AM  Result Value Ref Range Status   Specimen Description BLOOD LEFT HAND  Final   Special Requests IN PEDIATRIC BOTTLE 3CC  Final   Culture NO GROWTH 2 DAYS  Final   Report Status PENDING  Incomplete  Urine culture     Status: Abnormal   Collection Time: 05/27/16  1:24 PM  Result Value Ref Range Status   Specimen Description URINE, CLEAN CATCH  Final   Special Requests NONE  Final   Culture >=100,000 COLONIES/mL ESCHERICHIA COLI (A)  Final   Report Status 05/29/2016 FINAL  Final   Organism ID, Bacteria ESCHERICHIA COLI (A)  Final       Susceptibility   Escherichia coli - MIC*    AMPICILLIN <=2 SENSITIVE Sensitive     CEFAZOLIN <=4 SENSITIVE Sensitive     CEFTRIAXONE <=1 SENSITIVE Sensitive     CIPROFLOXACIN <=0.25 SENSITIVE Sensitive     GENTAMICIN <=1 SENSITIVE Sensitive     IMIPENEM <=0.25 SENSITIVE Sensitive     NITROFURANTOIN <=16 SENSITIVE Sensitive     TRIMETH/SULFA <=20 SENSITIVE Sensitive     AMPICILLIN/SULBACTAM <=2 SENSITIVE Sensitive     PIP/TAZO <=4 SENSITIVE Sensitive     Extended ESBL NEGATIVE Sensitive     * >=100,000 COLONIES/mL ESCHERICHIA COLI  Urine culture  Status: None   Collection Time: 05/28/16  2:12 PM  Result Value Ref Range Status   Specimen Description URINE, RANDOM  Final   Special Requests NONE  Final   Culture NO GROWTH  Final   Report Status 05/29/2016 FINAL  Final    Scharlene Gloss, [place] for Infectious Disease [facility] [email or web address] [phone] pager  [phone] cell 05/29/2016, 1:27 PM

## 2016-05-29 NOTE — Progress Notes (Signed)
PROGRESS NOTE    Alexander Landry  GGY:[id number] DOB: 10/03/1932 DOA: 05/24/2016 PCP: [name], MD    Brief Narrative:  Alexander Him Flournoyis a 82 y.o.malewith medical history significant of hypertension, diabetes, acid reflux, kyphosis, history of Hodgkin's lymphoma is status post systemic chemotherapy about 20 years ago, recently diagnosed with metastatic bronchogenic adenocarcinoma, sent from oncology clinic for serum potassium level of 8. Patient was recently complaining of chest pain when CT scan of chest was done which was consistent with poorly nodule. Patient underwent PET scan and CT guided core biopsy of the right lower lung by IR on October 30. The final pathology result came back adenocarcinoma. Patient came to oncology clinic first time today for initiation of treatment. At oncology clinic, the lab work up was done. Potassium level was found to be 8 with creatinine of 3.2. Patient was sent to ED, was evaluated by nephrology and was transferred to [facility] for further workup.  Patient's hyperkalemia improved and renal function trended down. Patient noted to have a fever and leukocytosis workup revealed right lower lobe pneumonia. Patient started empirically on IV vancomycin and IV cefepime. Patient also went to [facility] long hospital for simulation in anticipation of radiation treatments.     Assessment & Plan:   Principal Problem:   Hyperkalemia Active Problems:   Acute kidney injury ([place])   HCAP (healthcare-associated pneumonia)   Diabetes mellitus type 2 with complications ([place])   Hypertension associated with diabetes ([place])   History of Hodgkin's lymphoma   Protein-calorie malnutrition, severe   Hypotension   Lung cancer ([place])   E-coli UTI  #1 severe hyperkalemia in the setting of acute renal failure and ACE inhibitor Improved. Potassium at 4.3 yesterday. Labs pending. EKG did show peaked T waves. Patient received medical management of albuterol,  bicarbonate, insulin, dextrose, IV fluids and Kayexalate. D/C'd sodium bicarbonate drip. Hyperkalemia improving daily. Nephrology assess the patient during this hospitalization. Follow.  #2 acute kidney injury in the setting of severe dehydration, ACE inhibitor, NSAIDs Improving. Creatinine now at 1.48. Labs this morning pending. Saline lock IV fluids. Discontinued bicarbonate drip.  No ACE inhibitor or NSAIDs in the future.Dr Tana Coast discussed with nephrology, Dr Jonnie Finner who had recommended continuing bicarbonate drip for 1-2 days until renal function not completely resolved. Bicarbonate levels increasing and as such discontinued bicarbonate. Follow.  #3 probable healthcare associated pneumonia Patient states has a mild cough. Patient with a MAXIMUM TEMPERATURE of 100.5 yesterday evening with a fluctuating leukocytosis white count at 16.8. Chest x-ray from 05/27/2016 showed a right basilar infiltrate. Due to patient's recent diagnosis of metastatic lung cancer will treat as a healthcare associated pneumonia as patient is likely immunocompromised. Blood cultures pending. Urine strep pneumococcus antigen negative. Influenza A PCR negative. HIV nonreactive. Sputum Gram stain and culture pending. Continue empirically on IV vancomycin and IV cefepime and monitor renal function closely. Saline lock IV fluids.  #4 acute encephalopathy Likely medication induced. Patient with clinical improvement. Discontinued mittens. Marinol and tramadol were discontinued with clinical improvement. MRI of head with and without contrast, with no abnormalities to suggest intracranial metastatic disease. No acute infarct noted either.  #5 moderate protein calorie malnutrition Nutritional supplementations.  #6 diabetes mellitus type 2 CBGs have ranged from 148 - 178. Continue sliding scale insulin. Oral hypoglycemic agents on hold.  #7 metastatic lung cancer Patient recently had biopsy and undergoing treatment evaluation per  oncology, Dr. [name] as well as radiation oncologist. MRI of head with and without contrast was done  05/27/2016 with no gross evidence of abnormal enhancement. No signal abnormality to suggest intracranial metastatic disease. Moderate brain parenchymal volume loss, mild chronic microvascular ischemic changes, chronic left anterior corona radiator small lacunar infarct.  Patient underwent simulation for radiation treatment at [facility] yesterday. Outpatient follow-up.  #8 hypotension Secondary to hypovolemia. Improved with IV fluids. Saline lock IV fluids.   #9 hypertension Continue atenolol and Norvasc.  #10 Escherichia coli UTI Continue IV cefepime.  #11 fevers ?? Etiology. Likely secondary to pneumonia and E Coli uti. Blood cultures pending. Continue empiric IV vancomycin and IV cefepime. Patient with fevers over the past 3 days will consult with ID for further evaluation and management.     DVT prophylaxis: SCDs Code Status: Full Family Communication: Updated patient and wife at bedside. Disposition Plan: Home when confusion has improved. Renal function has improved and hyperkalemia resolved. Afebrile for at least 24-48 hours with improvement with leukocytosis.    Consultants:   Nephrology: Dr.Schertz 05/24/2016  Procedures:   Renal ultrasound 05/24/2016  Chest x-ray 05/27/2016  MRI HEAD 05/27/2016  Antimicrobials:  IV vancomycin 05/27/2016  IV cefepime 05/27/2016   Subjective: Patient alert. No SOB. No CP. Following commands. Patient states was in pain overnight and this morning. Patient with fever overnight and over past few days.   Objective: Vitals:   05/29/16 0138 05/29/16 0315 05/29/16 0519 05/29/16 0745  BP:   127/64   Pulse:   68   Resp:   20   Temp:  99.2 F (37.3 C) 98.4 F (36.9 C)   TempSrc:  Oral Oral   SpO2: 93%  94% 95%  Weight:   90.9 kg (200 lb 6.4 oz)   Height:        Intake/Output Summary (Last 24 hours) at 05/29/16  1339 Last data filed at 05/29/16 0651  Gross per 24 hour  Intake              530 ml  Output             1000 ml  Net             -470 ml   Filed Weights   05/24/16 2338 05/28/16 0635 05/29/16 0519  Weight: 87 kg (191 lb 12.8 oz) 92.2 kg (203 lb 3.2 oz) 90.9 kg (200 lb 6.4 oz)    Examination:  General exam: Appears calm and comfortable. Sleeping. Respiratory system: Less Caorse Rhonchorus BS in right base. Respiratory effort normal. Cardiovascular system: S1 & S2 heard, Irregularly irregular. No JVD, murmurs, rubs, gallops or clicks. No pedal edema. Gastrointestinal system: Abdomen is nondistended, soft and nontender. No organomegaly or masses felt. Normal bowel sounds heard. Central nervous system: Alert and oriented. No focal neurological deficits. Extremities: Symmetric 5 x 5 power. Skin: No rashes, lesions or ulcers Psychiatry: Judgement and insight appear normal. Mood & affect appropriate.     Data Reviewed: I have personally reviewed following labs and imaging studies  CBC:  Recent Labs Lab 05/24/16 1324 05/25/16 0247 05/26/16 0328 05/27/16 0342 05/28/16 0715 05/29/16 0538  WBC 10.6* 12.8* 13.3* 16.1* 17.1* 16.8*  NEUTROABS 6.5  --   --   --   --  11.5*  HGB 13.6 12.4* 12.9* 12.8* 12.0* 12.3*  HCT 40.3 36.8* 37.8* 38.8* 36.1* 36.6*  MCV 95.5 92.5 93.3 95.3 95.8 95.3  PLT 320 306 264 272 242 628   Basic Metabolic Panel:  Recent Labs Lab 05/25/16 0622 05/26/16 0930 05/27/16 0342 05/28/16 0850 05/29/16 3151  NA 136 143 143 140 137  K 5.2* 3.6 4.3 3.2* 3.4*  CL 107 101 98* 95* 93*  CO2 19* 29 38* 31 31  GLUCOSE 264* 186* 185* 150* 155*  BUN 52* 38* 38* 37* 41*  CREATININE 1.73* 1.29* 1.48* 1.43* 1.36*  CALCIUM 9.7 9.2 9.1 8.6* 8.4*   GFR: Estimated Creatinine Clearance: 50.9 mL/min (by C-G formula based on SCr of 1.36 mg/dL (H)). Liver Function Tests:  Recent Labs Lab 05/24/16 1324  AST 8  ALT 13  ALKPHOS 118  BILITOT 0.44  PROT 8.2  ALBUMIN  3.2*   No results for input(s): LIPASE, AMYLASE in the last 168 hours. No results for input(s): AMMONIA in the last 168 hours. Coagulation Profile: No results for input(s): INR, PROTIME in the last 168 hours. Cardiac Enzymes: No results for input(s): CKTOTAL, CKMB, CKMBINDEX, TROPONINI in the last 168 hours. BNP (last 3 results) No results for input(s): PROBNP in the last 8760 hours. HbA1C: No results for input(s): HGBA1C in the last 72 hours. CBG:  Recent Labs Lab 05/28/16 2030 05/28/16 2107 05/29/16 0647 05/29/16 0821 05/29/16 1205  GLUCAP 166* 140* 158* 148* 178*   Lipid Profile: No results for input(s): CHOL, HDL, LDLCALC, TRIG, CHOLHDL, LDLDIRECT in the last 72 hours. Thyroid Function Tests: No results for input(s): TSH, T4TOTAL, FREET4, T3FREE, THYROIDAB in the last 72 hours. Anemia Panel: No results for input(s): VITAMINB12, FOLATE, FERRITIN, TIBC, IRON, RETICCTPCT in the last 72 hours. Sepsis Labs: No results for input(s): PROCALCITON, LATICACIDVEN in the last 168 hours.  Recent Results (from the past 240 hour(s))  MRSA PCR Screening     Status: None   Collection Time: 05/24/16 11:24 PM  Result Value Ref Range Status   MRSA by PCR NEGATIVE NEGATIVE Final    Comment:        The GeneXpert MRSA Assay (FDA approved for NASAL specimens only), is one component of a comprehensive MRSA colonization surveillance program. It is not intended to diagnose MRSA infection nor to guide or monitor treatment for MRSA infections.   Culture, blood (routine x 2) Call MD if unable to obtain prior to antibiotics being given     Status: None (Preliminary result)   Collection Time: 05/27/16 11:23 AM  Result Value Ref Range Status   Specimen Description BLOOD LEFT ARM  Final   Special Requests IN PEDIATRIC BOTTLE 2CC  Final   Culture NO GROWTH 2 DAYS  Final   Report Status PENDING  Incomplete  Culture, blood (routine x 2) Call MD if unable to obtain prior to antibiotics being  given     Status: None (Preliminary result)   Collection Time: 05/27/16 11:28 AM  Result Value Ref Range Status   Specimen Description BLOOD LEFT HAND  Final   Special Requests IN PEDIATRIC BOTTLE 3CC  Final   Culture NO GROWTH 2 DAYS  Final   Report Status PENDING  Incomplete  Urine culture     Status: Abnormal   Collection Time: 05/27/16  1:24 PM  Result Value Ref Range Status   Specimen Description URINE, CLEAN CATCH  Final   Special Requests NONE  Final   Culture >=100,000 COLONIES/mL ESCHERICHIA COLI (A)  Final   Report Status 05/29/2016 FINAL  Final   Organism ID, Bacteria ESCHERICHIA COLI (A)  Final      Susceptibility   Escherichia coli - MIC*    AMPICILLIN <=2 SENSITIVE Sensitive     CEFAZOLIN <=4 SENSITIVE Sensitive     CEFTRIAXONE <=1  SENSITIVE Sensitive     CIPROFLOXACIN <=0.25 SENSITIVE Sensitive     GENTAMICIN <=1 SENSITIVE Sensitive     IMIPENEM <=0.25 SENSITIVE Sensitive     NITROFURANTOIN <=16 SENSITIVE Sensitive     TRIMETH/SULFA <=20 SENSITIVE Sensitive     AMPICILLIN/SULBACTAM <=2 SENSITIVE Sensitive     PIP/TAZO <=4 SENSITIVE Sensitive     Extended ESBL NEGATIVE Sensitive     * >=100,000 COLONIES/mL ESCHERICHIA COLI  Urine culture     Status: None   Collection Time: 05/28/16  2:12 PM  Result Value Ref Range Status   Specimen Description URINE, RANDOM  Final   Special Requests NONE  Final   Culture NO GROWTH  Final   Report Status 05/29/2016 FINAL  Final         Radiology Studies: Mr Jeri Cos QF Contrast  Result Date: 05/27/2016 CLINICAL DATA:  82 y/o  M; metastatic lung cancer. EXAM: MRI HEAD WITHOUT AND WITH CONTRAST TECHNIQUE: Multiplanar, multiecho pulse sequences of the brain and surrounding structures were obtained without and with intravenous contrast. CONTRAST:  44m MULTIHANCE GADOBENATE DIMEGLUMINE 529 MG/ML IV SOLN COMPARISON:  None. FINDINGS: Brain: No diffusion signal abnormality. Moderate brain parenchymal volume loss. Mild chronic  microvascular ischemic changes in periventricular white matter and the pons. Left anterior corona radiata small no abnormal susceptibility hypointensity to indicate intracranial hemorrhage. Severe motion artifact of T1 postcontrast sequences. No gross evidence for abnormal enhancement. Chronic infarct. Vascular: Normal flow voids. Skull and upper cervical spine: Normal marrow signal. Left temporal scalp 10 mm cyst is probably sebaceous. Sinuses/Orbits: Negative.  Bilateral intra-ocular lens replacement. Other: None. IMPRESSION: 1. Extensive motion artifact of T1 postcontrast sequences. No gross evidence for abnormal enhancement. No signal abnormality on other sequences to suggest intracranial metastatic disease. 2. Moderate brain parenchymal volume loss, mild chronic microvascular ischemic changes, and chronic left anterior corona radiata small lacunar infarct. Electronically Signed   By: LKristine GarbeM.D.   On: 05/27/2016 20:49        Scheduled Meds: . amLODipine  5 mg Oral Daily  . atenolol  50 mg Oral BID  . feeding supplement (ENSURE ENLIVE)  237 mL Oral BID BM  . guaiFENesin  1,200 mg Oral BID  . insulin aspart  0-15 Units Subcutaneous TID WC  . insulin aspart  0-5 Units Subcutaneous QHS  . insulin aspart  3 Units Subcutaneous TID WC  . ipratropium-albuterol  3 mL Nebulization Q6H  . mirtazapine  15 mg Oral QHS  . multivitamin with minerals  1 tablet Oral Daily  . pantoprazole  40 mg Oral Daily  . simvastatin  20 mg Oral q1800  . sodium chloride flush  3 mL Intravenous Q12H   Continuous Infusions:    LOS: 5 days    Time spent: 40 mins    THOMPSON,DANIEL, MD [facility] Pager [phone] If 7PM-7AM, please contact night-coverage [email or web address] Password [email or web address] 05/29/2016, 1:39 PM

## 2016-05-30 ENCOUNTER — Telehealth: Payer: Self-pay | Admitting: *Deleted

## 2016-05-30 DIAGNOSIS — N289 Disorder of kidney and ureter, unspecified: Secondary | ICD-10-CM

## 2016-05-30 DIAGNOSIS — R0602 Shortness of breath: Secondary | ICD-10-CM

## 2016-05-30 LAB — CBC WITH DIFFERENTIAL/PLATELET
Basophils Absolute: 0.1 10*3/uL (ref 0.0–0.1)
Basophils Relative: 1 %
Eosinophils Absolute: 0.5 10*3/uL (ref 0.0–0.7)
Eosinophils Relative: 3 %
HCT: 35.3 % — ABNORMAL LOW (ref 39.0–52.0)
HEMOGLOBIN: 11.8 g/dL — AB (ref 13.0–17.0)
LYMPHS ABS: 2.5 10*3/uL (ref 0.7–4.0)
LYMPHS PCT: 15 %
MCH: 32.1 pg (ref 26.0–34.0)
MCHC: 33.4 g/dL (ref 30.0–36.0)
MCV: 95.9 fL (ref 78.0–100.0)
Monocytes Absolute: 2.2 10*3/uL — ABNORMAL HIGH (ref 0.1–1.0)
Monocytes Relative: 13 %
NEUTROS PCT: 68 %
Neutro Abs: 11 10*3/uL — ABNORMAL HIGH (ref 1.7–7.7)
Platelets: 237 10*3/uL (ref 150–400)
RBC: 3.68 MIL/uL — AB (ref 4.22–5.81)
RDW: 13.6 % (ref 11.5–15.5)
WBC: 16.3 10*3/uL — AB (ref 4.0–10.5)

## 2016-05-30 LAB — BASIC METABOLIC PANEL
ANION GAP: 12 (ref 5–15)
BUN: 30 mg/dL — ABNORMAL HIGH (ref 6–20)
CHLORIDE: 95 mmol/L — AB (ref 101–111)
CO2: 31 mmol/L (ref 22–32)
Calcium: 8.5 mg/dL — ABNORMAL LOW (ref 8.9–10.3)
Creatinine, Ser: 1.22 mg/dL (ref 0.61–1.24)
GFR calc non Af Amer: 54 mL/min — ABNORMAL LOW (ref >60)
Glucose, Bld: 154 mg/dL — ABNORMAL HIGH (ref 65–99)
POTASSIUM: 3.1 mmol/L — AB (ref 3.5–5.1)
SODIUM: 138 mmol/L (ref 135–145)

## 2016-05-30 LAB — GLUCOSE, CAPILLARY
GLUCOSE-CAPILLARY: 153 mg/dL — AB (ref 65–99)
Glucose-Capillary: 137 mg/dL — ABNORMAL HIGH (ref 65–99)
Glucose-Capillary: 167 mg/dL — ABNORMAL HIGH (ref 65–99)
Glucose-Capillary: 173 mg/dL — ABNORMAL HIGH (ref 65–99)

## 2016-05-30 MED ORDER — POTASSIUM CHLORIDE CRYS ER 20 MEQ PO TBCR
40.0000 meq | EXTENDED_RELEASE_TABLET | ORAL | Status: AC
  Administered 2016-05-30 (×2): 40 meq via ORAL
  Filled 2016-05-30 (×2): qty 2

## 2016-05-30 NOTE — Progress Notes (Signed)
  Radiation Oncology         [phone] ________________________________  Name: ABDOUL ENCINAS MRN: [id number]  Date: 05/28/2016  DOB: Dec 16, 1932  SIMULATION AND TREATMENT PLANNING NOTE  DIAGNOSIS:     ICD-9-CM ICD-10-CM   1. Bone metastases (HCC) 198.5 C79.51      Site:   1.  Right shoulder 2.  Left shoulder 3.  Sternum  NARRATIVE:  The patient was brought to the [place].  Identity was confirmed.  All relevant records and images related to the planned course of therapy were reviewed.   Written consent to proceed with treatment was confirmed which was freely given after reviewing the details related to the planned course of therapy had been reviewed with the patient.  Then, the patient was set-up in a stable reproducible  supine position for radiation therapy.  CT images were obtained.  Surface markings were placed.    Medically necessary complex treatment device(s) for immobilization:  N/A.   The CT images were loaded into the planning software.  Then the target and avoidance structures were contoured.  Treatment planning then occurred.  The radiation prescription was entered and confirmed.  A total of 5 complex treatment devices were fabricated which relate to the designed radiation treatment fields. Each of these customized fields/ complex treatment devices will be used on a daily basis during the radiation course. I have requested : 3D Simulation  I have requested a DVH of the following structures: Target volume, left lung, right lung, heart.   PLAN:  The patient will receive 28 Gy in 8 fractions.  ________________________________   Jodelle Gross, MD, PhD

## 2016-05-30 NOTE — Telephone Encounter (Signed)
Called [facility]  16-C,for patient ,spoker with Lambert Keto RN, patyient is doing better stated, less confusion, eating better,  Asked that she arrange transport with [facility] transportation for the patient to be here at the Cancer center tomorrow for his 1245pm radiation treatment, she stated she would call them 12:28 PM  .m

## 2016-05-30 NOTE — Progress Notes (Signed)
PROGRESS NOTE    Alexander Landry  HWE:[id number] DOB: September 27, 1932 DOA: 05/24/2016 PCP: [name], MD    Brief Narrative:  Alexander Landry a 82 y.o.malewith medical history significant of hypertension, diabetes, acid reflux, kyphosis, history of Hodgkin's lymphoma is status post systemic chemotherapy about 20 years ago, recently diagnosed with metastatic bronchogenic adenocarcinoma, sent from oncology clinic for serum potassium level of 8. Patient was recently complaining of chest pain when CT scan of chest was done which was consistent with poorly nodule. Patient underwent PET scan and CT guided core biopsy of the right lower lung by IR on October 30. The final pathology result came back adenocarcinoma. Patient came to oncology clinic first time today for initiation of treatment. At oncology clinic, the lab work up was done. Potassium level was found to be 8 with creatinine of 3.2. Patient was sent to ED, was evaluated by nephrology and was transferred to [facility] for further workup.  Patient's hyperkalemia improved and renal function trended down. Patient noted to have a fever and leukocytosis workup revealed right lower lobe pneumonia. Patient started empirically on IV vancomycin and IV cefepime. Patient also went to [facility] long hospital for simulation in anticipation of radiation treatments.     Assessment & Plan:   Principal Problem:   Hyperkalemia Active Problems:   Acute kidney injury ([place])   HCAP (healthcare-associated pneumonia)   Diabetes mellitus type 2 with complications ([place])   Hypertension associated with diabetes ([place])   History of Hodgkin's lymphoma   Protein-calorie malnutrition, severe   Hypotension   Lung cancer ([place])   E-coli UTI  #1 severe hyperkalemia in the setting of acute renal failure and ACE inhibitor Improved. Potassium at 3.1 today. EKG did show peaked T waves. Patient received medical management of albuterol, bicarbonate, insulin,  dextrose, IV fluids and Kayexalate. D/C'd sodium bicarbonate drip. Hyperkalemia resolved. Nephrology assess the patient during this hospitalization. Follow.  #2 acute kidney injury in the setting of severe dehydration, ACE inhibitor, NSAIDs Improving. Creatinine now at 1.22. Saline lock IV fluids. Discontinued bicarbonate drip.  No ACE inhibitor or NSAIDs in the future.Dr Tana Coast discussed with nephrology, Dr Jonnie Finner who had recommended continuing bicarbonate drip for 1-2 days until renal function not completely resolved. Bicarbonate levels increasing and as such discontinued bicarbonate. Follow.  #3 probable healthcare associated pneumonia Patient states has a mild cough. Patient with a MAXIMUM TEMPERATURE of 100.8 early this morning with a fluctuating leukocytosis. Chest x-ray from 05/27/2016 showed a right basilar infiltrate. Due to patient's recent diagnosis of metastatic lung cancer will treat as a healthcare associated pneumonia as patient is likely immunocompromised. Blood cultures pending. Urine strep pneumococcus antigen negative. Influenza A PCR negative. HIV nonreactive. Sputum Gram stain and culture pending. Patient was placed empirically on IV vancomycin and IV cefepime. Infectious diseases was consulted due to patient's fever which they felt was likely tumor related. As patient was not hypoxic with no particular signs of pneumonia in including no increased shortness of breath or no increased cough or congestion was recommended per ID that patient be observed off antibiotics and to ambulate to mobilize secretions.  #4 acute encephalopathy Likely medication induced. Patient with clinical improvement. Discontinued mittens. Marinol and tramadol were discontinued with clinical improvement. MRI of head with and without contrast, with no abnormalities to suggest intracranial metastatic disease. No acute infarct noted either.  #5 moderate protein calorie malnutrition Nutritional supplementations.  Patient started on Remeron with improved appetite.  #6 diabetes mellitus type 2 CBGs have  ranged from 137 - 167. Continue sliding scale insulin. Oral hypoglycemic agents on hold.  #7 metastatic lung cancer Patient recently had biopsy and undergoing treatment evaluation per oncology, Dr. [name] as well as radiation oncologist. MRI of head with and without contrast was done 05/27/2016 with no gross evidence of abnormal enhancement. No signal abnormality to suggest intracranial metastatic disease. Moderate brain parenchymal volume loss, mild chronic microvascular ischemic changes, chronic left anterior corona radiator small lacunar infarct.  Patient underwent simulation for radiation treatment at [facility] long hospital 05/28/2016. Patient scheduled for radiation treatment tomorrow 05/31/2016. Outpatient follow-up.  #8 hypotension Secondary to hypovolemia. Improved with IV fluids. Saline lock IV fluids.   #9 hypertension Continue atenolol and Norvasc.  #10 Escherichia coli UTI S/p 3 days IV cefepime.  #11 hypokalemia Replete.  #12 fevers ?? Etiology. Per ID likely related to tumor. Initially felt likely secondary to pneumonia and E Coli uti. Blood cultures pending with no growth to date. Patient received 3 days of empiric IV vancomycin and IV cefepime. IV antibiotics were discontinued. Infectious diseases on 05/29/2016 and recommending to monitor off antibiotics. Appreciate ID input and recommendations.      DVT prophylaxis: SCDs Code Status: Full Family Communication: Updated patient and wife at bedside. Disposition Plan: Home when confusion has improved. Renal function has improved and hyperkalemia resolved. Afebrile for at least 24-48 hours with improvement with leukocytosis.    Consultants:   Nephrology: Dr.Schertz 05/24/2016  infectious disease: Dr.Comer 05/29/2016  Procedures:   Renal ultrasound 05/24/2016  Chest x-ray 05/27/2016  MRI HEAD  05/27/2016  Antimicrobials:  IV vancomycin 05/27/2016>>>>> 05/29/2016  IV cefepime 05/27/2016>>>>>>> 05/29/2016   Subjective: Patient alert. Sitting up in chair. No SOB. No CP. Patient states cough improved. Following commands. Per family appetite improved since starting Remeron.  Objective: Vitals:   05/29/16 1444 05/29/16 2205 05/29/16 2241 05/30/16 0510  BP: (!) 131/58 (!) 163/63  125/69  Pulse: 89 73  73  Resp: '16 18  18  '$ Temp: 98.9 F (37.2 C)  100.3 F (37.9 C) (!) 100.8 F (38.2 C)  TempSrc: Oral  Rectal Rectal  SpO2: 90% 94%  96%  Weight:    91.8 kg (202 lb 6.4 oz)  Height:       No intake or output data in the 24 hours ending 05/30/16 1340 Filed Weights   05/28/16 0635 05/29/16 0519 05/30/16 0510  Weight: 92.2 kg (203 lb 3.2 oz) 90.9 kg (200 lb 6.4 oz) 91.8 kg (202 lb 6.4 oz)    Examination:  General exam: Appears calm and comfortable. Sleeping. Respiratory system: CTAB. Respiratory effort normal. Cardiovascular system: S1 & S2 heard, Irregularly irregular. No JVD, murmurs, rubs, gallops or clicks. No pedal edema. Gastrointestinal system: Abdomen is nondistended, soft and nontender. No organomegaly or masses felt. Normal bowel sounds heard. Central nervous system: Alert and oriented. No focal neurological deficits. Extremities: Symmetric 5 x 5 power. Skin: No rashes, lesions or ulcers Psychiatry: Judgement and insight appear normal. Mood & affect appropriate.     Data Reviewed: I have personally reviewed following labs and imaging studies  CBC:  Recent Labs Lab 05/24/16 1324  05/26/16 0328 05/27/16 0342 05/28/16 0715 05/29/16 0538 05/30/16 0825  WBC 10.6*  < > 13.3* 16.1* 17.1* 16.8* 16.3*  NEUTROABS 6.5  --   --   --   --  11.5* 11.0*  HGB 13.6  < > 12.9* 12.8* 12.0* 12.3* 11.8*  HCT 40.3  < > 37.8* 38.8* 36.1* 36.6* 35.3*  MCV 95.5  < >  93.3 95.3 95.8 95.3 95.9  PLT 320  < > 264 272 242 241 237  < > = values in this interval not  displayed. Basic Metabolic Panel:  Recent Labs Lab 05/26/16 0930 05/27/16 0342 05/28/16 0850 05/29/16 0538 05/30/16 0825  NA 143 143 140 137 138  K 3.6 4.3 3.2* 3.4* 3.1*  CL 101 98* 95* 93* 95*  CO2 29 38* '31 31 31  '$ GLUCOSE 186* 185* 150* 155* 154*  BUN 38* 38* 37* 41* 30*  CREATININE 1.29* 1.48* 1.43* 1.36* 1.22  CALCIUM 9.2 9.1 8.6* 8.4* 8.5*   GFR: Estimated Creatinine Clearance: 56.8 mL/min (by C-G formula based on SCr of 1.22 mg/dL). Liver Function Tests:  Recent Labs Lab 05/24/16 1324  AST 8  ALT 13  ALKPHOS 118  BILITOT 0.44  PROT 8.2  ALBUMIN 3.2*   No results for input(s): LIPASE, AMYLASE in the last 168 hours. No results for input(s): AMMONIA in the last 168 hours. Coagulation Profile: No results for input(s): INR, PROTIME in the last 168 hours. Cardiac Enzymes: No results for input(s): CKTOTAL, CKMB, CKMBINDEX, TROPONINI in the last 168 hours. BNP (last 3 results) No results for input(s): PROBNP in the last 8760 hours. HbA1C: No results for input(s): HGBA1C in the last 72 hours. CBG:  Recent Labs Lab 05/29/16 1205 05/29/16 1726 05/29/16 2203 05/30/16 0800 05/30/16 1151  GLUCAP 178* 177* 151* 137* 153*   Lipid Profile: No results for input(s): CHOL, HDL, LDLCALC, TRIG, CHOLHDL, LDLDIRECT in the last 72 hours. Thyroid Function Tests: No results for input(s): TSH, T4TOTAL, FREET4, T3FREE, THYROIDAB in the last 72 hours. Anemia Panel: No results for input(s): VITAMINB12, FOLATE, FERRITIN, TIBC, IRON, RETICCTPCT in the last 72 hours. Sepsis Labs: No results for input(s): PROCALCITON, LATICACIDVEN in the last 168 hours.  Recent Results (from the past 240 hour(s))  MRSA PCR Screening     Status: None   Collection Time: 05/24/16 11:24 PM  Result Value Ref Range Status   MRSA by PCR NEGATIVE NEGATIVE Final    Comment:        The GeneXpert MRSA Assay (FDA approved for NASAL specimens only), is one component of a comprehensive MRSA  colonization surveillance program. It is not intended to diagnose MRSA infection nor to guide or monitor treatment for MRSA infections.   Culture, blood (routine x 2) Call MD if unable to obtain prior to antibiotics being given     Status: None (Preliminary result)   Collection Time: 05/27/16 11:23 AM  Result Value Ref Range Status   Specimen Description BLOOD LEFT ARM  Final   Special Requests IN PEDIATRIC BOTTLE 2CC  Final   Culture NO GROWTH 2 DAYS  Final   Report Status PENDING  Incomplete  Culture, blood (routine x 2) Call MD if unable to obtain prior to antibiotics being given     Status: None (Preliminary result)   Collection Time: 05/27/16 11:28 AM  Result Value Ref Range Status   Specimen Description BLOOD LEFT HAND  Final   Special Requests IN PEDIATRIC BOTTLE 3CC  Final   Culture NO GROWTH 2 DAYS  Final   Report Status PENDING  Incomplete  Urine culture     Status: Abnormal   Collection Time: 05/27/16  1:24 PM  Result Value Ref Range Status   Specimen Description URINE, CLEAN CATCH  Final   Special Requests NONE  Final   Culture >=100,000 COLONIES/mL ESCHERICHIA COLI (A)  Final   Report Status 05/29/2016 FINAL  Final  Organism ID, Bacteria ESCHERICHIA COLI (A)  Final      Susceptibility   Escherichia coli - MIC*    AMPICILLIN <=2 SENSITIVE Sensitive     CEFAZOLIN <=4 SENSITIVE Sensitive     CEFTRIAXONE <=1 SENSITIVE Sensitive     CIPROFLOXACIN <=0.25 SENSITIVE Sensitive     GENTAMICIN <=1 SENSITIVE Sensitive     IMIPENEM <=0.25 SENSITIVE Sensitive     NITROFURANTOIN <=16 SENSITIVE Sensitive     TRIMETH/SULFA <=20 SENSITIVE Sensitive     AMPICILLIN/SULBACTAM <=2 SENSITIVE Sensitive     PIP/TAZO <=4 SENSITIVE Sensitive     Extended ESBL NEGATIVE Sensitive     * >=100,000 COLONIES/mL ESCHERICHIA COLI  Urine culture     Status: None   Collection Time: 05/28/16  2:12 PM  Result Value Ref Range Status   Specimen Description URINE, RANDOM  Final   Special Requests  NONE  Final   Culture NO GROWTH  Final   Report Status 05/29/2016 FINAL  Final         Radiology Studies: No results found.      Scheduled Meds: . amLODipine  5 mg Oral Daily  . atenolol  50 mg Oral BID  . feeding supplement (ENSURE ENLIVE)  237 mL Oral BID BM  . guaiFENesin  1,200 mg Oral BID  . insulin aspart  0-15 Units Subcutaneous TID WC  . insulin aspart  0-5 Units Subcutaneous QHS  . insulin aspart  3 Units Subcutaneous TID WC  . mirtazapine  15 mg Oral QHS  . multivitamin with minerals  1 tablet Oral Daily  . pantoprazole  40 mg Oral Daily  . potassium chloride  40 mEq Oral Q4H  . simvastatin  20 mg Oral q1800  . sodium chloride flush  3 mL Intravenous Q12H   Continuous Infusions:    LOS: 6 days    Time spent: 40 mins    Whisper Kurka, MD [facility] Pager [phone]  If 7PM-7AM, please contact night-coverage [email or web address] Password [email or web address] 05/30/2016, 1:40 PM

## 2016-05-30 NOTE — Progress Notes (Signed)
[facility]ious [facility]   Reason for visit: Follow up on fever  Interval History: remains off of antibiotics, Tmax 100.8; no new symptoms including no diarrhea, no increased cough, congestion, sob per family at bedside; much better appetite since starting steroids  Physical Exam: Constitutional:  Vitals:   05/29/16 2241 05/30/16 0510  BP:  125/69  Pulse:  73  Resp:  18  Temp: 100.3 F (37.9 C) (!) 100.8 F (38.2 C)   patient appears in NAD, sleeping Skin: no rashes  Review of Systems: Constitutional: negative for malaise Integument/breast: negative for rash Per family  Lab Results  Component Value Date   WBC 16.3 (H) 05/30/2016   HGB 11.8 (L) 05/30/2016   HCT 35.3 (L) 05/30/2016   MCV 95.9 05/30/2016   PLT 237 05/30/2016    Lab Results  Component Value Date   CREATININE 1.22 05/30/2016   BUN 30 (H) 05/30/2016   NA 138 05/30/2016   K 3.1 (L) 05/30/2016   CL 95 (L) 05/30/2016   CO2 31 05/30/2016    Lab Results  Component Value Date   ALT 13 05/24/2016   AST 8 05/24/2016   ALKPHOS 118 05/24/2016     Microbiology: Recent Results (from the past 240 hour(s))  MRSA PCR Screening     Status: None   Collection Time: 05/24/16 11:24 PM  Result Value Ref Range Status   MRSA by PCR NEGATIVE NEGATIVE Final    Comment:        The GeneXpert MRSA Assay (FDA approved for NASAL specimens only), is one component of a comprehensive MRSA colonization surveillance program. It is not intended to diagnose MRSA infection nor to guide or monitor treatment for MRSA infections.   Culture, blood (routine x 2) Call MD if unable to obtain prior to antibiotics being given     Status: None (Preliminary result)   Collection Time: 05/27/16 11:23 AM  Result Value Ref Range Status   Specimen Description BLOOD LEFT ARM  Final   Special Requests IN PEDIATRIC BOTTLE 2CC  Final   Culture NO GROWTH 2 DAYS  Final   Report Status PENDING  Incomplete  Culture, blood  (routine x 2) Call MD if unable to obtain prior to antibiotics being given     Status: None (Preliminary result)   Collection Time: 05/27/16 11:28 AM  Result Value Ref Range Status   Specimen Description BLOOD LEFT HAND  Final   Special Requests IN PEDIATRIC BOTTLE 3CC  Final   Culture NO GROWTH 2 DAYS  Final   Report Status PENDING  Incomplete  Urine culture     Status: Abnormal   Collection Time: 05/27/16  1:24 PM  Result Value Ref Range Status   Specimen Description URINE, CLEAN CATCH  Final   Special Requests NONE  Final   Culture >=100,000 COLONIES/mL ESCHERICHIA COLI (A)  Final   Report Status 05/29/2016 FINAL  Final   Organism ID, Bacteria ESCHERICHIA COLI (A)  Final      Susceptibility   Escherichia coli - MIC*    AMPICILLIN <=2 SENSITIVE Sensitive     CEFAZOLIN <=4 SENSITIVE Sensitive     CEFTRIAXONE <=1 SENSITIVE Sensitive     CIPROFLOXACIN <=0.25 SENSITIVE Sensitive     GENTAMICIN <=1 SENSITIVE Sensitive     IMIPENEM <=0.25 SENSITIVE Sensitive     NITROFURANTOIN <=16 SENSITIVE Sensitive     TRIMETH/SULFA <=20 SENSITIVE Sensitive     AMPICILLIN/SULBACTAM <=2 SENSITIVE Sensitive     PIP/TAZO <=  4 SENSITIVE Sensitive     Extended ESBL NEGATIVE Sensitive     * >=100,000 COLONIES/mL ESCHERICHIA COLI  Urine culture     Status: None   Collection Time: 05/28/16  2:12 PM  Result Value Ref Range Status   Specimen Description URINE, RANDOM  Final   Special Requests NONE  Final   Culture NO GROWTH  Final   Report Status 05/29/2016 FINAL  Final    Impression/Plan:  1. Fever - no signs of infection.  Continue to observe off of antibiotics 2. Renal insufficiency - will avoid NSAIDS, now improved Ok from ID standpoint for radiation Patient's family asking about getting him up to walk around today  Dr Baxter Flattery available tomorrow for any changes, otherwise I will sign off. thanks

## 2016-05-31 ENCOUNTER — Ambulatory Visit: Payer: Commercial Managed Care - HMO | Admitting: Emergency Medicine

## 2016-05-31 ENCOUNTER — Ambulatory Visit
Admission: RE | Admit: 2016-05-31 | Discharge: 2016-05-31 | Disposition: A | Discharge status: Home / Self Care | Payer: Commercial Managed Care - HMO | Source: Ambulatory Visit | Attending: Radiation Oncology | Admitting: Radiation Oncology

## 2016-05-31 ENCOUNTER — Ambulatory Visit: Payer: Commercial Managed Care - HMO

## 2016-05-31 ENCOUNTER — Telehealth: Payer: Self-pay | Admitting: *Deleted

## 2016-05-31 DIAGNOSIS — R509 Fever, unspecified: Secondary | ICD-10-CM

## 2016-05-31 LAB — CBC WITH DIFFERENTIAL/PLATELET
Basophils Absolute: 0.1 10*3/uL (ref 0.0–0.1)
Basophils Relative: 0 %
Eosinophils Absolute: 0.4 10*3/uL (ref 0.0–0.7)
Eosinophils Relative: 2 %
HEMATOCRIT: 32.3 % — AB (ref 39.0–52.0)
HEMOGLOBIN: 10.5 g/dL — AB (ref 13.0–17.0)
LYMPHS ABS: 3 10*3/uL (ref 0.7–4.0)
Lymphocytes Relative: 14 %
MCH: 31.5 pg (ref 26.0–34.0)
MCHC: 32.5 g/dL (ref 30.0–36.0)
MCV: 97 fL (ref 78.0–100.0)
MONO ABS: 2.7 10*3/uL — AB (ref 0.1–1.0)
MONOS PCT: 13 %
NEUTROS ABS: 14.8 10*3/uL — AB (ref 1.7–7.7)
NEUTROS PCT: 71 %
Platelets: 313 10*3/uL (ref 150–400)
RBC: 3.33 MIL/uL — ABNORMAL LOW (ref 4.22–5.81)
RDW: 14.3 % (ref 11.5–15.5)
WBC: 21 10*3/uL — ABNORMAL HIGH (ref 4.0–10.5)

## 2016-05-31 LAB — BASIC METABOLIC PANEL
ANION GAP: 11 (ref 5–15)
BUN: 25 mg/dL — AB (ref 6–20)
CALCIUM: 8.5 mg/dL — AB (ref 8.9–10.3)
CO2: 30 mmol/L (ref 22–32)
Chloride: 100 mmol/L — ABNORMAL LOW (ref 101–111)
Creatinine, Ser: 1.18 mg/dL (ref 0.61–1.24)
GFR calc Af Amer: >60 mL/min (ref >60)
GFR calc non Af Amer: 56 mL/min — ABNORMAL LOW (ref >60)
GLUCOSE: 148 mg/dL — AB (ref 65–99)
POTASSIUM: 3.1 mmol/L — AB (ref 3.5–5.1)
Sodium: 141 mmol/L (ref 135–145)

## 2016-05-31 LAB — GLUCOSE, CAPILLARY: Glucose-Capillary: 147 mg/dL — ABNORMAL HIGH (ref 65–99)

## 2016-05-31 MED ORDER — ACETAMINOPHEN 650 MG RE SUPP
650.0000 mg | RECTAL | Status: DC | PRN
  Administered 2016-05-31: 650 mg via RECTAL
  Filled 2016-05-31: qty 1

## 2016-05-31 MED ORDER — ACETAMINOPHEN 500 MG PO TABS: 500.0000 mg | ORAL_TABLET | ORAL | Status: AC | PRN

## 2016-05-31 MED ORDER — POTASSIUM CHLORIDE ER 10 MEQ PO TBCR: 20.0000 meq | EXTENDED_RELEASE_TABLET | Freq: Every day | ORAL | 0 refills | Status: DC

## 2016-05-31 MED ORDER — AMOXICILLIN 500 MG PO TABS: 500.0000 mg | ORAL_TABLET | Freq: Two times a day (BID) | ORAL | 0 refills | Status: DC

## 2016-05-31 MED ORDER — SODIUM CHLORIDE 0.9 % IV BOLUS (SEPSIS)
500.0000 mL | Freq: Once | INTRAVENOUS | Status: AC
  Administered 2016-05-31: 500 mL via INTRAVENOUS

## 2016-05-31 MED ORDER — AMOXICILLIN 500 MG PO CAPS
500.0000 mg | ORAL_CAPSULE | Freq: Two times a day (BID) | ORAL | Status: DC
  Filled 2016-05-31: qty 1

## 2016-05-31 MED ORDER — MIRTAZAPINE 15 MG PO TABS: 15.0000 mg | ORAL_TABLET | Freq: Every day | ORAL | 0 refills | Status: DC

## 2016-05-31 MED ORDER — ENSURE ENLIVE PO LIQD: 237.0000 mL | Freq: Two times a day (BID) | ORAL | 0 refills | Status: DC

## 2016-05-31 MED ORDER — AMLODIPINE BESYLATE 5 MG PO TABS: 5.0000 mg | ORAL_TABLET | Freq: Every day | ORAL | 3 refills | Status: DC

## 2016-05-31 MED ORDER — POTASSIUM CHLORIDE CRYS ER 20 MEQ PO TBCR
40.0000 meq | EXTENDED_RELEASE_TABLET | ORAL | Status: AC
  Administered 2016-05-31: 40 meq via ORAL
  Filled 2016-05-31: qty 2

## 2016-05-31 MED ORDER — LORAZEPAM 2 MG/ML IJ SOLN: 1.0000 mg | Freq: Once | INTRAMUSCULAR | Status: DC

## 2016-05-31 NOTE — Progress Notes (Signed)
OT Cancellation Note  Patient Details Name: Alexander Landry MRN: [id number] DOB: 09/15/1932   Cancelled Treatment:    Reason Eval/Treat Not Completed: Patient declined, no reason specified ( just returning from radiation and currently declines therapy)  Vonita Moss   OTR/L Pager: [phone] Office: [phone] .  05/31/2016, 3:01 PM

## 2016-05-31 NOTE — Care Management Note (Signed)
Case Management Note  Patient Details  Name: Alexander Landry MRN: [id number] Date of Birth: [birth year]  Subjective/Objective:                 Spoke with patient's wife at the bedside, and son Alexander Landry over the phone. Cm explained difference btwn private duty services and skilled home health services. Provided list of providers on [facility], they would [employer] [facility], referral made, patient accepted for PT OT HHA SW, also placed order for wheelchair, it cannot be delivered to the house, CM requested [facility] to deliver to room to prior to discharge.   Action/Plan:  DC to home with [facility] and wheelchair.   Expected Discharge Date:   (unknown)               Expected Discharge Plan:  [place]  In-House Referral:  NA  Discharge planning Services  CM Consult  Post Acute Care Choice:  Durable Medical Equipment, Home Health Choice offered to:  Adult Children, Spouse  DME Arranged:  Programmer, multimedia DME Agency:  [facility] Arranged:  PT, OT, Nurse's Aide, Social Work [employer] Agency:  [facility]  Status of Service:  Completed, signed off  If discussed at [employer], dates discussed:    Additional Comments:  Carles Collet, RN 05/31/2016, 4:20 PM

## 2016-05-31 NOTE — Progress Notes (Addendum)
Notified Schorr, NP that pt having jaw/mouth twitching that mimics seizure like activity. Notified Wes, rapid response RN of this change with pt. Wes, rapid response RN came to bedside to assess pt. Wes stated that this was not seizure activity. Charge nurse, Ventura Bruns at bedside to assess pt. Pt's temp is 103.0. NP gave order for tylenol suppository and NS bolus. Will continue to monitor pt. Ranelle Oyster, RN

## 2016-05-31 NOTE — Progress Notes (Signed)
Prathik Selders to be D/C'd home per MD order.  Discussed with the patient and all questions fully answered.  VSS, Skin clean, dry and intact without evidence of skin break down, no evidence of skin tears noted. IV catheter discontinued intact. Site without signs and symptoms of complications. Dressing and pressure applied.  An After Visit Summary was printed and given to the patient. Patient received prescription.  D/c education completed with patient/family including follow up instructions, medication list, d/c activities limitations if indicated, with other d/c instructions as indicated by MD - patient able to verbalize understanding, all questions fully answered.   Patient instructed to return to ED, call 911, or call MD for any changes in condition.   Patient escorted via [place], and D/C home via private auto with his wife.  Winfield Rast, RN  05/31/2016 1745

## 2016-05-31 NOTE — Telephone Encounter (Addendum)
Called [facility] 5W16-c spoke with RN Starlyn Skeans, [facility] to have patient here at 1215pm,. Thanked RN,called [facility] to confirm, spoke with Abbe Amsterdam on [phone], 'Yes, he is on the schedule to be her around noon-1215,thanked Phil, and called [facility], gave updated status, patient spiked a fever last night, not going to be d/c 8:18 AM

## 2016-05-31 NOTE — Progress Notes (Signed)
PT Cancellation Note  Patient Details Name: TERRIS GERMANO MRN: [id number] DOB: 1933-05-18   Cancelled Treatment:    Reason Eval/Treat Not Completed: Patient at procedure or test/unavailable, gone to radiation. Will check back later.   Marcelino Freestone PT [phone]  05/31/2016, 12:26 PM

## 2016-05-31 NOTE — Discharge Summary (Signed)
Physician Discharge Summary  Alexander Landry IZT:[id number] DOB: 19-Dec-1932 DOA: 05/24/2016  PCP: [name], MD  Admit date: 05/24/2016 Discharge date: 05/31/2016  Time spent: 65 minutes  Recommendations for Outpatient Follow-up:  1. Follow-up with [name], MD in 1-2 weeks. On follow-up patient will need a CBC as well as a basic metabolic profile done to follow-up on leukocytosis, electrolytes, renal function. 2. Follow-up with Dr. Lorna Few of oncology as previously scheduled. 3. Follow-up with radiation as scheduled.   Discharge Diagnoses:  Principal Problem:   Hyperkalemia Active Problems:   Acute kidney injury ([place])   HCAP (healthcare-associated pneumonia)   Diabetes mellitus type 2 with complications ([place])   Hypertension associated with diabetes ([place])   History of Hodgkin's lymphoma   Protein-calorie malnutrition, severe   Hypotension   Lung cancer ([place])   E-coli UTI   Fever   Discharge Condition: Stable and improved  Diet recommendation: Regular  Filed Weights   05/29/16 0519 05/30/16 0510 05/31/16 0613  Weight: 90.9 kg (200 lb 6.4 oz) 91.8 kg (202 lb 6.4 oz) 89.2 kg (196 lb 10.4 oz)    History of present illness:  Per Dr. Karena Addison is a 82 y.o. male with medical history significant of hypertension, diabetes, acid reflux, kyphosis, history of Hodgkin's lymphoma is status post systemic chemotherapy about 20 years ago, recently diagnosed with metastatic bronchogenic adenocarcinoma, sent from oncology clinic for serum potassium level of 8. Patient was recently complaining of chest pain when CT scan of chest was done which was consistent with poorly nodule. Patient underwent PET scan and CT guided core biopsy of the right lower lung by IR on October 30. The final pathology result came back adenocarcinoma. Patient went to oncology clinic first time today for initiation of treatment. At oncology clinic, the lab work up was  done. Later on serum potassium level was found to be 8 and renal failure. Patient was brought to emergency room.  Patient reported feeling weak tired. He has not been eating or drinking well for about a month. He has been taking Advil for the pain management. His energy level was low. Also has on intestinal weight loss about 20 pounds in last few months. He denied headache, nausea, vomiting, abdominal pain. Denied shortness of breath or cough. He has chronic chest discomfort. Reports decreasing urine output. Denies dysuria, urgency. Also has loose bowel movement. Patient's wife and son accompanied him in the ER. ED Course: In the ER patient was evaluated by nephrologist. He was treated with IV calcium, albuterol, accelerated, insulin, dextrose for the management of hyperkalemia. Blood pressure improved after IV fluid. He was admitted for further evaluation.   Hospital Course:  #1 severe hyperkalemia in the setting of acute renal failure and ACE inhibitor Patient was admitted with hyperkalemia with a potassium level of 7.9. EKG did show peaked T waves. Patient received medical management of albuterol, bicarbonate, insulin, dextrose, IV fluids and Kayexalate. Patient was seen by nephrologist during hospitalization. Patient's hyperkalemia resolved during the hospitalization. Patient will be discharged in stable and improved condition.   #2 acute kidney injury in the setting of severe dehydration, ACE inhibitor, NSAIDs Improved. Creatinine now at 1.18 by day of discharge. On admission patient was noted to be in acute renal failure and hyperkalemic. Nephrology was consulted. Patient was placed on IV fluids as well as by cup drink. ACE inhibitor was discontinued. Patient was monitored. Patient's renal function improved on a daily basis. Bicarbonate drip was subsequently discontinued. Was recommended  that patient will be resumed on ACE inhibitor or NSAIDs. Patient's renal function improved on a daily basis  such that by [facility] discharge creatinine was down to 1.18. Outpatient follow-up with PCP.   #3 probable healthcare associated pneumonia Patient states has a mild cough. Patient with a MAXIMUM TEMPERATURE of 103 early this morning with a worsening leukocytosis. Chest x-ray from 05/27/2016 showed a right basilar infiltrate. Due to patient's recent diagnosis of metastatic lung cancer patient was initially treated as a healthcare associated pneumonia as patient is likely immunocompromised. Blood cultures were done with no growth to date and were pending at time of discharge. Urine strep pneumococcus antigen negative. Influenza A PCR negative. HIV nonreactive. Sputum Gram stain and culture pending. Patient was placed empirically on IV vancomycin and IV cefepime. Infectious diseases was consulted due to patient's fever which they felt was likely tumor related. As patient was not hypoxic with no particular signs of pneumonia in including no increased shortness of breath or no increased cough or congestion was recommended per ID that patient be observed off antibiotics and to ambulate to mobilize secretions. Patient noted to have a MAXIMUM TEMPERATURE of 103 overnight of 05/30/2016. ID reassessed and felt fever was secondary to tumor. Patient also be discharged on oral amoxicillin 3 days for Escherichia coli UTI.  #4 acute encephalopathy Likely medication induced. Patient with clinical improvement. Discontinued mittens. Marinol and tramadol were discontinued with clinical improvement. MRI of head with and without contrast, with no abnormalities to suggest intracranial metastatic disease. No acute infarct noted either.  #5 moderate protein calorie malnutrition Nutritional supplementations. Patient started on Remeron with improved appetite.  #6 diabetes mellitus type 2 Patient's oral hypoglycemic agents were held during the hospitalization. Patient was maintained on sliding scale insulin.  #7 metastatic lung  cancer Patient recently had biopsy and undergoing treatment evaluation per oncology, Dr. [name] as well as radiation oncologist. MRI of head with and without contrast was done 05/27/2016 with no gross evidence of abnormal enhancement. No signal abnormality to suggest intracranial metastatic disease. Moderate brain parenchymal volume loss, mild chronic microvascular ischemic changes, chronic left anterior corona radiator small lacunar infarct.  Patient underwent simulation for radiation treatment at [facility] 05/28/2016. Patient scheduled for first radiation treatment 05/31/2016 which he underwent prior to discharge. Outpatient follow-up.  #8 hypotension Secondary to hypovolemia. Improved with hydration. Had resolved by day of discharge.   #9 hypertension Patient was maintained on atenolol and a decreased dose of Norvasc for blood pressure which remained stable throughout the hospitalization. Patient's ACE inhibitor was discontinued secondary to acute renal failure and hyperkalemia on admission. Outpatient follow-up.  #10 Escherichia coli UTI S/p 3 days IV cefepime. Patient be discharged on 4 more days of oral amoxicillin to complete a one-week course of antibiotic treatment per ID recommendations.  #11 hypokalemia Repleted.  #12 fevers Per ID likely related to tumor. Initially felt likely secondary to pneumonia and E Coli uti. Blood cultures pending with no growth to date. Patient received 3 days of empiric IV vancomycin and IV cefepime. IV antibiotics were discontinued by Infectious diseases on 05/29/2016 and recommended to monitor off antibiotics.  Patient noted to have a MAXIMUM TEMPERATURE of 103 the night of 05/30/2016.patient noted to have a leukocytosis of 21. ID was called who reassess the patient and reviewed the chart with films and lab work and felt fevers were likely tumor related. It was recommended that patient may be discharged on amoxicillin 500 mg twice daily for  4 days  with outpatient follow-up for repeat CBC and lab work. Patient will be discharged home in stable and improved condition and will follow-up with PCP in 1-2 weeks.      Procedures:  Renal ultrasound 05/24/2016  Chest x-ray 05/27/2016  MRI HEAD 05/27/2016  Consultations:  Nephrology: Dr.Schertz 05/24/2016  infectious disease: Dr.Comer 05/29/2016   Discharge Exam: Vitals:   05/31/16 0826 05/31/16 0827  BP: (!) 123/54 (!) 123/54  Pulse:  66  Resp:    Temp:      General: NAD Cardiovascular: RRR Respiratory: CTAB  Discharge Instructions   Discharge Instructions    Diet general    Complete by:  As directed    Discharge instructions    Complete by:  As directed    No NSAIDs or ACE inhibitors.   Increase activity slowly    Complete by:  As directed      Current Discharge Medication List    START taking these medications   Details  acetaminophen (TYLENOL) 500 MG tablet Take 1 tablet (500 mg total) by mouth every 4 (four) hours as needed for fever, headache or mild pain.    amoxicillin (AMOXIL) 500 MG tablet Take 1 tablet (500 mg total) by mouth 2 (two) times daily. Qty: 8 tablet, Refills: 0    feeding supplement, ENSURE ENLIVE, (ENSURE ENLIVE) LIQD Take 237 mLs by mouth 2 (two) times daily between meals. Qty: 237 mL, Refills: 0    mirtazapine (REMERON) 15 MG tablet Take 1 tablet (15 mg total) by mouth at bedtime. Qty: 30 tablet, Refills: 0    potassium chloride (K-DUR) 10 MEQ tablet Take 2 tablets (20 mEq total) by mouth daily. Qty: 4 tablet, Refills: 0      CONTINUE these medications which have CHANGED   Details  amLODipine (NORVASC) 5 MG tablet Take 1 tablet (5 mg total) by mouth daily. Qty: 30 tablet, Refills: 3      CONTINUE these medications which have NOT CHANGED   Details  atenolol (TENORMIN) 50 MG tablet Take 1 tablet (50 mg total) by mouth 2 (two) times daily. Qty: 60 tablet, Refills: 11    HYDROcodone-acetaminophen (NORCO) 7.5-325  MG tablet Take 1 tablet by mouth every 4 (four) hours as needed for moderate pain. Qty: 100 tablet, Refills: 0    metFORMIN (GLUCOPHAGE) 500 MG tablet Take 1 tablet (500 mg total) by mouth 2 (two) times daily. Qty: 180 tablet, Refills: 0   Associated Diagnoses: Type 2 diabetes mellitus with complication, without long-term current use of insulin (HCC)    Multiple Vitamins-Minerals (MULTIVITAMIN WITH MINERALS) tablet Take 1 tablet by mouth daily.    omeprazole (PRILOSEC) 20 MG capsule TAKE 1 CAPSULE EVERY DAY Qty: 90 capsule, Refills: 3   Associated Diagnoses: Gastroesophageal reflux disease without esophagitis    pioglitazone (ACTOS) 30 MG tablet 1 tablet daily Qty: 90 tablet, Refills: 3    simvastatin (ZOCOR) 20 MG tablet Take 1 tablet (20 mg total) by mouth daily at 6 PM. Qty: 90 tablet, Refills: 3    traMADol (ULTRAM) 50 MG tablet Take 50 mg by mouth daily as needed for moderate pain.       STOP taking these medications     dronabinol (MARINOL) 2.5 MG capsule      ibuprofen (ADVIL,MOTRIN) 200 MG tablet      lisinopril (PRINIVIL,ZESTRIL) 40 MG tablet      methylPREDNISolone (MEDROL DOSEPAK) 4 MG TBPK tablet        No Known Allergies Follow-up Information  [name], MD. Schedule an appointment as soon as possible for a visit in 1 week(s).   Specialty:  Family Medicine Why:  f/u in 1-2 weeks. Contact information: [place] [address] [phone]        Eilleen Kempf., MD Follow up.   Specialty:  Oncology Why:  f/u as scheduled. Contact information: [place] [address] [phone]            The results of significant diagnostics from this hospitalization (including imaging, microbiology, ancillary and laboratory) are listed below for reference.    Significant Diagnostic Studies: Dg Chest 2 View  Result Date: 05/27/2016 CLINICAL DATA:  Fever with shortness of breath EXAM: CHEST  2 VIEW  COMPARISON:  May 10, 2016 chest radiograph and PET-CT May 03, 2016 FINDINGS: Lungs are hyperexpanded. There is airspace consolidation in the right lower lobe in the area of known mass. The mass as a discrete structure known to be present in the right lower lobe is not evident by radiography. The lungs elsewhere are clear. No pneumothorax. Heart size is upper normal with pulmonary vascularity within normal limits. No adenopathy is appreciable by radiography. No bone lesions. IMPRESSION: Right lower lobe patchy infiltrate. Question element of hemorrhage from recent biopsy. There may also be superimposed pneumonia in the right base. The known mass in the right lower lobe region is not seen as a discrete structure by radiography. Lungs elsewhere clear. Stable cardiac silhouette. Electronically Signed   By: Lowella Grip III M.D.   On: 05/27/2016 09:47   Mr Jeri Cos EU Contrast  Result Date: 05/27/2016 CLINICAL DATA:  82 y/o  M; metastatic lung cancer. EXAM: MRI HEAD WITHOUT AND WITH CONTRAST TECHNIQUE: Multiplanar, multiecho pulse sequences of the brain and surrounding structures were obtained without and with intravenous contrast. CONTRAST:  64m MULTIHANCE GADOBENATE DIMEGLUMINE 529 MG/ML IV SOLN COMPARISON:  None. FINDINGS: Brain: No diffusion signal abnormality. Moderate brain parenchymal volume loss. Mild chronic microvascular ischemic changes in periventricular white matter and the pons. Left anterior corona radiata small no abnormal susceptibility hypointensity to indicate intracranial hemorrhage. Severe motion artifact of T1 postcontrast sequences. No gross evidence for abnormal enhancement. Chronic infarct. Vascular: Normal flow voids. Skull and upper cervical spine: Normal marrow signal. Left temporal scalp 10 mm cyst is probably sebaceous. Sinuses/Orbits: Negative.  Bilateral intra-ocular lens replacement. Other: None. IMPRESSION: 1. Extensive motion artifact of T1 postcontrast sequences. No  gross evidence for abnormal enhancement. No signal abnormality on other sequences to suggest intracranial metastatic disease. 2. Moderate brain parenchymal volume loss, mild chronic microvascular ischemic changes, and chronic left anterior corona radiata small lacunar infarct. Electronically Signed   By: LKristine GarbeM.D.   On: 05/27/2016 20:49   [place]  Result Date: 05/24/2016 CLINICAL DATA:  Acute kidney injury.  Abnormal labs today. EXAM: RENAL / URINARY TRACT ULTRASOUND COMPLETE COMPARISON:  PET-CT 05/03/2016 FINDINGS: Right Kidney: Length: 11.8 cm. Echogenicity within normal limits. No mass or hydronephrosis visualized. Left Kidney: Length: 12.2 cm. Echogenicity is normal. No hydronephrosis. A simple cyst is identified in the lower pole region measuring 2.9 x 2.6 x 2.3 cm. Bladder: Appears normal for degree of bladder distention. Bilateral ureteral jets are visualized. IMPRESSION: 1. No hydronephrosis. 2. Simple cyst in the lower pole the left kidney. Electronically Signed   By: ENolon NationsM.D.   On: 05/24/2016 18:42   Nm Pet Image Initial (pi) Skull Base To Thigh  Result Date: 05/03/2016 CLINICAL DATA:  Initial treatment  strategy for right pulmonary nodules on recent chest CT performed in the setting of chest pain. Remote history of Hodgkin's lymphoma. Former smoker. EXAM: NUCLEAR MEDICINE PET SKULL BASE TO THIGH TECHNIQUE: 11.7 mCi F-18 FDG was injected intravenously. Full-ring PET imaging was performed from the skull base to thigh after the radiotracer. CT data was obtained and used for attenuation correction and anatomic localization. FASTING BLOOD GLUCOSE:  Value: 177 mg/dl COMPARISON:  04/22/2016 chest CT. FINDINGS: NECK No hypermetabolic lymph nodes in the neck. Relatively symmetric hypermetabolism in the glottis without CT correlate, favor physiologic uptake. Mildly enlarged heterogeneous thyroid gland with no hypermetabolic thyroid nodules. CHEST Left main, left anterior  descending, left circumflex and right coronary atherosclerosis. Atherosclerotic nonaneurysmal thoracic aorta. No pleural effusions. Hypermetabolic posterior right lower lobe 2.4 x 1.5 cm pulmonary nodule (series 8/image 39) with max SUV 5.6, stable in size since 04/22/2016. Right middle lobe 7 mm solid pulmonary nodule (series 8/image 47), below PET resolution, not associated with significant metabolism. Ill-defined 2.6 x 1.2 cm right upper lobe ground-glass nodule (series 8/image 20) with associated low level metabolism (max SUV 2.3). No acute consolidative airspace disease or additional significant pulmonary nodules. Relatively symmetric mild reticulonodular opacities at the lung apices are consistent with pleural-parenchymal scarring. Mild centrilobular emphysema. Hypermetabolic right hilar adenopathy with max SUV 7.9, poorly delineated on the noncontrast CT images. No hypermetabolic axillary, mediastinal or left hilar nodes. ABDOMEN/PELVIS No abnormal hypermetabolic activity within the liver, pancreas, adrenal glands, or spleen. No hypermetabolic lymph nodes in the abdomen or pelvis. Cholecystectomy. Simple 2.7 cm renal cyst in the lateral lower left kidney. Atherosclerotic nonaneurysmal abdominal aorta. SKELETON There is faintly sclerotic hypermetabolic sternal osseous lesion with max SUV 10.7. There are several hypermetabolic osseous lesions in the thoracolumbar spine with associated faint sclerosis on the CT images, for example max SUV 9.1 in the T11 vertebral body and max SUV 9.4 in the right L3 posterior elements. There are hypermetabolic osseous lesions in the bilateral shoulder girdles and right ribs, for example max SUV 7.1 in the right second rib and max SUV 7.1 in the left superior scapula. There are hypermetabolic skeletal foci in the left anterior acetabulum (max SUV 7.9) and medial right iliac bone. Multiple pins are seen in the right femoral neck. IMPRESSION: 1. Hypermetabolic 2.4 cm posterior  right lower lobe pulmonary nodule, most consistent with primary bronchogenic carcinoma. 2. Hypermetabolic right hilar lymphadenopathy, consistent with nodal metastasis. No hypermetabolic mediastinal or contralateral hilar adenopathy. 3. Multifocal hypermetabolic osseous lesions throughout the axial skeleton and bilateral shoulder girdles, most prominent in the sternum and thoracic spine with associated faint sclerosis on the CT images, most consistent with multifocal osseous metastatic disease. 4. Subcentimeter right middle lobe pulmonary nodule, below PET resolution, indeterminate. Ill-defined ground-glass right upper lobe 2.3 cm pulmonary nodule with low level metabolism, cannot exclude indolent adenocarcinoma. Recommend follow-up of these nodules on chest CT in 3-6 months. 5. Additional findings include aortic atherosclerosis, coronary atherosclerosis and mild emphysema. Electronically Signed   By: Ilona Sorrel M.D.   On: 05/03/2016 16:47   Ct Biopsy  Result Date: 05/10/2016 INDICATION: 81 year old with a suspicious nodule in the right lower lobe and multiple bone lesions. Findings are concerning for lung cancer. Tissue diagnosis is needed. EXAM: CT-GUIDED CORE BIOPSY OF RIGHT LOWER LOBE LUNG NODULE MEDICATIONS: None. ANESTHESIA/SEDATION: Moderate (conscious) sedation was employed during this procedure. A total of Versed 1.0 mg and Fentanyl 25 mcg was administered intravenously. Moderate Sedation Time: 15 minutes. The patient's level of consciousness  and vital signs were monitored continuously by radiology nursing throughout the procedure under my direct supervision. FLUOROSCOPY TIME:  None COMPLICATIONS: None immediate. PROCEDURE: Informed written consent was obtained from the patient after a thorough discussion of the procedural risks, benefits and alternatives. All questions were addressed. A timeout was performed prior to the initiation of the procedure. Patient was placed on his right side. CT images  through the chest were obtained. The nodule along the posterior right lower lobe was identified. Overlying skin was cleansed with chlorhexidine and sterile field was created. Skin was anesthetized with 1% lidocaine. A 17 gauge coaxial needle was directed into the pulmonary nodule with CT guidance. Needle position confirmed within the lesion. Two core biopsies were obtained with an 18 gauge core device. Specimens placed in formalin. 77 gauge needle was removed with the BiosSentry tract sealant. 17 gauge needle removed without complication. Bandage placed over the puncture site. FINDINGS: 2.4 cm pleural-based nodule in the right lower lobe. Needle position confirmed within this lesion. No significant pneumothorax or hemorrhage following the core biopsies. IMPRESSION: Successful CT-guided core biopsies of the right lower lobe nodule. Electronically Signed   By: Markus Daft M.D.   On: 05/10/2016 16:23   Dg Chest Port 1 View  Result Date: 05/10/2016 CLINICAL DATA:  Status post RIGHT lung biopsy. EXAM: PORTABLE CHEST 1 VIEW COMPARISON:  Chest radiograph March 25, 2016 FINDINGS: Mildly tortuous aorta associated with hypertension. Cardiac silhouette is normal in size. Similar prominent interstitial markings and increased lung volumes most compatible with COPD. LEFT lung base atelectasis/ scarring. No pleural effusion. No pneumothorax. Soft tissue planes and included osseous structures are nonsuspicious. IMPRESSION: No pneumothorax. COPD.  LEFT lung base atelectasis/scarring. Electronically Signed   By: Elon Alas M.D.   On: 05/10/2016 14:33    Microbiology: Recent Results (from the past 240 hour(s))  MRSA PCR Screening     Status: None   Collection Time: 05/24/16 11:24 PM  Result Value Ref Range Status   MRSA by PCR NEGATIVE NEGATIVE Final    Comment:        The GeneXpert MRSA Assay (FDA approved for NASAL specimens only), is one component of a comprehensive MRSA colonization surveillance  program. It is not intended to diagnose MRSA infection nor to guide or monitor treatment for MRSA infections.   Culture, blood (routine x 2) Call MD if unable to obtain prior to antibiotics being given     Status: None (Preliminary result)   Collection Time: 05/27/16 11:23 AM  Result Value Ref Range Status   Specimen Description BLOOD LEFT ARM  Final   Special Requests IN PEDIATRIC BOTTLE 2CC  Final   Culture NO GROWTH 4 DAYS  Final   Report Status PENDING  Incomplete  Culture, blood (routine x 2) Call MD if unable to obtain prior to antibiotics being given     Status: None (Preliminary result)   Collection Time: 05/27/16 11:28 AM  Result Value Ref Range Status   Specimen Description BLOOD LEFT HAND  Final   Special Requests IN PEDIATRIC BOTTLE 3CC  Final   Culture NO GROWTH 4 DAYS  Final   Report Status PENDING  Incomplete  Urine culture     Status: Abnormal   Collection Time: 05/27/16  1:24 PM  Result Value Ref Range Status   Specimen Description URINE, CLEAN CATCH  Final   Special Requests NONE  Final   Culture >=100,000 COLONIES/mL ESCHERICHIA COLI (A)  Final   Report Status 05/29/2016 FINAL  Final  Organism ID, Bacteria ESCHERICHIA COLI (A)  Final      Susceptibility   Escherichia coli - MIC*    AMPICILLIN <=2 SENSITIVE Sensitive     CEFAZOLIN <=4 SENSITIVE Sensitive     CEFTRIAXONE <=1 SENSITIVE Sensitive     CIPROFLOXACIN <=0.25 SENSITIVE Sensitive     GENTAMICIN <=1 SENSITIVE Sensitive     IMIPENEM <=0.25 SENSITIVE Sensitive     NITROFURANTOIN <=16 SENSITIVE Sensitive     TRIMETH/SULFA <=20 SENSITIVE Sensitive     AMPICILLIN/SULBACTAM <=2 SENSITIVE Sensitive     PIP/TAZO <=4 SENSITIVE Sensitive     Extended ESBL NEGATIVE Sensitive     * >=100,000 COLONIES/mL ESCHERICHIA COLI  Urine culture     Status: None   Collection Time: 05/28/16  2:12 PM  Result Value Ref Range Status   Specimen Description URINE, RANDOM  Final   Special Requests NONE  Final   Culture NO  GROWTH  Final   Report Status 05/29/2016 FINAL  Final     Labs: Basic Metabolic Panel:  Recent Labs Lab 05/27/16 0342 05/28/16 0850 05/29/16 0538 05/30/16 0825 05/31/16 0502  NA 143 140 137 138 141  K 4.3 3.2* 3.4* 3.1* 3.1*  CL 98* 95* 93* 95* 100*  CO2 38* '31 31 31 30  '$ GLUCOSE 185* 150* 155* 154* 148*  BUN 38* 37* 41* 30* 25*  CREATININE 1.48* 1.43* 1.36* 1.22 1.18  CALCIUM 9.1 8.6* 8.4* 8.5* 8.5*   Liver Function Tests: No results for input(s): AST, ALT, ALKPHOS, BILITOT, PROT, ALBUMIN in the last 168 hours. No results for input(s): LIPASE, AMYLASE in the last 168 hours. No results for input(s): AMMONIA in the last 168 hours. CBC:  Recent Labs Lab 05/27/16 0342 05/28/16 0715 05/29/16 0538 05/30/16 0825 05/31/16 0856  WBC 16.1* 17.1* 16.8* 16.3* 21.0*  NEUTROABS  --   --  11.5* 11.0* 14.8*  HGB 12.8* 12.0* 12.3* 11.8* 10.5*  HCT 38.8* 36.1* 36.6* 35.3* 32.3*  MCV 95.3 95.8 95.3 95.9 97.0  PLT 272 242 241 237 313   Cardiac Enzymes: No results for input(s): CKTOTAL, CKMB, CKMBINDEX, TROPONINI in the last 168 hours. BNP: BNP (last 3 results) No results for input(s): BNP in the last 8760 hours.  ProBNP (last 3 results) No results for input(s): PROBNP in the last 8760 hours.  CBG:  Recent Labs Lab 05/30/16 0800 05/30/16 1151 05/30/16 1726 05/30/16 2242 05/31/16 0746  GLUCAP 137* 153* 167* 173* 147*       Signed:  Tatianna Ibbotson MD.  [facility] 05/31/2016, 4:07 PM

## 2016-05-31 NOTE — Progress Notes (Signed)
PROGRESS NOTE    Alexander Landry  VHQ:[id number] DOB: 14-Jan-1933 DOA: 05/24/2016 PCP: [name], MD    Brief Narrative:  Alexander Him Flournoyis a 82 y.o.malewith medical history significant of hypertension, diabetes, acid reflux, kyphosis, history of Hodgkin's lymphoma is status post systemic chemotherapy about 20 years ago, recently diagnosed with metastatic bronchogenic adenocarcinoma, sent from oncology clinic for serum potassium level of 8. Patient was recently complaining of chest pain when CT scan of chest was done which was consistent with poorly nodule. Patient underwent PET scan and CT guided core biopsy of the right lower lung by IR on October 30. The final pathology result came back adenocarcinoma. Patient came to oncology clinic first time today for initiation of treatment. At oncology clinic, the lab work up was done. Potassium level was found to be 8 with creatinine of 3.2. Patient was sent to ED, was evaluated by nephrology and was transferred to [facility] for further workup.  Patient's hyperkalemia improved and renal function trended down. Patient noted to have a fever and leukocytosis workup revealed right lower lobe pneumonia. Patient started empirically on IV vancomycin and IV cefepime. Patient also went to [facility] long hospital for simulation in anticipation of radiation treatments.     Assessment & Plan:   Principal Problem:   Hyperkalemia Active Problems:   Acute kidney injury ([place])   HCAP (healthcare-associated pneumonia)   Diabetes mellitus type 2 with complications ([place])   Hypertension associated with diabetes ([place])   History of Hodgkin's lymphoma   Protein-calorie malnutrition, severe   Hypotension   Lung cancer ([place])   E-coli UTI  #1 severe hyperkalemia in the setting of acute renal failure and ACE inhibitor Improved. Potassium at 3.1 today. EKG did show peaked T waves. Patient received medical management of albuterol, bicarbonate, insulin,  dextrose, IV fluids and Kayexalate. D/C'd sodium bicarbonate drip. Hyperkalemia resolved. Nephrology assessed the patient during this hospitalization. Follow.  #2 acute kidney injury in the setting of severe dehydration, ACE inhibitor, NSAIDs Improving. Creatinine now at 1.18. Saline lock IV fluids. Discontinued bicarbonate drip.  No ACE inhibitor or NSAIDs in the future.Dr Tana Coast discussed with nephrology, Dr Jonnie Finner who had recommended continuing bicarbonate drip for 1-2 days until renal function not completely resolved. Bicarbonate levels increasing and as such discontinued bicarbonate. Follow.  #3 probable healthcare associated pneumonia Patient states has a mild cough. Patient with a MAXIMUM TEMPERATURE of 103 early this morning with a worsening leukocytosis. Chest x-ray from 05/27/2016 showed a right basilar infiltrate. Due to patient's recent diagnosis of metastatic lung cancer will treat as a healthcare associated pneumonia as patient is likely immunocompromised. Blood cultures pending. Urine strep pneumococcus antigen negative. Influenza A PCR negative. HIV nonreactive. Sputum Gram stain and culture pending. Patient was placed empirically on IV vancomycin and IV cefepime. Infectious diseases was consulted due to patient's fever which they felt was likely tumor related. As patient was not hypoxic with no particular signs of pneumonia in including no increased shortness of breath or no increased cough or congestion was recommended per ID that patient be observed off antibiotics and to ambulate to mobilize secretions. Patient noted to have a MAXIMUM TEMPERATURE of 103 overnight. Will have ID reassess.  #4 acute encephalopathy Likely medication induced. Patient with clinical improvement. Discontinued mittens. Marinol and tramadol were discontinued with clinical improvement. MRI of head with and without contrast, with no abnormalities to suggest intracranial metastatic disease. No acute infarct noted  either.  #5 moderate protein calorie malnutrition Nutritional supplementations. Patient  started on Remeron with improved appetite.  #6 diabetes mellitus type 2 CBGs have ranged from 147 - 173. Continue sliding scale insulin. Oral hypoglycemic agents on hold.  #7 metastatic lung cancer Patient recently had biopsy and undergoing treatment evaluation per oncology, Dr. [name] as well as radiation oncologist. MRI of head with and without contrast was done 05/27/2016 with no gross evidence of abnormal enhancement. No signal abnormality to suggest intracranial metastatic disease. Moderate brain parenchymal volume loss, mild chronic microvascular ischemic changes, chronic left anterior corona radiator small lacunar infarct.  Patient underwent simulation for radiation treatment at [facility] long hospital 05/28/2016. Patient scheduled for radiation treatment today 05/31/2016. Outpatient follow-up.  #8 hypotension Secondary to hypovolemia. Improved with IV fluids. Saline lock IV fluids.   #9 hypertension Continue atenolol and Norvasc.  #10 Escherichia coli UTI S/p 3 days IV cefepime.  #11 hypokalemia Replete.  #12 fevers ?? Etiology. Per ID likely related to tumor. Initially felt likely secondary to pneumonia and E Coli uti. Blood cultures pending with no growth to date. Patient received 3 days of empiric IV vancomycin and IV cefepime. IV antibiotics were discontinued. Infectious diseases on 05/29/2016 and recommending to monitor off antibiotics.  Patient noted to have a MAXIMUM TEMPERATURE of 103 overnight. WBC with a worsening leukocytosis. Will check blood cultures 2. Will have ID reassess patient. Appreciate ID input and recommendations.      DVT prophylaxis: SCDs Code Status: Full Family Communication: Updated patient and wife at bedside. Disposition Plan: Home when confusion has improved. Renal function has improved and hyperkalemia resolved. Afebrile for at least 24-48 hours with  improvement with leukocytosis.    Consultants:   Nephrology: Dr.Schertz 05/24/2016  infectious disease: Dr.Comer 05/29/2016  Procedures:   Renal ultrasound 05/24/2016  Chest x-ray 05/27/2016  MRI HEAD 05/27/2016  Antimicrobials:  IV vancomycin 05/27/2016>>>>> 05/29/2016  IV cefepime 05/27/2016>>>>>>> 05/29/2016   Subjective: Patient alert. Sitting up in bed. No SOB. No CP. Patient denies any coughing. Following commands. Patient noted to have a MAXIMUM TEMPERATURE of 103 overnight.   Objective: Vitals:   05/31/16 0610 05/31/16 0613 05/31/16 0826 05/31/16 0827  BP: (!) 125/48  (!) 123/54 (!) 123/54  Pulse: 80   66  Resp: 18     Temp: (!) 100.8 F (38.2 C)     TempSrc: Oral     SpO2: 96%     Weight:  89.2 kg (196 lb 10.4 oz)    Height:        Intake/Output Summary (Last 24 hours) at 05/31/16 1153 Last data filed at 05/31/16 0137  Gross per 24 hour  Intake              500 ml  Output                0 ml  Net              500 ml   Filed Weights   05/29/16 0519 05/30/16 0510 05/31/16 0613  Weight: 90.9 kg (200 lb 6.4 oz) 91.8 kg (202 lb 6.4 oz) 89.2 kg (196 lb 10.4 oz)    Examination:  General exam: Appears calm and comfortable.  Respiratory system: CTAB. Respiratory effort normal. Cardiovascular system: S1 & S2 heard, Irregularly irregular. No JVD, murmurs, rubs, gallops or clicks. No pedal edema. Gastrointestinal system: Abdomen is nondistended, soft and nontender. No organomegaly or masses felt. Normal bowel sounds heard. Central nervous system: Alert and oriented. No focal neurological deficits. Extremities: Symmetric 5 x 5 power. Skin:  No rashes, lesions or ulcers Psychiatry: Judgement and insight appear normal. Mood & affect appropriate.     Data Reviewed: I have personally reviewed following labs and imaging studies  CBC:  Recent Labs Lab 05/24/16 1324  05/27/16 0342 05/28/16 0715 05/29/16 0538 05/30/16 0825 05/31/16 0856  WBC 10.6*  < >  16.1* 17.1* 16.8* 16.3* 21.0*  NEUTROABS 6.5  --   --   --  11.5* 11.0* 14.8*  HGB 13.6  < > 12.8* 12.0* 12.3* 11.8* 10.5*  HCT 40.3  < > 38.8* 36.1* 36.6* 35.3* 32.3*  MCV 95.5  < > 95.3 95.8 95.3 95.9 97.0  PLT 320  < > 272 242 241 237 313  < > = values in this interval not displayed. Basic Metabolic Panel:  Recent Labs Lab 05/27/16 0342 05/28/16 0850 05/29/16 0538 05/30/16 0825 05/31/16 0502  NA 143 140 137 138 141  K 4.3 3.2* 3.4* 3.1* 3.1*  CL 98* 95* 93* 95* 100*  CO2 38* '31 31 31 30  '$ GLUCOSE 185* 150* 155* 154* 148*  BUN 38* 37* 41* 30* 25*  CREATININE 1.48* 1.43* 1.36* 1.22 1.18  CALCIUM 9.1 8.6* 8.4* 8.5* 8.5*   GFR: Estimated Creatinine Clearance: 58.7 mL/min (by C-G formula based on SCr of 1.18 mg/dL). Liver Function Tests:  Recent Labs Lab 05/24/16 1324  AST 8  ALT 13  ALKPHOS 118  BILITOT 0.44  PROT 8.2  ALBUMIN 3.2*   No results for input(s): LIPASE, AMYLASE in the last 168 hours. No results for input(s): AMMONIA in the last 168 hours. Coagulation Profile: No results for input(s): INR, PROTIME in the last 168 hours. Cardiac Enzymes: No results for input(s): CKTOTAL, CKMB, CKMBINDEX, TROPONINI in the last 168 hours. BNP (last 3 results) No results for input(s): PROBNP in the last 8760 hours. HbA1C: No results for input(s): HGBA1C in the last 72 hours. CBG:  Recent Labs Lab 05/30/16 0800 05/30/16 1151 05/30/16 1726 05/30/16 2242 05/31/16 0746  GLUCAP 137* 153* 167* 173* 147*   Lipid Profile: No results for input(s): CHOL, HDL, LDLCALC, TRIG, CHOLHDL, LDLDIRECT in the last 72 hours. Thyroid Function Tests: No results for input(s): TSH, T4TOTAL, FREET4, T3FREE, THYROIDAB in the last 72 hours. Anemia Panel: No results for input(s): VITAMINB12, FOLATE, FERRITIN, TIBC, IRON, RETICCTPCT in the last 72 hours. Sepsis Labs: No results for input(s): PROCALCITON, LATICACIDVEN in the last 168 hours.  Recent Results (from the past 240 hour(s))    MRSA PCR Screening     Status: None   Collection Time: 05/24/16 11:24 PM  Result Value Ref Range Status   MRSA by PCR NEGATIVE NEGATIVE Final    Comment:        The GeneXpert MRSA Assay (FDA approved for NASAL specimens only), is one component of a comprehensive MRSA colonization surveillance program. It is not intended to diagnose MRSA infection nor to guide or monitor treatment for MRSA infections.   Culture, blood (routine x 2) Call MD if unable to obtain prior to antibiotics being given     Status: None (Preliminary result)   Collection Time: 05/27/16 11:23 AM  Result Value Ref Range Status   Specimen Description BLOOD LEFT ARM  Final   Special Requests IN PEDIATRIC BOTTLE 2CC  Final   Culture NO GROWTH 3 DAYS  Final   Report Status PENDING  Incomplete  Culture, blood (routine x 2) Call MD if unable to obtain prior to antibiotics being given     Status: None (Preliminary result)  Collection Time: 05/27/16 11:28 AM  Result Value Ref Range Status   Specimen Description BLOOD LEFT HAND  Final   Special Requests IN PEDIATRIC BOTTLE 3CC  Final   Culture NO GROWTH 3 DAYS  Final   Report Status PENDING  Incomplete  Urine culture     Status: Abnormal   Collection Time: 05/27/16  1:24 PM  Result Value Ref Range Status   Specimen Description URINE, CLEAN CATCH  Final   Special Requests NONE  Final   Culture >=100,000 COLONIES/mL ESCHERICHIA COLI (A)  Final   Report Status 05/29/2016 FINAL  Final   Organism ID, Bacteria ESCHERICHIA COLI (A)  Final      Susceptibility   Escherichia coli - MIC*    AMPICILLIN <=2 SENSITIVE Sensitive     CEFAZOLIN <=4 SENSITIVE Sensitive     CEFTRIAXONE <=1 SENSITIVE Sensitive     CIPROFLOXACIN <=0.25 SENSITIVE Sensitive     GENTAMICIN <=1 SENSITIVE Sensitive     IMIPENEM <=0.25 SENSITIVE Sensitive     NITROFURANTOIN <=16 SENSITIVE Sensitive     TRIMETH/SULFA <=20 SENSITIVE Sensitive     AMPICILLIN/SULBACTAM <=2 SENSITIVE Sensitive      PIP/TAZO <=4 SENSITIVE Sensitive     Extended ESBL NEGATIVE Sensitive     * >=100,000 COLONIES/mL ESCHERICHIA COLI  Urine culture     Status: None   Collection Time: 05/28/16  2:12 PM  Result Value Ref Range Status   Specimen Description URINE, RANDOM  Final   Special Requests NONE  Final   Culture NO GROWTH  Final   Report Status 05/29/2016 FINAL  Final         Radiology Studies: No results found.      Scheduled Meds: . amLODipine  5 mg Oral Daily  . atenolol  50 mg Oral BID  . feeding supplement (ENSURE ENLIVE)  237 mL Oral BID BM  . guaiFENesin  1,200 mg Oral BID  . insulin aspart  0-15 Units Subcutaneous TID WC  . insulin aspart  0-5 Units Subcutaneous QHS  . insulin aspart  3 Units Subcutaneous TID WC  . mirtazapine  15 mg Oral QHS  . multivitamin with minerals  1 tablet Oral Daily  . pantoprazole  40 mg Oral Daily  . potassium chloride  40 mEq Oral Q4H  . simvastatin  20 mg Oral q1800  . sodium chloride flush  3 mL Intravenous Q12H   Continuous Infusions:    LOS: 7 days    Time spent: 40 mins    Rashanna Christiana, MD [facility] Pager [phone]  If 7PM-7AM, please contact night-coverage [email or web address] Password [email or web address] 05/31/2016, 11:53 AM

## 2016-05-31 NOTE — Care Management Important Message (Signed)
Important Message  Patient Details  Name: Alexander Landry MRN: [id number] Date of Birth: 1933/04/17   Medicare Important Message Given:  Yes    Nathen May 05/31/2016, 10:48 AM

## 2016-05-31 NOTE — Consult Note (Addendum)
            [facility] CM Primary Care Navigator  05/31/2016  [place] Jun 19, 1933 [id number]    Seen patient and wife Geni Bers) at the bedside to identify possible discharge needs.  Patient's wife states that pain medication had taken effect so patient had fallen asleep. Wife reports that patient had elevated potassium level that had led to this admission.     Patient's wife confirmed Dr. Jill Alexanders with [place] as theprimary care provider.   Wife states using [facility] ([place]to obtain medications with nodifficulty. Patient's wife reports managing his medications at home.    Patient lives with spouse. He was able to drive prior to admission according to wife. Son Sett[place], Brooke Bonito.) will be providing transportation to his doctors'appointments.    Patient's wife will be the primary caregiver when he returns home and son will assist with care needs as stated. Plan for discharge is home with home health services set-up with [facility] per Inpatient CM. Patient's wheelchair delivered to his room.   Patient's wife voiced understanding to call primary care provider's office when discharged to home, for a post discharge follow-up appointment within a week or sooner if needed. Patient letter provided as a reminder. [facility] care management contact information provided if future needs arise.  Wife appeared worried and tearful, discharge concerns and queries were addressed with patient's RN.  Primary care provider's office called and notified Tye [place]) of patient's discharge and need for post hospital follow-up and transition of care. Made aware of patient's home health services set-up with [facility], DME and refer to [facility] care management if needed.   For additional questions please contact:  Edwena Felty A. Nasreen Goedecke, BSN, RN-BC [facility] PRIMARY CARE Navigator Cell: [phone]

## 2016-05-31 NOTE — Progress Notes (Signed)
PT Cancellation Note  Patient Details Name: Alexander Landry MRN: [id number] DOB: 1932-12-01   Cancelled Treatment:    Reason Eval/Treat Not Completed: Fatigue/lethargy limiting ability to participate (returned from radiation. will ry to see early am prior to radiation)   Claretha Cooper 05/31/2016, 2:53 PM

## 2016-06-01 ENCOUNTER — Other Ambulatory Visit: Payer: Self-pay

## 2016-06-01 ENCOUNTER — Inpatient Hospital Stay
Admission: RE | Admit: 2016-06-01 | Discharge: 2016-06-01 | Disposition: A | Discharge status: Home / Self Care | Payer: Self-pay | Source: Ambulatory Visit | Attending: Radiation Oncology | Admitting: Radiation Oncology

## 2016-06-01 ENCOUNTER — Emergency Department (HOSPITAL_COMMUNITY): Payer: Commercial Managed Care - HMO

## 2016-06-01 ENCOUNTER — Inpatient Hospital Stay (HOSPITAL_COMMUNITY): Payer: Commercial Managed Care - HMO

## 2016-06-01 ENCOUNTER — Ambulatory Visit
Admission: RE | Admit: 2016-06-01 | Discharge: 2016-06-01 | Disposition: A | Discharge status: Home / Self Care | Payer: Commercial Managed Care - HMO | Source: Ambulatory Visit | Attending: Radiation Oncology | Admitting: Radiation Oncology

## 2016-06-01 ENCOUNTER — Telehealth: Payer: Self-pay | Admitting: Family Medicine

## 2016-06-01 ENCOUNTER — Inpatient Hospital Stay (HOSPITAL_COMMUNITY)
Admission: EM | Admit: 2016-06-01 | Discharge: 2016-06-08 | DRG: 871 | Disposition: A | Discharge status: Skilled Nursing Facility | Payer: Commercial Managed Care - HMO | Attending: Nephrology | Admitting: Nephrology

## 2016-06-01 VITALS — BP 87/65 | HR 75 | Temp 98.6°F | Resp 20

## 2016-06-01 DIAGNOSIS — N390 Urinary tract infection, site not specified: Secondary | ICD-10-CM | POA: Diagnosis present

## 2016-06-01 DIAGNOSIS — E876 Hypokalemia: Secondary | ICD-10-CM | POA: Diagnosis present

## 2016-06-01 DIAGNOSIS — N320 Bladder-neck obstruction: Secondary | ICD-10-CM | POA: Diagnosis present

## 2016-06-01 DIAGNOSIS — C3491 Malignant neoplasm of unspecified part of right bronchus or lung: Secondary | ICD-10-CM | POA: Diagnosis present

## 2016-06-01 DIAGNOSIS — G9340 Encephalopathy, unspecified: Secondary | ICD-10-CM | POA: Diagnosis not present

## 2016-06-01 DIAGNOSIS — J690 Pneumonitis due to inhalation of food and vomit: Secondary | ICD-10-CM | POA: Diagnosis present

## 2016-06-01 DIAGNOSIS — R6520 Severe sepsis without septic shock: Secondary | ICD-10-CM | POA: Diagnosis present

## 2016-06-01 DIAGNOSIS — G9341 Metabolic encephalopathy: Secondary | ICD-10-CM | POA: Diagnosis present

## 2016-06-01 DIAGNOSIS — F0390 Unspecified dementia without behavioral disturbance: Secondary | ICD-10-CM | POA: Diagnosis present

## 2016-06-01 DIAGNOSIS — N179 Acute kidney failure, unspecified: Secondary | ICD-10-CM | POA: Diagnosis present

## 2016-06-01 DIAGNOSIS — Z7189 Other specified counseling: Secondary | ICD-10-CM | POA: Diagnosis not present

## 2016-06-01 DIAGNOSIS — J189 Pneumonia, unspecified organism: Secondary | ICD-10-CM

## 2016-06-01 DIAGNOSIS — Z66 Do not resuscitate: Secondary | ICD-10-CM | POA: Diagnosis present

## 2016-06-01 DIAGNOSIS — E43 Unspecified severe protein-calorie malnutrition: Secondary | ICD-10-CM | POA: Diagnosis present

## 2016-06-01 DIAGNOSIS — Z8571 Personal history of Hodgkin lymphoma: Secondary | ICD-10-CM

## 2016-06-01 DIAGNOSIS — E118 Type 2 diabetes mellitus with unspecified complications: Secondary | ICD-10-CM | POA: Diagnosis present

## 2016-06-01 DIAGNOSIS — C7951 Secondary malignant neoplasm of bone: Secondary | ICD-10-CM | POA: Diagnosis present

## 2016-06-01 DIAGNOSIS — R627 Adult failure to thrive: Secondary | ICD-10-CM | POA: Diagnosis present

## 2016-06-01 DIAGNOSIS — I959 Hypotension, unspecified: Secondary | ICD-10-CM | POA: Diagnosis present

## 2016-06-01 DIAGNOSIS — K219 Gastro-esophageal reflux disease without esophagitis: Secondary | ICD-10-CM | POA: Diagnosis present

## 2016-06-01 DIAGNOSIS — R6510 Systemic inflammatory response syndrome (SIRS) of non-infectious origin without acute organ dysfunction: Secondary | ICD-10-CM

## 2016-06-01 DIAGNOSIS — I110 Hypertensive heart disease with heart failure: Secondary | ICD-10-CM | POA: Diagnosis present

## 2016-06-01 DIAGNOSIS — I493 Ventricular premature depolarization: Secondary | ICD-10-CM | POA: Diagnosis present

## 2016-06-01 DIAGNOSIS — M4000 Postural kyphosis, site unspecified: Secondary | ICD-10-CM | POA: Diagnosis present

## 2016-06-01 DIAGNOSIS — E875 Hyperkalemia: Secondary | ICD-10-CM | POA: Diagnosis not present

## 2016-06-01 DIAGNOSIS — B9620 Unspecified Escherichia coli [E. coli] as the cause of diseases classified elsewhere: Secondary | ICD-10-CM | POA: Diagnosis present

## 2016-06-01 DIAGNOSIS — C3431 Malignant neoplasm of lower lobe, right bronchus or lung: Secondary | ICD-10-CM | POA: Diagnosis present

## 2016-06-01 DIAGNOSIS — Z7984 Long term (current) use of oral hypoglycemic drugs: Secondary | ICD-10-CM

## 2016-06-01 DIAGNOSIS — E860 Dehydration: Secondary | ICD-10-CM | POA: Diagnosis present

## 2016-06-01 DIAGNOSIS — Z515 Encounter for palliative care: Secondary | ICD-10-CM | POA: Diagnosis not present

## 2016-06-01 DIAGNOSIS — R197 Diarrhea, unspecified: Secondary | ICD-10-CM | POA: Diagnosis present

## 2016-06-01 DIAGNOSIS — I509 Heart failure, unspecified: Secondary | ICD-10-CM | POA: Diagnosis present

## 2016-06-01 DIAGNOSIS — A419 Sepsis, unspecified organism: Secondary | ICD-10-CM

## 2016-06-01 DIAGNOSIS — N1330 Unspecified hydronephrosis: Secondary | ICD-10-CM | POA: Diagnosis present

## 2016-06-01 DIAGNOSIS — R5383 Other fatigue: Secondary | ICD-10-CM | POA: Diagnosis not present

## 2016-06-01 DIAGNOSIS — Z79899 Other long term (current) drug therapy: Secondary | ICD-10-CM

## 2016-06-01 DIAGNOSIS — Z87891 Personal history of nicotine dependence: Secondary | ICD-10-CM

## 2016-06-01 DIAGNOSIS — Z8051 Family history of malignant neoplasm of kidney: Secondary | ICD-10-CM

## 2016-06-01 DIAGNOSIS — R531 Weakness: Secondary | ICD-10-CM

## 2016-06-01 DIAGNOSIS — Z923 Personal history of irradiation: Secondary | ICD-10-CM

## 2016-06-01 DIAGNOSIS — R0902 Hypoxemia: Secondary | ICD-10-CM | POA: Diagnosis present

## 2016-06-01 LAB — CBC WITH DIFFERENTIAL/PLATELET
BASOS PCT: 0 %
Basophils Absolute: 0.1 10*3/uL (ref 0.0–0.1)
EOS PCT: 2 %
Eosinophils Absolute: 0.4 10*3/uL (ref 0.0–0.7)
HCT: 37.1 % — ABNORMAL LOW (ref 39.0–52.0)
Hemoglobin: 12.1 g/dL — ABNORMAL LOW (ref 13.0–17.0)
Lymphocytes Relative: 15 %
Lymphs Abs: 3.3 10*3/uL (ref 0.7–4.0)
MCH: 31.6 pg (ref 26.0–34.0)
MCHC: 32.6 g/dL (ref 30.0–36.0)
MCV: 96.9 fL (ref 78.0–100.0)
MONO ABS: 2.9 10*3/uL — AB (ref 0.1–1.0)
MONOS PCT: 13 %
Neutro Abs: 16.1 10*3/uL — ABNORMAL HIGH (ref 1.7–7.7)
Neutrophils Relative %: 70 %
PLATELETS: 318 10*3/uL (ref 150–400)
RBC: 3.83 MIL/uL — ABNORMAL LOW (ref 4.22–5.81)
RDW: 14 % (ref 11.5–15.5)
WBC: 22.8 10*3/uL — ABNORMAL HIGH (ref 4.0–10.5)

## 2016-06-01 LAB — I-STAT CG4 LACTIC ACID, ED: Lactic Acid, Venous: 2.89 mmol/L (ref 0.5–1.9)

## 2016-06-01 LAB — COMPREHENSIVE METABOLIC PANEL
ALBUMIN: 2.6 g/dL — AB (ref 3.5–5.0)
ALK PHOS: 123 U/L (ref 38–126)
ALT: 39 U/L (ref 17–63)
AST: 27 U/L (ref 15–41)
Anion gap: 12 (ref 5–15)
BILIRUBIN TOTAL: 0.9 mg/dL (ref 0.3–1.2)
BUN: 48 mg/dL — AB (ref 6–20)
CO2: 30 mmol/L (ref 22–32)
Calcium: 8.9 mg/dL (ref 8.9–10.3)
Chloride: 99 mmol/L — ABNORMAL LOW (ref 101–111)
Creatinine, Ser: 1.59 mg/dL — ABNORMAL HIGH (ref 0.61–1.24)
GFR calc Af Amer: 45 mL/min — ABNORMAL LOW (ref >60)
GFR calc non Af Amer: 39 mL/min — ABNORMAL LOW (ref >60)
GLUCOSE: 155 mg/dL — AB (ref 65–99)
POTASSIUM: 4 mmol/L (ref 3.5–5.1)
SODIUM: 141 mmol/L (ref 135–145)
TOTAL PROTEIN: 6.8 g/dL (ref 6.5–8.1)

## 2016-06-01 LAB — URINALYSIS, ROUTINE W REFLEX MICROSCOPIC
Bilirubin Urine: NEGATIVE
GLUCOSE, UA: NEGATIVE mg/dL
Ketones, ur: NEGATIVE mg/dL
Nitrite: NEGATIVE
PH: 5.5 (ref 5.0–8.0)
PROTEIN: NEGATIVE mg/dL
Specific Gravity, Urine: 1.016 (ref 1.005–1.030)

## 2016-06-01 LAB — LACTIC ACID, PLASMA
Lactic Acid, Venous: 2.1 mmol/L (ref 0.5–1.9)
Lactic Acid, Venous: 1.6 mmol/L (ref 0.5–1.9)

## 2016-06-01 LAB — CULTURE, BLOOD (ROUTINE X 2)
Culture: NO GROWTH
Culture: NO GROWTH

## 2016-06-01 LAB — PROCALCITONIN: Procalcitonin: 0.43 ng/mL

## 2016-06-01 LAB — URINE MICROSCOPIC-ADD ON

## 2016-06-01 LAB — CBG MONITORING, ED: Glucose-Capillary: 147 mg/dL — ABNORMAL HIGH (ref 65–99)

## 2016-06-01 LAB — GLUCOSE, CAPILLARY: GLUCOSE-CAPILLARY: 130 mg/dL — AB (ref 65–99)

## 2016-06-01 MED ORDER — ADULT MULTIVITAMIN W/MINERALS CH
1.0000 | ORAL_TABLET | Freq: Every day | ORAL | Status: DC
  Administered 2016-06-02 – 2016-06-08 (×5): 1 via ORAL
  Filled 2016-06-01 (×7): qty 1

## 2016-06-01 MED ORDER — HYDROCODONE-ACETAMINOPHEN 7.5-325 MG PO TABS
1.0000 | ORAL_TABLET | ORAL | Status: DC | PRN
  Administered 2016-06-01 – 2016-06-07 (×11): 1 via ORAL
  Filled 2016-06-01 (×12): qty 1

## 2016-06-01 MED ORDER — ONDANSETRON HCL 4 MG/2ML IJ SOLN: 4.0000 mg | Freq: Four times a day (QID) | INTRAMUSCULAR | Status: DC | PRN

## 2016-06-01 MED ORDER — VANCOMYCIN 50 MG/ML ORAL SOLUTION
125.0000 mg | Freq: Four times a day (QID) | ORAL | Status: DC
  Administered 2016-06-01 – 2016-06-04 (×10): 125 mg via ORAL
  Filled 2016-06-01 (×12): qty 2.5

## 2016-06-01 MED ORDER — RADIAPLEXRX EX GEL
Freq: Once | CUTANEOUS | Status: AC
  Administered 2016-06-01: 14:00:00 via TOPICAL

## 2016-06-01 MED ORDER — SODIUM CHLORIDE 0.9 % IV BOLUS (SEPSIS)
1000.0000 mL | Freq: Once | INTRAVENOUS | Status: AC
  Administered 2016-06-01: 1000 mL via INTRAVENOUS

## 2016-06-01 MED ORDER — SIMVASTATIN 20 MG PO TABS
20.0000 mg | ORAL_TABLET | Freq: Every day | ORAL | Status: DC
  Administered 2016-06-02 – 2016-06-07 (×4): 20 mg via ORAL
  Filled 2016-06-01 (×4): qty 1

## 2016-06-01 MED ORDER — DRONABINOL 2.5 MG PO CAPS
2.5000 mg | ORAL_CAPSULE | Freq: Two times a day (BID) | ORAL | Status: DC
  Administered 2016-06-01 – 2016-06-08 (×11): 2.5 mg via ORAL
  Filled 2016-06-01 (×11): qty 1

## 2016-06-01 MED ORDER — HEPARIN SODIUM (PORCINE) 5000 UNIT/ML IJ SOLN
5000.0000 [IU] | Freq: Three times a day (TID) | INTRAMUSCULAR | Status: DC
  Administered 2016-06-01 – 2016-06-08 (×18): 5000 [IU] via SUBCUTANEOUS
  Filled 2016-06-01 (×19): qty 1

## 2016-06-01 MED ORDER — ENSURE ENLIVE PO LIQD
237.0000 mL | Freq: Two times a day (BID) | ORAL | Status: DC
  Administered 2016-06-02: 237 mL via ORAL

## 2016-06-01 MED ORDER — INSULIN ASPART 100 UNIT/ML ~~LOC~~ SOLN
0.0000 [IU] | Freq: Three times a day (TID) | SUBCUTANEOUS | Status: DC
Start: 2016-06-02 — End: 2016-06-08
  Administered 2016-06-02: 1 [IU] via SUBCUTANEOUS
  Administered 2016-06-02 – 2016-06-03 (×3): 2 [IU] via SUBCUTANEOUS
  Administered 2016-06-03: 1 [IU] via SUBCUTANEOUS
  Administered 2016-06-03: 2 [IU] via SUBCUTANEOUS
  Administered 2016-06-04 – 2016-06-06 (×4): 1 [IU] via SUBCUTANEOUS
  Administered 2016-06-06: 2 [IU] via SUBCUTANEOUS
  Administered 2016-06-07 – 2016-06-08 (×2): 1 [IU] via SUBCUTANEOUS

## 2016-06-01 MED ORDER — SODIUM CHLORIDE 0.9 % IV SOLN
INTRAVENOUS | Status: DC
  Administered 2016-06-01 – 2016-06-06 (×8): via INTRAVENOUS

## 2016-06-01 MED ORDER — ALBUTEROL SULFATE (2.5 MG/3ML) 0.083% IN NEBU: 2.5000 mg | INHALATION_SOLUTION | RESPIRATORY_TRACT | Status: DC | PRN

## 2016-06-01 MED ORDER — MIRTAZAPINE 15 MG PO TABS
15.0000 mg | ORAL_TABLET | Freq: Every day | ORAL | Status: DC
  Administered 2016-06-01 – 2016-06-07 (×6): 15 mg via ORAL
  Filled 2016-06-01 (×6): qty 1

## 2016-06-01 MED ORDER — HYDROCODONE-ACETAMINOPHEN 7.5-325 MG PO TABS: 1.0000 | ORAL_TABLET | ORAL | Status: DC | PRN

## 2016-06-01 MED ORDER — ONDANSETRON HCL 4 MG PO TABS: 4.0000 mg | ORAL_TABLET | Freq: Four times a day (QID) | ORAL | Status: DC | PRN

## 2016-06-01 MED ORDER — ACETAMINOPHEN 500 MG PO TABS
500.0000 mg | ORAL_TABLET | ORAL | Status: DC | PRN
  Filled 2016-06-01: qty 1

## 2016-06-01 MED ORDER — POTASSIUM CHLORIDE CRYS ER 10 MEQ PO TBCR
20.0000 meq | EXTENDED_RELEASE_TABLET | Freq: Every day | ORAL | Status: DC
  Administered 2016-06-02 – 2016-06-08 (×4): 20 meq via ORAL
  Filled 2016-06-01 (×12): qty 2

## 2016-06-01 NOTE — ED Triage Notes (Signed)
Pt brought in from cancer center, he was there for scheduled radiation therapy, however he was too weak, unable to stand, had 2 episodes of explosive diarrhea. Cancer center nurse sts pt was seen at [facility] ED for same yesterday, she sts she spoke to cancer center social worker and they think he needs a placement.

## 2016-06-01 NOTE — ED Notes (Signed)
Unable to collect labs PA is in room talking with patient

## 2016-06-01 NOTE — ED Provider Notes (Signed)
[place] DEPT Provider Note   CSN: [id number] Arrival date & time: 06/01/16  1404     History   Chief Complaint Chief Complaint  Patient presents with  . Weakness  . Diarrhea    HPI YIGIT NORKUS is a 82 y.o. male.  HPI   CORINTHIAN MIZRAHI is a 82 y.o. male, with a history of Lung CA, HTN, DM, presenting to the ED with weakness, agitation, and confusion over the last couple weeks, worse today.   Completed radiation treatment today, also had one yesterday. Had two episodes of diarrhea today. Too weak to stand. Weakness is not new and has been present for at least 2 weeks. Was able to use a walker until a week ago. Patient is confused as to where he is and also talks about people who have been dead for years as if they just died.  Accompanied by his wife and son, Tayari Yankee. Family inquires about placement in a rehab facility. Lives at home with his wife. Patient has just been getting weaker. Poor oral intake. Patient has no appetite.   Patient endorses previous abdominal cramping, but this resolved following his bouts of diarrhea.  Lung adenocarcinoma with bone mets. Radiation therapy. Sees Dr. Inda Merlin. Seen on November 13 for hyperkalemia, dehydration, and AKI. He was admitted to [facility] stepdown and discharged yesterday.  Denies fever/chills, N/V, hematochezia/melena, recent falls/trauma, increased chest pain or shortness of breath above baseline, or any other complaints.   Past Medical History:  Diagnosis Date  . Adenocarcinoma of right lung, stage 4 ([place]) 05/24/2016  . Diabetes mellitus without complication ([place])   . GERD (gastroesophageal reflux disease)   . Hypertension   . Kyphosis (acquired) ([place])   . Lung cancer ([place]) 05/10/2016   right lower lobe lung=adenocarcinoma  . Medical history non-contributory     Patient Active Problem List   Diagnosis Date Noted  . AKI (acute kidney injury) ([place]) 06/01/2016  . Fever   . E-coli UTI 05/29/2016  . HCAP  (healthcare-associated pneumonia) 05/27/2016  . Bone metastases ([place]) 05/27/2016  . Lung cancer ([place])   . Hypotension   . Protein-calorie malnutrition, severe 05/25/2016  . Adenocarcinoma of right lung, stage 4 ([place]) 05/24/2016  . Hyperkalemia 05/24/2016  . Acute kidney injury ([place]) 05/24/2016  . Acute renal failure ([place])   . Hodgkin's lymphoma ([place])   . Lung nodules 04/23/2016  . Sternal pain 03/25/2016  . Chest pain 03/25/2016  . Spine deformity 03/25/2016  . Need for prophylactic vaccination and inoculation against influenza 03/25/2016  . Onychomycosis 10/23/2015  . Presbycusis of both ears 11/01/2014  . Diabetes mellitus type 2 with complications ([place]) 50/93/2671  . Hypertension associated with diabetes ([place]) 03/29/2013  . Hyperlipidemia with target LDL less than 70 03/29/2013  . History of Hodgkin's lymphoma 03/29/2013  . History of renal stone 03/29/2013    No past surgical history on file.     Home Medications    Prior to Admission medications   Medication Sig Start Date End Date Taking? Authorizing Provider  acetaminophen (TYLENOL) 500 MG tablet Take 1 tablet (500 mg total) by mouth every 4 (four) hours as needed for fever, headache or mild pain. 05/31/16  Yes Eugenie Filler, MD  atenolol (TENORMIN) 50 MG tablet Take 1 tablet (50 mg total) by mouth 2 (two) times daily. 04/12/16  Yes Denita Lung, MD  dronabinol (MARINOL) 2.5 MG capsule Take 2.5 mg by mouth 2 (two) times daily. 05/26/16  Yes Historical Provider, MD  feeding supplement, ENSURE ENLIVE, (ENSURE ENLIVE) LIQD Take 237 mLs by mouth 2 (two) times daily between meals. 06/01/16  Yes Eugenie Filler, MD  HYDROcodone-acetaminophen (NORCO) 7.5-325 MG tablet Take 1 tablet by mouth every 4 (four) hours as needed for moderate pain. 05/13/16  Yes Denita Lung, MD  metFORMIN (GLUCOPHAGE) 500 MG tablet Take 1 tablet (500 mg total) by mouth 2 (two) times daily. 04/05/16  Yes Denita Lung, MD  omeprazole (PRILOSEC)  20 MG capsule TAKE 1 CAPSULE EVERY DAY 09/16/15  Yes Denita Lung, MD  pioglitazone (ACTOS) 30 MG tablet 1 tablet daily 09/16/15  Yes Denita Lung, MD  simvastatin (ZOCOR) 20 MG tablet Take 1 tablet (20 mg total) by mouth daily at 6 PM. 08/25/15  Yes Denita Lung, MD  amLODipine (NORVASC) 5 MG tablet Take 1 tablet (5 mg total) by mouth daily. 06/01/16   Eugenie Filler, MD  amoxicillin (AMOXIL) 500 MG tablet Take 1 tablet (500 mg total) by mouth 2 (two) times daily. 05/31/16 06/04/16  Eugenie Filler, MD  hyaluronate sodium (RADIAPLEXRX) GEL Apply 1 application topically 2 (two) times daily. Apply to b/l shoulders and sternum daily after radiation and bedtime 06/01/16   Hayden Pedro, PA-C  mirtazapine (REMERON) 15 MG tablet Take 1 tablet (15 mg total) by mouth at bedtime. 05/31/16   Eugenie Filler, MD  Multiple Vitamins-Minerals (MULTIVITAMIN WITH MINERALS) tablet Take 1 tablet by mouth daily.    Historical Provider, MD  potassium chloride (K-DUR) 10 MEQ tablet Take 2 tablets (20 mEq total) by mouth daily. 05/31/16 06/02/16  Eugenie Filler, MD    Family History Family History  Problem Relation Age of Onset  . Kidney cancer Mother     Social History Social History  Substance Use Topics  . Smoking status: Former Smoker    Packs/day: 1.00    Years: 40.00    Types: Cigarettes    Quit date: 08/24/1985  . Smokeless tobacco: Never Used  . Alcohol use No     Allergies   Patient has no known allergies.   Review of Systems Review of Systems  Constitutional: Negative for chills, diaphoresis and fever.  Respiratory: Negative for shortness of breath.   Cardiovascular: Negative for chest pain.  Gastrointestinal: Positive for abdominal pain (resolved) and diarrhea. Negative for blood in stool, nausea and vomiting.  Genitourinary: Negative for dysuria and hematuria.  Neurological: Positive for weakness (generalized). Negative for seizures.  All other systems reviewed and  are negative.    Physical Exam Updated Vital Signs BP 91/56   Pulse 89   Temp 98.1 F (36.7 C) (Oral)   Resp 22   SpO2 91%   Physical Exam  Constitutional: He appears well-developed and well-nourished. No distress.  HENT:  Head: Normocephalic and atraumatic.  Eyes: Conjunctivae are normal.  Neck: Neck supple.  Cardiovascular: Normal rate, regular rhythm, normal heart sounds and intact distal pulses.   Pulmonary/Chest: Effort normal and breath sounds normal. No respiratory distress.  Abdominal: Soft. There is no tenderness. There is no guarding.  Musculoskeletal: He exhibits no edema.  Lymphadenopathy:    He has no cervical adenopathy.  Neurological: He is alert.  Patient is oriented to person, but not to place, situation, or date. No sensory deficits. Strength 5/5 in all extremities. Coordination intact. Cranial nerves III-XII grossly intact. No facial droop.   Skin: Skin is warm and dry. He is not diaphoretic.  Psychiatric: He has a normal mood and affect.  His behavior is normal.  Nursing note and vitals reviewed.    ED Treatments / Results  Labs (all labs ordered are listed, but only abnormal results are displayed) Labs Reviewed  CBC WITH DIFFERENTIAL/PLATELET - Abnormal; Notable for the following:       Result Value   WBC 22.8 (*)    RBC 3.83 (*)    Hemoglobin 12.1 (*)    HCT 37.1 (*)    Neutro Abs 16.1 (*)    Monocytes Absolute 2.9 (*)    All other components within normal limits  COMPREHENSIVE METABOLIC PANEL - Abnormal; Notable for the following:    Chloride 99 (*)    Glucose, Bld 155 (*)    BUN 48 (*)    Creatinine, Ser 1.59 (*)    Albumin 2.6 (*)    GFR calc non Af Amer 39 (*)    GFR calc Af Amer 45 (*)    All other components within normal limits  CBG MONITORING, ED - Abnormal; Notable for the following:    Glucose-Capillary 147 (*)    All other components within normal limits  I-STAT CG4 LACTIC ACID, ED - Abnormal; Notable for the following:     Lactic Acid, Venous 2.89 (*)    All other components within normal limits  URINE CULTURE  GASTROINTESTINAL PANEL BY PCR, STOOL (REPLACES STOOL CULTURE)  CULTURE, BLOOD (ROUTINE X 2)  CULTURE, BLOOD (ROUTINE X 2)  C DIFFICILE QUICK SCREEN W PCR REFLEX  URINALYSIS, ROUTINE W REFLEX MICROSCOPIC (NOT AT [facility])  LACTIC ACID, PLASMA  LACTIC ACID, PLASMA  PROCALCITONIN  APTT   BUN  Date Value Ref Range Status  06/01/2016 48 (H) 6 - 20 mg/dL Final  05/31/2016 25 (H) 6 - 20 mg/dL Final  05/30/2016 30 (H) 6 - 20 mg/dL Final  05/29/2016 41 (H) 6 - 20 mg/dL Final  05/24/2016 75.9 (H) 7.0 - 26.0 mg/dL Final   Creatinine  Date Value Ref Range Status  05/24/2016 3.2 (HH) 0.7 - 1.3 mg/dL Final   Creat  Date Value Ref Range Status  02/11/2016 0.91 0.70 - 1.11 mg/dL Final    Comment:      For patients > or = 80 years of age: The upper reference limit for Creatinine is approximately 13% higher for people identified as African-American.     10/23/2015 1.02 0.70 - 1.11 mg/dL Final  11/01/2014 0.88 0.50 - 1.35 mg/dL Final  10/30/2013 0.98 0.50 - 1.35 mg/dL Final   Creatinine, Ser  Date Value Ref Range Status  06/01/2016 1.59 (H) 0.61 - 1.24 mg/dL Final  05/31/2016 1.18 0.61 - 1.24 mg/dL Final  05/30/2016 1.22 0.61 - 1.24 mg/dL Final  05/29/2016 1.36 (H) 0.61 - 1.24 mg/dL Final   WBC  Date Value Ref Range Status  06/01/2016 22.8 (H) 4.0 - 10.5 K/uL Final  05/31/2016 21.0 (H) 4.0 - 10.5 K/uL Final  05/30/2016 16.3 (H) 4.0 - 10.5 K/uL Final  05/29/2016 16.8 (H) 4.0 - 10.5 K/uL Final    EKG  EKG Interpretation None       Radiology Dg Chest 2 View  Result Date: 06/01/2016 CLINICAL DATA:  Confusion and weakness. History of stage IV right lower lobe lung adenocarcinoma. EXAM: CHEST  2 VIEW COMPARISON:  05/27/2016 FINDINGS: Chronic lung disease again noted. Right perihilar opacity partly corresponds to known pulmonary mass. There may be a component of adjacent atelectasis or  pneumonia. No pulmonary edema or pleural fluid identified. No pneumothorax. The heart size is stable and normal.  IMPRESSION: Right perihilar tumor with potential adjacent atelectasis or pneumonia. Electronically Signed   By: Aletta Edouard M.D.   On: 06/01/2016 16:09    Procedures Procedures (including critical care time)  Medications Ordered in ED Medications  sodium chloride 0.9 % bolus 1,000 mL (1,000 mLs Intravenous New Bag/Given 06/01/16 1800)  HYDROcodone-acetaminophen (NORCO) 7.5-325 MG per tablet 1 tablet (1 tablet Oral Given 06/01/16 1713)  insulin aspart (novoLOG) injection 0-9 Units (not administered)  sodium chloride 0.9 % bolus 1,000 mL (0 mLs Intravenous Stopped 06/01/16 1801)     Initial Impression / Assessment and Plan / ED Course  I have reviewed the triage vital signs and the nursing notes.  Pertinent labs & imaging results that were available during my care of the patient were reviewed by me and considered in my medical decision making (see chart for details).  Clinical Course     Patient with a history of lung cancer presents with weakness and increased fatigue worse today. Patient with AKI and increased BUN. Likely dehydration. Social work consult also placed to assist patient with follow up care and possible rehab placement. Admission due to weakness, diarrhea, AKI, dehydration.   5:56 PM Spoke with Dr. Waldron Labs, hospitalist, who agreed to admit the patient to step down unit.   Findings and plan of care discussed with Lacretia Leigh, MD. Dr. Zenia Resides personally evaluated and examined this patient.  Vitals:   06/01/16 1417 06/01/16 1418 06/01/16 1538  BP: 91/56  116/63  Pulse: 89  91  Resp: 22  24  Temp: 98.1 F (36.7 C)    TempSrc: Oral    SpO2: (!) 86% 91% 96%     Final Clinical Impressions(s) / ED Diagnoses   Final diagnoses:  Weakness  Dehydration  AKI (acute kidney injury) [facility])    New Prescriptions New Prescriptions   No medications on file      Lorayne Bender, PA-C 06/01/16 1804

## 2016-06-01 NOTE — Telephone Encounter (Signed)
Called pt's wife for TOC. She states that he has no strength and is very disoriented. She states she can't believe the hospital let him go in the condition is in. I questioned her about home health and she stated they are to call her tomorrow to make arrangements for first visit. She seemed very frazzled and when asked to make an appt I couldn't get her to commit to one. I finally told her due to his condition that I was going to hold an appt for him tomorrow. She stated she would call me back and confirm. Before we could discuss any other issues she stated she had to go.

## 2016-06-01 NOTE — ED Notes (Signed)
Bed: DH74 Expected date:  Expected time:  Means of arrival:  Comments: Cancer center pt

## 2016-06-01 NOTE — Clinical Social Work Note (Signed)
Clinical Social Work Assessment  Patient Details  Name: Alexander Landry MRN: [id number] Date of Birth: 11/25/1932  Date of referral:  06/01/16               Reason for consult:  Discharge Planning                Permission sought to share information with:  Chartered certified accountant granted to share information::  Yes, Verbal Permission Granted  Name::        Agency::     Relationship::     Contact Information:     Housing/Transportation Living arrangements for the past 2 months:  Single Family Home Source of Information:  Patient, Spouse Acupuncturist) Patient Interpreter Needed:  None Criminal Activity/Legal Involvement Pertinent to Current Situation/Hospitalization:    Significant Relationships:  Spouse, Adult Children Geni Bers) Lives with:  Spouse Do you feel safe going back to the place where you live?  No Need for family participation in patient care:  Yes (Comment)  Care giving concerns:  CSW received consult to speak with patient regarding discharge plans.    Social Worker assessment / plan:  CSW spoke with patient, patients spouse Geni Bers), and son via bedside. Patient was recently discharged and has been experiencing weakness in his legs. Patient was at the [facility]/21 before coming to the ER. Patient and family would like short-term rehab before patient returns home. Patient and family were unfamiliar with SNF's in the area and did not have a preference.   Employment status:  Retired Nurse, adult PT Recommendations:  Not assessed at this time Information / Referral to community resources:     Patient/Family's Response to care:  Unknown at this time.   Patient/Family's Understanding of and Emotional Response to Diagnosis, Current Treatment, and Prognosis:  Unknown at this time.   Emotional Assessment Appearance:  Appears stated age Attitude/Demeanor/Rapport:    Affect (typically observed):   Pleasant, Accepting, Calm Orientation:  Oriented to Self, Oriented to Place, Oriented to  Time, Oriented to Situation Alcohol / Substance use:    Psych involvement (Current and /or in the community):  No (Comment)  Discharge Needs  Concerns to be addressed:  Discharge Planning Concerns Readmission within the last 30 days:  Yes Current discharge risk:  None Barriers to Discharge:  No Barriers Identified   Weston Anna, LCSW 06/01/2016, 5:28 PM

## 2016-06-01 NOTE — H&P (Signed)
TRH H&P   Patient Demographics:    Alexander Landry, is a 82 y.o. male  MRN: [id number]   DOB - [birth year]  Admit Date - 06/01/2016  Outpatient Primary MD for the patient is [name], MD  Referring MD/NP/PA: PA Joy  Outpatient Specialists: oncology [name]  Patient coming from: Home  Chief Complaint  Patient presents with  . Weakness  . Diarrhea      HPI:    Alexander Landry  is a 82 y.o. 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,discharge yesterday from [facility] and due to acute renal failure, and pneumonia , continues to spike fever, felt secondary to cancer, had leukocytosis on discharge as well , patient started palliative radiation during that visit , he presents from radiation center today after finishing his radiation treatment for generalized weakness, failure to thrive, and diarrhea , patient reports that he developed today, he had 2 watery bowel movements during treatment session, he denies any chest pain, abdominal pain, coffee-ground emesis, nausea or vomiting, bright red blood per rectum or melena , he reports fever, chills. - in ED workup significant for acute renal failure with creatinine of 1.59, was 1.18 on discharge yesterday , worsening leukocytosis of 22.8 today, was 21 on discharge yesterday, lactic acid of 2.89, chest x-ray significant for cancer and possible pneumonia, urinalysis pending , initially hypotensive and responded to fluid bolus .    Review of systems:    In addition to the HPI above,  Post fever and chills, No Headache, No changes with Vision or hearing, No problems swallowing food or Liquids, No Chest pain, Cough or  Shortness of Breath, No Abdominal pain, No Nausea or Vommitting, reports diarrhea No Blood in stool or Urine,  Dysuria, but reports difficulty urinating No new skin rashes or bruises, No new joints pains-aches,  No new weakness, tingling, numbness in any extremity, particularly last weakness and fatigue No recent weight gain or loss, post progressive weight loss, poor appetite No polyuria, polydypsia or polyphagia, No significant Mental Stressors.  A full 10 point Review of Systems was done, except as stated above, all other Review of Systems were negative.   With Past History of the following :    Past Medical History:  Diagnosis Date  . Adenocarcinoma of right lung, stage 4 ([place]) 05/24/2016  . Diabetes mellitus without complication ([place])   . GERD (gastroesophageal reflux disease)   . Hypertension   . Kyphosis (acquired) ([place])   . Lung cancer ([place]) 05/10/2016   right lower lobe lung=adenocarcinoma  . Medical history non-contributory       No past surgical history on file.    Social History:     Social History  Substance Use Topics  . Smoking status: Former Smoker  Packs/day: 1.00    Years: 40.00    Types: Cigarettes    Quit date: 08/24/1985  . Smokeless tobacco: Never Used  . Alcohol use No     Lives - At home  Mobility - With assistance     Family History :     Family History  Problem Relation Age of Onset  . Kidney cancer Mother      Home Medications:   Prior to Admission medications   Medication Sig Start Date End Date Taking? Authorizing Provider  acetaminophen (TYLENOL) 500 MG tablet Take 1 tablet (500 mg total) by mouth every 4 (four) hours as needed for fever, headache or mild pain. 05/31/16  Yes Eugenie Filler, MD  atenolol (TENORMIN) 50 MG tablet Take 1 tablet (50 mg total) by mouth 2 (two) times daily. 04/12/16  Yes Denita Lung, MD  dronabinol (MARINOL) 2.5 MG capsule Take 2.5 mg by mouth 2 (two) times daily. 05/26/16  Yes  Historical Provider, MD  feeding supplement, ENSURE ENLIVE, (ENSURE ENLIVE) LIQD Take 237 mLs by mouth 2 (two) times daily between meals. 06/01/16  Yes Eugenie Filler, MD  HYDROcodone-acetaminophen (NORCO) 7.5-325 MG tablet Take 1 tablet by mouth every 4 (four) hours as needed for moderate pain. 05/13/16  Yes Denita Lung, MD  metFORMIN (GLUCOPHAGE) 500 MG tablet Take 1 tablet (500 mg total) by mouth 2 (two) times daily. 04/05/16  Yes Denita Lung, MD  omeprazole (PRILOSEC) 20 MG capsule TAKE 1 CAPSULE EVERY DAY 09/16/15  Yes Denita Lung, MD  pioglitazone (ACTOS) 30 MG tablet 1 tablet daily 09/16/15  Yes Denita Lung, MD  simvastatin (ZOCOR) 20 MG tablet Take 1 tablet (20 mg total) by mouth daily at 6 PM. 08/25/15  Yes Denita Lung, MD  amLODipine (NORVASC) 5 MG tablet Take 1 tablet (5 mg total) by mouth daily. 06/01/16   Eugenie Filler, MD  amoxicillin (AMOXIL) 500 MG tablet Take 1 tablet (500 mg total) by mouth 2 (two) times daily. 05/31/16 06/04/16  Eugenie Filler, MD  hyaluronate sodium (RADIAPLEXRX) GEL Apply 1 application topically 2 (two) times daily. Apply to b/l shoulders and sternum daily after radiation and bedtime 06/01/16   Hayden Pedro, PA-C  mirtazapine (REMERON) 15 MG tablet Take 1 tablet (15 mg total) by mouth at bedtime. 05/31/16   Eugenie Filler, MD  Multiple Vitamins-Minerals (MULTIVITAMIN WITH MINERALS) tablet Take 1 tablet by mouth daily.    Historical Provider, MD  potassium chloride (K-DUR) 10 MEQ tablet Take 2 tablets (20 mEq total) by mouth daily. 05/31/16 06/02/16  Eugenie Filler, MD     Allergies:    No Known Allergies   Physical Exam:   Vitals  Blood pressure 116/63, pulse 91, temperature 98.1 F (36.7 C), temperature source Oral, resp. rate 24, SpO2 96 %.   1. General Frail, chronically ill appearing lying in bed in NAD,    2. Awake Alert, Oriented X 3.  3. No F.N deficits, ALL C.Nerves Intact, Strength 5/5 all 4 extremities,  Sensation intact all 4 extremities, Plantars down going.  4. Ears and Eyes appear Normal, Conjunctivae clear, PERRLA. dry Oral Mucosa.  5. Supple Neck, No JVD, No Carotid Bruits.  6. Symmetrical Chest wall movement, Good air movement bilaterally, CTAB.  7. RRR, No Gallops, Rubs or Murmurs, No Parasternal Heave.  8. Positive Bowel Sounds, Abdomen Soft, No tenderness, No organomegaly appriciated,No rebound -guarding or rigidity.  9.  No Cyanosis, Delayed Skin Turgor,  No Skin Rash or Bruise.  10. Significant muscle wasting,  joints appear normal , no effusions, Normal ROM.      Data Review:    CBC  Recent Labs Lab 05/28/16 0715 05/29/16 0538 05/30/16 0825 05/31/16 0856 06/01/16 1548  WBC 17.1* 16.8* 16.3* 21.0* 22.8*  HGB 12.0* 12.3* 11.8* 10.5* 12.1*  HCT 36.1* 36.6* 35.3* 32.3* 37.1*  PLT 242 241 237 313 318  MCV 95.8 95.3 95.9 97.0 96.9  MCH 31.8 32.0 32.1 31.5 31.6  MCHC 33.2 33.6 33.4 32.5 32.6  RDW 14.0 13.7 13.6 14.3 14.0  LYMPHSABS  --  2.7 2.5 3.0 3.3  MONOABS  --  2.1* 2.2* 2.7* 2.9*  EOSABS  --  0.4 0.5 0.4 0.4  BASOSABS  --  0.0 0.1 0.1 0.1   ------------------------------------------------------------------------------------------------------------------  Chemistries   Recent Labs Lab 05/28/16 0850 05/29/16 0538 05/30/16 0825 05/31/16 0502 06/01/16 1548  NA 140 137 138 141 141  K 3.2* 3.4* 3.1* 3.1* 4.0  CL 95* 93* 95* 100* 99*  CO2 '31 31 31 30 30  '$ GLUCOSE 150* 155* 154* 148* 155*  BUN 37* 41* 30* 25* 48*  CREATININE 1.43* 1.36* 1.22 1.18 1.59*  CALCIUM 8.6* 8.4* 8.5* 8.5* 8.9  AST  --   --   --   --  27  ALT  --   --   --   --  39  ALKPHOS  --   --   --   --  123  BILITOT  --   --   --   --  0.9   ------------------------------------------------------------------------------------------------------------------ estimated creatinine clearance is 43.5 mL/min (by C-G formula based on SCr of 1.59 mg/dL  (H)). ------------------------------------------------------------------------------------------------------------------ No results for input(s): TSH, T4TOTAL, T3FREE, THYROIDAB in the last 72 hours.  Invalid input(s): FREET3  Coagulation profile No results for input(s): INR, PROTIME in the last 168 hours. ------------------------------------------------------------------------------------------------------------------- No results for input(s): DDIMER in the last 72 hours. -------------------------------------------------------------------------------------------------------------------  Cardiac Enzymes No results for input(s): CKMB, TROPONINI, MYOGLOBIN in the last 168 hours.  Invalid input(s): CK ------------------------------------------------------------------------------------------------------------------ No results found for: BNP   ---------------------------------------------------------------------------------------------------------------  Urinalysis    Component Value Date/Time   COLORURINE YELLOW 05/28/2016 1412   APPEARANCEUR CLOUDY (A) 05/28/2016 1412   LABSPEC 1.016 05/28/2016 1412   PHURINE 5.5 05/28/2016 1412   GLUCOSEU NEGATIVE 05/28/2016 1412   HGBUR SMALL (A) 05/28/2016 1412   BILIRUBINUR NEGATIVE 05/28/2016 1412   BILIRUBINUR neg 07/03/2014 1320   [place] 05/28/2016 1412   PROTEINUR NEGATIVE 05/28/2016 1412   UROBILINOGEN negative 07/03/2014 1320   NITRITE NEGATIVE 05/28/2016 1412   LEUKOCYTESUR SMALL (A) 05/28/2016 1412    ----------------------------------------------------------------------------------------------------------------   Imaging Results:    Dg Chest 2 View  Result Date: 06/01/2016 CLINICAL DATA:  Confusion and weakness. History of stage IV right lower lobe lung adenocarcinoma. EXAM: CHEST  2 VIEW COMPARISON:  05/27/2016 FINDINGS: Chronic lung disease again noted. Right perihilar opacity partly corresponds to known  pulmonary mass. There may be a component of adjacent atelectasis or pneumonia. No pulmonary edema or pleural fluid identified. No pneumothorax. The heart size is stable and normal. IMPRESSION: Right perihilar tumor with potential adjacent atelectasis or pneumonia. Electronically Signed   By: Aletta Edouard M.D.   On: 06/01/2016 16:09    My personal review of EKG: Rhythm NSR, Rate  86 /min, QTc 423 , no Acute ST changes   Assessment & Plan:    Active Problems:   Diabetes mellitus type 2 with complications (  [place])   Adenocarcinoma of right lung, stage 4 ([facility])   Protein-calorie malnutrition, severe   Hypotension   Bone metastases (H[facility])   AKI (acute kidney injury) ([place])   SIRS - Patient presents with hypotension, tachypnea, leukocytosis and elevated lactic acid - So far could not identify source of infection, but has recently treated for Escherichia coli UTI, was on oral amoxicillin, await repeat urinalysis, chest x-ray with evidence of cancer, possible pneumonia, but clinically he denies any cough or productive sputum, blood culture on recent admissions remains with no growth to date, he presents with diarrhea, so will obtain C. difficile to rule out infection, source of infection is identified, will be sepsis - Follow on repeat blood cultures, follow on urine analysis, continue with IV fluids, trend and pro-calcitonin and lactic acid, - Meanwhile will hold on IV antibiotics especially in the setting of diarrhea and possible C. Difficile.  Diarrhea - Patient recently on antibiotics, will obtain C. difficile, hold amoxicillin, empirically on oral vancomycin until C diff ruled out  AKI - Secondary to volume depletion and dehydration, continue with IV fluids - No evidence of urinary retention, and an out to be 300 mL.  Adenocarcinoma of the lung stage IV with metastasis - Followed by Dr. [name], currently on palliative radiation, scheduled for session tomorrow.  Hypertension - Patient  hypotensive on admission, hold all medication  Diabetes mellitus - Hold metformin and Actos, continue with insulin sliding scale  Failure to thrive/severe protein calorie malnutrition - Continue with supplemental, appetite stimulant, consult PT when stable  DVT Prophylaxis Heparin -   SCDs  AM Labs Ordered, also please review Full Orders  Family Communication: Admission, patients condition and plan of care including tests being ordered have been discussed with the patient and wife and son who indicate understanding and agree with the plan and Code Status.  Code Status Full  Likely DC to  Pending further workup.  Condition GUARDED    Consults called: None  Admission status: inpatient  Time spent in minutes : 65 minutes   Ree Alcalde M.D on 06/01/2016 at 6:15 PM  Between 7am to 7pm - Pager - [phone]. After 7pm go to [email or web address] - password [facility]  [facility] - Office  [phone]

## 2016-06-01 NOTE — ED Provider Notes (Signed)
Medical screening examination/treatment/procedure(s) were conducted as a shared visit with non-physician practitioner(s) and myself.  I personally evaluated the patient during the encounter.   EKG Interpretation None     pt here from cancer center w/ weakness and diarrhea Evidence of dehydration noted on labs Will admit for fluids   Lacretia Leigh, MD 06/01/16 1743

## 2016-06-01 NOTE — Progress Notes (Addendum)
Patient education done, radiation therapy  And you book , radiaplex gel, discussed ways  To manage side effects , throat changes,difficulty swallowing, may need to have carafte rx given,  , may need to eat 5-6 smaller meals and snacks in between, soft foods, ensure, boost, increase protein in diet, skin irritations, fatigue, pain, weight loss, loss of hair on area treated, b/l shoulders and sternum, gave my business card, and radiaplex gel to apply to all sites after radiation and bedtime,  Wife and son gave verbal understanding, took vitals both armms,  \left arm vitals =102/67,P=104 Right arm vitals 87/65,p=76 94% room air 98.6,RR=20 Escorted patient to bathroom, son assisted in standing patient from w/c, patient cannot stand on his own, too weak,  Said his legs felt like 500 pounds, only taking in ensure 1 can last night after discharge and 1 can this am, along with sips drinks stated, patient had diarrhea, washed his pants, placed new depends on patient and pulled up pants with assistance of son and Clinical biochemist, then escorted patient to [facility] #3, called Abigail elmore Education officer, museum, wife unable to take care of husband at home by herself, gave rx for bed side commode, and urinal and bedpan to wife,  They are in favor of assistance, placement ,  stated home health was supposed to call today, Vernie Shanks talking with son and wife while patient getting radiation treatment to his esophagus, Called back to [place] #3, took another depends , and cleaned patient on table, another bout of diarrhea,, pants completely soaked with feces, placed gown over patient and talking with Johnnye Lana, social worker, patient being sent to the ED   1:29 PM

## 2016-06-01 NOTE — ED Notes (Signed)
RN is starting a line and collecting blood work

## 2016-06-01 NOTE — Progress Notes (Signed)
Patient education done, radiation therapy  And you book , radiaplex gel, discussed ways  To manage siode effects  \left arm vitals =102/67,P=104 Right arm vitals 87/65,p=76 94% room air

## 2016-06-01 NOTE — ED Notes (Addendum)
This RN assisted pt to sit on the side of the bed and attempt to urinate. Pt was unable to urinate at this time for his urine sample. Pt instructed to use the call bell when he felt like he could go.

## 2016-06-02 ENCOUNTER — Telehealth: Payer: Self-pay

## 2016-06-02 ENCOUNTER — Encounter (HOSPITAL_COMMUNITY): Payer: Self-pay

## 2016-06-02 ENCOUNTER — Encounter (HOSPITAL_COMMUNITY): Payer: Self-pay | Admitting: *Deleted

## 2016-06-02 ENCOUNTER — Ambulatory Visit
Admission: RE | Admit: 2016-06-02 | Discharge: 2016-06-02 | Disposition: A | Discharge status: Home / Self Care | Payer: Commercial Managed Care - HMO | Source: Ambulatory Visit | Attending: Radiation Oncology | Admitting: Radiation Oncology

## 2016-06-02 DIAGNOSIS — A419 Sepsis, unspecified organism: Principal | ICD-10-CM

## 2016-06-02 DIAGNOSIS — E43 Unspecified severe protein-calorie malnutrition: Secondary | ICD-10-CM

## 2016-06-02 DIAGNOSIS — E876 Hypokalemia: Secondary | ICD-10-CM

## 2016-06-02 DIAGNOSIS — C3491 Malignant neoplasm of unspecified part of right bronchus or lung: Secondary | ICD-10-CM

## 2016-06-02 DIAGNOSIS — N179 Acute kidney failure, unspecified: Secondary | ICD-10-CM

## 2016-06-02 LAB — CBC
HEMATOCRIT: 31.6 % — AB (ref 39.0–52.0)
Hemoglobin: 10.4 g/dL — ABNORMAL LOW (ref 13.0–17.0)
MCH: 32.3 pg (ref 26.0–34.0)
MCHC: 32.9 g/dL (ref 30.0–36.0)
MCV: 98.1 fL (ref 78.0–100.0)
Platelets: 289 10*3/uL (ref 150–400)
RBC: 3.22 MIL/uL — ABNORMAL LOW (ref 4.22–5.81)
RDW: 14.1 % (ref 11.5–15.5)
WBC: 16.2 10*3/uL — ABNORMAL HIGH (ref 4.0–10.5)

## 2016-06-02 LAB — GLUCOSE, CAPILLARY
GLUCOSE-CAPILLARY: 157 mg/dL — AB (ref 65–99)
Glucose-Capillary: 141 mg/dL — ABNORMAL HIGH (ref 65–99)
Glucose-Capillary: 171 mg/dL — ABNORMAL HIGH (ref 65–99)

## 2016-06-02 LAB — BASIC METABOLIC PANEL
Anion gap: 9 (ref 5–15)
BUN: 37 mg/dL — AB (ref 6–20)
CALCIUM: 7.8 mg/dL — AB (ref 8.9–10.3)
CO2: 27 mmol/L (ref 22–32)
CREATININE: 1.37 mg/dL — AB (ref 0.61–1.24)
Chloride: 103 mmol/L (ref 101–111)
GFR calc Af Amer: 54 mL/min — ABNORMAL LOW (ref >60)
GFR, EST NON AFRICAN AMERICAN: 47 mL/min — AB (ref >60)
GLUCOSE: 168 mg/dL — AB (ref 65–99)
Potassium: 3.4 mmol/L — ABNORMAL LOW (ref 3.5–5.1)
Sodium: 139 mmol/L (ref 135–145)

## 2016-06-02 LAB — MAGNESIUM: MAGNESIUM: 1.3 mg/dL — AB (ref 1.7–2.4)

## 2016-06-02 LAB — MRSA PCR SCREENING: MRSA BY PCR: NEGATIVE

## 2016-06-02 MED ORDER — SODIUM CHLORIDE 0.9 % IV BOLUS (SEPSIS)
250.0000 mL | Freq: Once | INTRAVENOUS | Status: AC
  Administered 2016-06-02: 250 mL via INTRAVENOUS

## 2016-06-02 MED ORDER — MAGNESIUM SULFATE 4 GM/100ML IV SOLN
4.0000 g | Freq: Once | INTRAVENOUS | Status: AC
Start: 2016-06-02 — End: 2016-06-02
  Administered 2016-06-02: 4 g via INTRAVENOUS
  Filled 2016-06-02 (×2): qty 100

## 2016-06-02 MED ORDER — ENSURE ENLIVE PO LIQD
237.0000 mL | Freq: Three times a day (TID) | ORAL | Status: DC
  Administered 2016-06-02 – 2016-06-08 (×8): 237 mL via ORAL

## 2016-06-02 NOTE — Progress Notes (Signed)
Pt to radiation

## 2016-06-02 NOTE — Care Management Note (Signed)
Case Management Note  Patient Details  Name: Alexander Landry MRN: [id number] Date of Birth: [birth year]  Subjective/Objective:      Sepsis versus pna             Action/Plan: lives at home with spouse has a Immunologist through home instead. Date:  June 02, 2016 Chart reviewed for concurrent status and case management needs. Will continue to follow patient progress. Discharge Planning: following for needs Expected discharge date: [id number] Velva Harman, BSN, [place], [place]  Expected Discharge Date:   (unknown)               Expected Discharge Plan:  [place]  In-House Referral:     Discharge planning Services  CM Consult  Post Acute Care Choice:    Choice offered to:     DME Arranged:    DME Agency:     HH Arranged:    [place] Agency:     Status of Service:  In process, will continue to follow  If discussed at Long Length of Stay Meetings, dates discussed:    Additional Comments:  Leeroy Cha, RN 06/02/2016, 10:24 AM

## 2016-06-02 NOTE — Telephone Encounter (Signed)
I called and spoke with [name]'s nurse on [facility] [place] [facility] this morning. [name] is stable and well to have his radiation today at 12:15. I have made Scott aware that he will be available to be transported to radiation for his treatment today.

## 2016-06-02 NOTE — Progress Notes (Signed)
PROGRESS NOTE                                                                                                                                                                                                             Patient Demographics:    Alexander Landry, is a 82 y.o. male, DOB - 06/17/1933, SHF:[id number]  Admit date - 06/01/2016   Admitting Physician Albertine Patricia, MD  Outpatient Primary MD for the patient is [name], MD  LOS - 1  Outpatient Specialists: Rad onc Dr Julien Nordmann   Chief Complaint  Patient presents with  . Weakness  . Diarrhea       Brief Narrative   82 year old male with hypertension, diabetes mellitus, Hodgkin's lymphoma status post systemic chemotherapy about 20 years back, recently diagnosed metastatic bronchogenic adenocarcinoma, hospitalized at [facility] with acute renal failure and pneumonia and ongoing fever (felt secondary to cancer) and was discharged home on 11/20. He was started on palliative radiation during the hospitalization. He was sent to the ED after completion of radiation with generalized weakness and 2 episodes of watery diarrhea. Reported subjective fevers and chills. Denied chest pain, cough, shortness of breath, abdominal pain, nausea, vomiting, dysuria or blood per rectum.  Patient was septic with fever of 100.59F, hypotensive with blood pressure of 87/65 mmHg, hypoxic and tachypnea. Blood work showed leukocytosis with WBC of 16.2, potassium of 3.4, AKA with creatinine 1.37 and elevated lactic acid of 89. Sepsis pathway initiated. No clear signs of infection. Received IV fluid bolus and admitted to stepdown unit.   Subjective:   Patient reports feeling weak. He is somewhat confused which per his wife is normal when he is not in a familiar environment.   Assessment  & Plan :   Principal problem: Sepsis ([place]) No clear source of infection. Was being  treated with antibiotics for recent Escherichia coli UTI. Started on oral vancomycin for suspected C. difficile. No further diarrhea since admission. Sepsis now resolved. Could be secondary to severe dehydration and failure to thrive. Continue gentle IV hydration. Follow blood culture results. Transfer to telemetry.    Active Problems: Diarrhea Possibly due to antibiotics. Empiric oral vancomycin. Rule out C. difficile.  Acute kidney injury  secondary to severe dehydration. Continue IV fluids.    Adenocarcinoma of right lung, stage 4 ([place]), with bony metastases. Currently  on palliative radiation. Scheduled for today.    Protein-calorie malnutrition, severe Nutrition supplement as tolerated. PT evaluation. Lives with wife has difficulty taking care of him and he is interested in sending him to short-term rehabilitation. Will consult social work.   Acute encephalopathy Has some underlying confusion, possibly due to sepsis. Monitor closely.  Diabetes mellitus type 2 Holding metformin and Actos. Monitor with sliding scale coverage.  Hypotension On admission, secondary to sepsis. Holding home medications. Continue gentle hydration.  Hypokalemia/hypomagnesemia Replenished.  Code Status : Full code   Family Communication  : Wife at bedside  Disposition Plan  : Possibly needs SNF  Barriers For Discharge : Active symptoms  Consults  :  Radiation oncology  Procedures  : None  DVT Prophylaxis  :  Lovenox   Lab Results  Component Value Date   PLT 289 06/02/2016    Antibiotics  :    Anti-infectives    Start     Dose/Rate Route Frequency Ordered Stop   06/01/16 1815  vancomycin (VANCOCIN) 50 mg/mL oral solution 125 mg     125 mg Oral Every 6 hours 06/01/16 1813          Objective:   Vitals:   06/02/16 0600 06/02/16 0700 06/02/16 0800 06/02/16 0900  BP: (!) 120/59 100/69 (!) 129/94 (!) 123/38  Pulse: 94 93 93 (!) 101  Resp: (!) 21 (!) 26 (!) 25 (!) 28  Temp:    98.1 F (36.7 C)   TempSrc:   Oral   SpO2: 93% 93% 94% 92%    Wt Readings from Last 3 Encounters:  05/31/16 89.2 kg (196 lb 10.4 oz)  05/24/16 86.9 kg (191 lb 8 oz)  05/10/16 93.9 kg (207 lb)     Intake/Output Summary (Last 24 hours) at 06/02/16 1122 Last data filed at 06/02/16 0900  Gross per 24 hour  Intake           991.25 ml  Output                0 ml  Net           991.25 ml     Physical Exam  Gen: Sleepy but easily arousable, confused HEENT: no pallor, dry mucosa, supple neck Chest: clear b/l, no added sounds CVS: N S1&S2, no murmurs, rubs or gallop GI: soft, NT, ND, BS+ Musculoskeletal: warm, no edema CNS: Alert and oriented 1, nonfocal    Data Review:    CBC  Recent Labs Lab 05/29/16 0538 05/30/16 0825 05/31/16 0856 06/01/16 1548 06/02/16 0314  WBC 16.8* 16.3* 21.0* 22.8* 16.2*  HGB 12.3* 11.8* 10.5* 12.1* 10.4*  HCT 36.6* 35.3* 32.3* 37.1* 31.6*  PLT 241 237 313 318 289  MCV 95.3 95.9 97.0 96.9 98.1  MCH 32.0 32.1 31.5 31.6 32.3  MCHC 33.6 33.4 32.5 32.6 32.9  RDW 13.7 13.6 14.3 14.0 14.1  LYMPHSABS 2.7 2.5 3.0 3.3  --   MONOABS 2.1* 2.2* 2.7* 2.9*  --   EOSABS 0.4 0.5 0.4 0.4  --   BASOSABS 0.0 0.1 0.1 0.1  --     Chemistries   Recent Labs Lab 05/29/16 0538 05/30/16 0825 05/31/16 0502 06/01/16 1548 06/02/16 0314  NA 137 138 141 141 139  K 3.4* 3.1* 3.1* 4.0 3.4*  CL 93* 95* 100* 99* 103  CO2 '31 31 30 30 27  '$ GLUCOSE 155* 154* 148* 155* 168*  BUN 41* 30* 25* 48* 37*  CREATININE 1.36* 1.22 1.18 1.59* 1.37*  CALCIUM 8.4* 8.5* 8.5* 8.9 7.8*  MG  --   --   --   --  1.3*  AST  --   --   --  27  --   ALT  --   --   --  39  --   ALKPHOS  --   --   --  123  --   BILITOT  --   --   --  0.9  --    ------------------------------------------------------------------------------------------------------------------ No results for input(s): CHOL, HDL, LDLCALC, TRIG, CHOLHDL, LDLDIRECT in the last 72 hours.  Lab Results  Component Value  Date   HGBA1C 6.8 (H) 05/25/2016   ------------------------------------------------------------------------------------------------------------------ No results for input(s): TSH, T4TOTAL, T3FREE, THYROIDAB in the last 72 hours.  Invalid input(s): FREET3 ------------------------------------------------------------------------------------------------------------------ No results for input(s): VITAMINB12, FOLATE, FERRITIN, TIBC, IRON, RETICCTPCT in the last 72 hours.  Coagulation profile No results for input(s): INR, PROTIME in the last 168 hours.  No results for input(s): DDIMER in the last 72 hours.  Cardiac Enzymes No results for input(s): CKMB, TROPONINI, MYOGLOBIN in the last 168 hours.  Invalid input(s): CK ------------------------------------------------------------------------------------------------------------------ No results found for: BNP  Inpatient Medications  Scheduled Meds: . dronabinol  2.5 mg Oral BID  . feeding supplement (ENSURE ENLIVE)  237 mL Oral BID BM  . heparin  5,000 Units Subcutaneous Q8H  . insulin aspart  0-9 Units Subcutaneous TID WC  . mirtazapine  15 mg Oral QHS  . multivitamin with minerals  1 tablet Oral Daily  . potassium chloride  20 mEq Oral Daily  . simvastatin  20 mg Oral q1800  . vancomycin  125 mg Oral Q6H   Continuous Infusions: . sodium chloride 75 mL/hr at 06/02/16 0900   PRN Meds:.acetaminophen, albuterol, HYDROcodone-acetaminophen, ondansetron **OR** ondansetron (ZOFRAN) IV  Micro Results Recent Results (from the past 240 hour(s))  MRSA PCR Screening     Status: None   Collection Time: 05/24/16 11:24 PM  Result Value Ref Range Status   MRSA by PCR NEGATIVE NEGATIVE Final    Comment:        The GeneXpert MRSA Assay (FDA approved for NASAL specimens only), is one component of a comprehensive MRSA colonization surveillance program. It is not intended to diagnose MRSA infection nor to guide or monitor treatment for MRSA  infections.   Culture, blood (routine x 2) Call MD if unable to obtain prior to antibiotics being given     Status: None   Collection Time: 05/27/16 11:23 AM  Result Value Ref Range Status   Specimen Description BLOOD LEFT ARM  Final   Special Requests IN PEDIATRIC BOTTLE [place]  Final   Culture NO GROWTH 5 DAYS  Final   Report Status 06/01/2016 FINAL  Final  Culture, blood (routine x 2) Call MD if unable to obtain prior to antibiotics being given     Status: None   Collection Time: 05/27/16 11:28 AM  Result Value Ref Range Status   Specimen Description BLOOD LEFT HAND  Final   Special Requests IN PEDIATRIC BOTTLE 3CC  Final   Culture NO GROWTH 5 DAYS  Final   Report Status 06/01/2016 FINAL  Final  Urine culture     Status: Abnormal   Collection Time: 05/27/16  1:24 PM  Result Value Ref Range Status   Specimen Description URINE, CLEAN CATCH  Final   Special Requests NONE  Final   Culture >=100,000 COLONIES/mL ESCHERICHIA COLI (A)  Final   Report Status 05/29/2016 FINAL  Final  Organism ID, Bacteria ESCHERICHIA COLI (A)  Final      Susceptibility   Escherichia coli - MIC*    AMPICILLIN <=2 SENSITIVE Sensitive     CEFAZOLIN <=4 SENSITIVE Sensitive     CEFTRIAXONE <=1 SENSITIVE Sensitive     CIPROFLOXACIN <=0.25 SENSITIVE Sensitive     GENTAMICIN <=1 SENSITIVE Sensitive     IMIPENEM <=0.25 SENSITIVE Sensitive     NITROFURANTOIN <=16 SENSITIVE Sensitive     TRIMETH/SULFA <=20 SENSITIVE Sensitive     AMPICILLIN/SULBACTAM <=2 SENSITIVE Sensitive     PIP/TAZO <=4 SENSITIVE Sensitive     Extended ESBL NEGATIVE Sensitive     * >=100,000 COLONIES/mL ESCHERICHIA COLI  Urine culture     Status: None   Collection Time: 05/28/16  2:12 PM  Result Value Ref Range Status   Specimen Description URINE, RANDOM  Final   Special Requests NONE  Final   Culture NO GROWTH  Final   Report Status 05/29/2016 FINAL  Final  MRSA PCR Screening     Status: None   Collection Time: 06/01/16  7:00 PM    Result Value Ref Range Status   MRSA by PCR NEGATIVE NEGATIVE Final    Comment:        The GeneXpert MRSA Assay (FDA approved for NASAL specimens only), is one component of a comprehensive MRSA colonization surveillance program. It is not intended to diagnose MRSA infection nor to guide or monitor treatment for MRSA infections.     Radiology Reports Dg Chest 2 View  Result Date: 06/01/2016 CLINICAL DATA:  Confusion and weakness. History of stage IV right lower lobe lung adenocarcinoma. EXAM: CHEST  2 VIEW COMPARISON:  05/27/2016 FINDINGS: Chronic lung disease again noted. Right perihilar opacity partly corresponds to known pulmonary mass. There may be a component of adjacent atelectasis or pneumonia. No pulmonary edema or pleural fluid identified. No pneumothorax. The heart size is stable and normal. IMPRESSION: Right perihilar tumor with potential adjacent atelectasis or pneumonia. Electronically Signed   By: Aletta Edouard M.D.   On: 06/01/2016 16:09   Dg Chest 2 View  Result Date: 05/27/2016 CLINICAL DATA:  Fever with shortness of breath EXAM: CHEST  2 VIEW COMPARISON:  May 10, 2016 chest radiograph and PET-CT May 03, 2016 FINDINGS: Lungs are hyperexpanded. There is airspace consolidation in the right lower lobe in the area of known mass. The mass as a discrete structure known to be present in the right lower lobe is not evident by radiography. The lungs elsewhere are clear. No pneumothorax. Heart size is upper normal with pulmonary vascularity within normal limits. No adenopathy is appreciable by radiography. No bone lesions. IMPRESSION: Right lower lobe patchy infiltrate. Question element of hemorrhage from recent biopsy. There may also be superimposed pneumonia in the right base. The known mass in the right lower lobe region is not seen as a discrete structure by radiography. Lungs elsewhere clear. Stable cardiac silhouette. Electronically Signed   By: Lowella Grip III  M.D.   On: 05/27/2016 09:47   Mr Jeri Cos LO Contrast  Result Date: 05/27/2016 CLINICAL DATA:  82 y/o  M; metastatic lung cancer. EXAM: MRI HEAD WITHOUT AND WITH CONTRAST TECHNIQUE: Multiplanar, multiecho pulse sequences of the brain and surrounding structures were obtained without and with intravenous contrast. CONTRAST:  44m MULTIHANCE GADOBENATE DIMEGLUMINE 529 MG/ML IV SOLN COMPARISON:  None. FINDINGS: Brain: No diffusion signal abnormality. Moderate brain parenchymal volume loss. Mild chronic microvascular ischemic changes in periventricular white matter and the pons. Left  anterior corona radiata small no abnormal susceptibility hypointensity to indicate intracranial hemorrhage. Severe motion artifact of T1 postcontrast sequences. No gross evidence for abnormal enhancement. Chronic infarct. Vascular: Normal flow voids. Skull and upper cervical spine: Normal marrow signal. Left temporal scalp 10 mm cyst is probably sebaceous. Sinuses/Orbits: Negative.  Bilateral intra-ocular lens replacement. Other: None. IMPRESSION: 1. Extensive motion artifact of T1 postcontrast sequences. No gross evidence for abnormal enhancement. No signal abnormality on other sequences to suggest intracranial metastatic disease. 2. Moderate brain parenchymal volume loss, mild chronic microvascular ischemic changes, and chronic left anterior corona radiata small lacunar infarct. Electronically Signed   By: Kristine Garbe M.D.   On: 05/27/2016 20:49   US Renal  Result Date: 05/24/2016 CLINICAL DATA:  Acute kidney injury.  Abnormal labs today. EXAM: RENAL / URINARY TRACT ULTRASOUND COMPLETE COMPARISON:  PET-CT 05/03/2016 FINDINGS: Right Kidney: Length: 11.8 cm. Echogenicity within normal limits. No mass or hydronephrosis visualized. Left Kidney: Length: 12.2 cm. Echogenicity is normal. No hydronephrosis. A simple cyst is identified in the lower pole region measuring 2.9 x 2.6 x 2.3 cm. Bladder: Appears normal for degree of  bladder distention. Bilateral ureteral jets are visualized. IMPRESSION: 1. No hydronephrosis. 2. Simple cyst in the lower pole the left kidney. Electronically Signed   By: Nolon Nations M.D.   On: 05/24/2016 18:42   Nm Pet Image Initial (pi) Skull Base To Thigh  Result Date: 05/03/2016 CLINICAL DATA:  Initial treatment strategy for right pulmonary nodules on recent chest CT performed in the setting of chest pain. Remote history of Hodgkin's lymphoma. Former smoker. EXAM: NUCLEAR MEDICINE PET SKULL BASE TO THIGH TECHNIQUE: 11.7 mCi F-18 FDG was injected intravenously. Full-ring PET imaging was performed from the skull base to thigh after the radiotracer. CT data was obtained and used for attenuation correction and anatomic localization. FASTING BLOOD GLUCOSE:  Value: 177 mg/dl COMPARISON:  04/22/2016 chest CT. FINDINGS: NECK No hypermetabolic lymph nodes in the neck. Relatively symmetric hypermetabolism in the glottis without CT correlate, favor physiologic uptake. Mildly enlarged heterogeneous thyroid gland with no hypermetabolic thyroid nodules. CHEST Left main, left anterior descending, left circumflex and right coronary atherosclerosis. Atherosclerotic nonaneurysmal thoracic aorta. No pleural effusions. Hypermetabolic posterior right lower lobe 2.4 x 1.5 cm pulmonary nodule (series 8/image 39) with max SUV 5.6, stable in size since 04/22/2016. Right middle lobe 7 mm solid pulmonary nodule (series 8/image 47), below PET resolution, not associated with significant metabolism. Ill-defined 2.6 x 1.2 cm right upper lobe ground-glass nodule (series 8/image 20) with associated low level metabolism (max SUV 2.3). No acute consolidative airspace disease or additional significant pulmonary nodules. Relatively symmetric mild reticulonodular opacities at the lung apices are consistent with pleural-parenchymal scarring. Mild centrilobular emphysema. Hypermetabolic right hilar adenopathy with max SUV 7.9, poorly  delineated on the noncontrast CT images. No hypermetabolic axillary, mediastinal or left hilar nodes. ABDOMEN/PELVIS No abnormal hypermetabolic activity within the liver, pancreas, adrenal glands, or spleen. No hypermetabolic lymph nodes in the abdomen or pelvis. Cholecystectomy. Simple 2.7 cm renal cyst in the lateral lower left kidney. Atherosclerotic nonaneurysmal abdominal aorta. SKELETON There is faintly sclerotic hypermetabolic sternal osseous lesion with max SUV 10.7. There are several hypermetabolic osseous lesions in the thoracolumbar spine with associated faint sclerosis on the CT images, for example max SUV 9.1 in the T11 vertebral body and max SUV 9.4 in the right L3 posterior elements. There are hypermetabolic osseous lesions in the bilateral shoulder girdles and right ribs, for example max SUV 7.1 in the  right second rib and max SUV 7.1 in the left superior scapula. There are hypermetabolic skeletal foci in the left anterior acetabulum (max SUV 7.9) and medial right iliac bone. Multiple pins are seen in the right femoral neck. IMPRESSION: 1. Hypermetabolic 2.4 cm posterior right lower lobe pulmonary nodule, most consistent with primary bronchogenic carcinoma. 2. Hypermetabolic right hilar lymphadenopathy, consistent with nodal metastasis. No hypermetabolic mediastinal or contralateral hilar adenopathy. 3. Multifocal hypermetabolic osseous lesions throughout the axial skeleton and bilateral shoulder girdles, most prominent in the sternum and thoracic spine with associated faint sclerosis on the CT images, most consistent with multifocal osseous metastatic disease. 4. Subcentimeter right middle lobe pulmonary nodule, below PET resolution, indeterminate. Ill-defined ground-glass right upper lobe 2.3 cm pulmonary nodule with low level metabolism, cannot exclude indolent adenocarcinoma. Recommend follow-up of these nodules on chest CT in 3-6 months. 5. Additional findings include aortic atherosclerosis,  coronary atherosclerosis and mild emphysema. Electronically Signed   By: Ilona Sorrel M.D.   On: 05/03/2016 16:47   Ct Biopsy  Result Date: 05/10/2016 INDICATION: 82 year old with a suspicious nodule in the right lower lobe and multiple bone lesions. Findings are concerning for lung cancer. Tissue diagnosis is needed. EXAM: CT-GUIDED CORE BIOPSY OF RIGHT LOWER LOBE LUNG NODULE MEDICATIONS: None. ANESTHESIA/SEDATION: Moderate (conscious) sedation was employed during this procedure. A total of Versed 1.0 mg and Fentanyl 25 mcg was administered intravenously. Moderate Sedation Time: 15 minutes. The patient's level of consciousness and vital signs were monitored continuously by radiology nursing throughout the procedure under my direct supervision. FLUOROSCOPY TIME:  None COMPLICATIONS: None immediate. PROCEDURE: Informed written consent was obtained from the patient after a thorough discussion of the procedural risks, benefits and alternatives. All questions were addressed. A timeout was performed prior to the initiation of the procedure. Patient was placed on his right side. CT images through the chest were obtained. The nodule along the posterior right lower lobe was identified. Overlying skin was cleansed with chlorhexidine and sterile field was created. Skin was anesthetized with 1% lidocaine. A 17 gauge coaxial needle was directed into the pulmonary nodule with CT guidance. Needle position confirmed within the lesion. Two core biopsies were obtained with an 18 gauge core device. Specimens placed in formalin. 54 gauge needle was removed with the BiosSentry tract sealant. 17 gauge needle removed without complication. Bandage placed over the puncture site. FINDINGS: 2.4 cm pleural-based nodule in the right lower lobe. Needle position confirmed within this lesion. No significant pneumothorax or hemorrhage following the core biopsies. IMPRESSION: Successful CT-guided core biopsies of the right lower lobe nodule.  Electronically Signed   By: Markus Daft M.D.   On: 05/10/2016 16:23   Dg Chest Port 1 View  Result Date: 05/10/2016 CLINICAL DATA:  Status post RIGHT lung biopsy. EXAM: PORTABLE CHEST 1 VIEW COMPARISON:  Chest radiograph March 25, 2016 FINDINGS: Mildly tortuous aorta associated with hypertension. Cardiac silhouette is normal in size. Similar prominent interstitial markings and increased lung volumes most compatible with COPD. LEFT lung base atelectasis/ scarring. No pleural effusion. No pneumothorax. Soft tissue planes and included osseous structures are nonsuspicious. IMPRESSION: No pneumothorax. COPD.  LEFT lung base atelectasis/scarring. Electronically Signed   By: Elon Alas M.D.   On: 05/10/2016 14:33    Time Spent in minutes  35   Louellen Molder M.D on 06/02/2016 at 11:22 AM  Between 7am to 7pm - Pager - [phone]  After 7pm go to [email or web address] - password [facility]  [facility] -  Office  [phone]

## 2016-06-02 NOTE — Progress Notes (Signed)
Pt temp 100.7.Marland KitchenMarland Kitchen Alerted on call NP. Received orders to give 250 ml bolus. Bolus given. Will continue to monitor pt closely.

## 2016-06-02 NOTE — Progress Notes (Signed)
Initial Nutrition Assessment  DOCUMENTATION CODES:   Severe malnutrition in context of acute illness/injury  INTERVENTION:  - Continue Ensure Enlive but will increase to TID, each supplement provides 350 kcal and 20 grams of protein. - Will order Magic Cup BID with meals, each supplement provides 290 kcal and 9 grams of protein - Continue to encourage PO intakes of meals and supplements. - RD will continue to monitor for nutrition-related needs.  NUTRITION DIAGNOSIS:   Increased nutrient needs related to catabolic illness, cancer and cancer related treatments as evidenced by estimated needs.  GOAL:   Patient will meet greater than or equal to 90% of their needs  MONITOR:   PO intake, Supplement acceptance, Weight trends, Labs, I & O's  REASON FOR ASSESSMENT:   Malnutrition Screening Tool  ASSESSMENT:   82 y.o. 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,discharge yesterday from [facility] and due to acute renal failure, and pneumonia , continues to spike fever, felt secondary to cancer, had leukocytosis on discharge as well , patient started palliative radiation during that visit , he presents from radiation center today after finishing his radiation treatment for generalized weakness, failure to thrive, and diarrhea , patient reports that he developed today, he had 2 watery bowel movements during treatment session, he denies any chest pain, abdominal pain, coffee-ground emesis, nausea or vomiting, bright red blood per rectum or melena , he reports fever, chills.  Pt seen for MST. BMI indicates overweight status. Estimated nutrition needs based on weight from 11/20 (89.2 kg) as this is likely more reflective of CBW based on information obtained from pt's son. Pt had his third  Palliative radiation treatment today and was sleeping at time of RD visit; all information was provided by his son. Pt has had a poor appetite x2 weeks PTA and Marinol order was begun at [facility] on 11/19 (per son's report) and is currently ordered here. Son states that PTA pt was drinking ~2 Ensure/day but eating only 2-3 bites of food at any given meal. He states that pt has tolerated liquids and soft foods better as they require less energy to chew. Pt has been weak and sleeping more than usual. Son also states that d/t weakness pt has been unable to stand.   Before radiation pt drank 100% of an Ensure and after radiation had 3-4 bites of spaghetti. Pt has not mentioned any feelings of abdominal pain or nausea PTA or since admission either with or without PO intakes.   Physical assessment unable to be performed at this time with respect to pt's comfort while sleeping and blankets are pulled up to pt's chin at this time. Per chart review, weight on 10/30 was 207 lbs and on 11/20 was 196 lbs. This indicates 11 lb weight loss (5.3% body weight loss) in <1 month which is significant for time frame. Pt meets criteria for malnutrition based on weight loss and <50% needed intakes for >/=5 days.   Medications reviewed; 2.5 mg Marinol BID, sliding scale Novolog, 4 g IV Mg sulfate x1 dose today, daily multivitamin with minerals, PRN Zofran, 20 mEq oral KCl/day. Labs reviewed; CBGs: 141 and 157 mg/dL today, K: 3.4 mmol/L, Mg: 1.3 mg/dL, BUN: 37 mg/dL, creatinine: 1.37 mg/dL, Ca: 7.8 mg/dL, GFR: 54 mg/dL.  IVF: NS @ 75 mL/hr.     Diet Order:  Diet Carb Modified Fluid  consistency: Thin; Room service appropriate? Yes  Skin:  Reviewed, no issues  Last BM:  PTA/unknown  Height:   Ht Readings from Last 1 Encounters:  06/02/16 '6\' 3"'$  (1.905 m)    Weight:   Wt Readings from Last 1 Encounters:  06/02/16 213 lb 3 oz (96.7 kg)    Ideal Body Weight:  89.09 kg  BMI:  Body mass index is 26.65  kg/m.  Estimated Nutritional Needs:   Kcal:  2050-2320 (23-26 kcal/kg)  Protein:  107-125 grams (1.2-1.4 grams/kg)  Fluid:  >/= 2 L/day  EDUCATION NEEDS:   No education needs identified at this time    Jarome Matin, MS, RD, LDN, CNSC Inpatient Clinical Dietitian Pager # [phone] After hours/weekend pager # [phone]

## 2016-06-03 DIAGNOSIS — G9340 Encephalopathy, unspecified: Secondary | ICD-10-CM

## 2016-06-03 LAB — BASIC METABOLIC PANEL
Anion gap: 9 (ref 5–15)
BUN: 40 mg/dL — AB (ref 6–20)
CO2: 26 mmol/L (ref 22–32)
CREATININE: 1.48 mg/dL — AB (ref 0.61–1.24)
Calcium: 8 mg/dL — ABNORMAL LOW (ref 8.9–10.3)
Chloride: 104 mmol/L (ref 101–111)
GFR calc Af Amer: 49 mL/min — ABNORMAL LOW (ref >60)
GFR, EST NON AFRICAN AMERICAN: 43 mL/min — AB (ref >60)
GLUCOSE: 162 mg/dL — AB (ref 65–99)
POTASSIUM: 4 mmol/L (ref 3.5–5.1)
SODIUM: 139 mmol/L (ref 135–145)

## 2016-06-03 LAB — CBC
HEMATOCRIT: 29.6 % — AB (ref 39.0–52.0)
Hemoglobin: 9.8 g/dL — ABNORMAL LOW (ref 13.0–17.0)
MCH: 31.7 pg (ref 26.0–34.0)
MCHC: 33.1 g/dL (ref 30.0–36.0)
MCV: 95.8 fL (ref 78.0–100.0)
PLATELETS: 295 10*3/uL (ref 150–400)
RBC: 3.09 MIL/uL — ABNORMAL LOW (ref 4.22–5.81)
RDW: 14 % (ref 11.5–15.5)
WBC: 15.2 10*3/uL — AB (ref 4.0–10.5)

## 2016-06-03 LAB — MAGNESIUM: Magnesium: 2.3 mg/dL (ref 1.7–2.4)

## 2016-06-03 LAB — URINE CULTURE: CULTURE: NO GROWTH

## 2016-06-03 LAB — AMMONIA: AMMONIA: 21 umol/L (ref 9–35)

## 2016-06-03 LAB — TSH: TSH: 0.46 u[IU]/mL (ref 0.350–4.500)

## 2016-06-03 LAB — VITAMIN B12: Vitamin B-12: 423 pg/mL (ref 180–914)

## 2016-06-03 NOTE — Progress Notes (Signed)
PROGRESS NOTE                                                                                                                                                                                                             Patient Demographics:    Alexander Landry, is a 82 y.o. male, DOB - 11/25/1932, TKZ:[id number]  Admit date - 06/01/2016   Admitting Physician Albertine Patricia, MD  Outpatient Primary MD for the patient is [name], MD  LOS - 2  Outpatient Specialists: Rad onc Dr Julien Nordmann   Chief Complaint  Patient presents with  . Weakness  . Diarrhea       Brief Narrative   82 year old male with hypertension, diabetes mellitus, Hodgkin's lymphoma status post systemic chemotherapy about 20 years back, recently diagnosed metastatic bronchogenic adenocarcinoma, hospitalized at [facility] with acute renal failure and pneumonia and ongoing fever (felt secondary to cancer) and was discharged home on 11/20. He was started on palliative radiation during the hospitalization. He was sent to the ED after completion of radiation with generalized weakness and 2 episodes of watery diarrhea. Reported subjective fevers and chills. Denied chest pain, cough, shortness of breath, abdominal pain, nausea, vomiting, dysuria or blood per rectum.  Patient was septic with fever of 100.70F, hypotensive with blood pressure of 87/65 mmHg, hypoxic and tachypnea. Blood work showed leukocytosis with WBC of 16.2, potassium of 3.4, AKA with creatinine 1.37 and elevated lactic acid of 2.89. Sepsis pathway initiated. No clear signs of infection. Received IV fluid bolus and admitted to stepdown unit.   Subjective:   No further diarrhea. Still has confused.   Assessment  & Plan :   Principal problem:  Sepsis ([place]) -No clear source of infection. Was being treated with antibiotics for recent Escherichia coli UTI. -Started on oral  vancomycin for suspected C. difficile. No further diarrhea since admission. -Sepsis now resolved. Could be secondary to severe dehydration and failure to thrive.  Continue fluids.      Active Problems: Diarrhea No symptoms since admission. Vancomycin   Acute kidney injury secondary to severe dehydration. Continue IV fluids.    Adenocarcinoma of right lung, stage 4 ([place]),  with bony metastases. Currently on palliative radiation.     Protein-calorie malnutrition, severe Nutrition supplement as tolerated. PT recommends SNF.   Acute encephalopathy  possibly due to sepsis. Slowly improving  Diabetes mellitus type 2 Holding metformin and Actos. Monitor with sliding scale coverage.  Hypotension On admission, secondary to sepsis. Holding home medications. Continue gentle hydration.  Hypokalemia/hypomagnesemia with PVCs. Replenished.  Code Status : Full code   Family Communication  : Wife at bedside  Disposition Plan  : Possibly needs SNF  Barriers For Discharge : Active symptoms  Consults  :  Radiation oncology  Procedures  : None  DVT Prophylaxis  :  Lovenox   Lab Results  Component Value Date   PLT 295 06/03/2016    Antibiotics  :    Anti-infectives    Start     Dose/Rate Route Frequency Ordered Stop   06/01/16 1815  vancomycin (VANCOCIN) 50 mg/mL oral solution 125 mg     125 mg Oral Every 6 hours 06/01/16 1813          Objective:   Vitals:   06/02/16 1152 06/02/16 1334 06/02/16 1951 06/03/16 0828  BP:  (!) 130/59 130/63   Pulse:  90 84   Resp:  20 20   Temp: 99.2 F (37.3 C) 98.5 F (36.9 C) 97.6 F (36.4 C)   TempSrc: Axillary Oral Axillary   SpO2:  95% 96% (!) 87%  Weight:  96.7 kg (213 lb 3 oz)    Height:  '6\' 3"'$  (1.905 m)      Wt Readings from Last 3 Encounters:  06/02/16 96.7 kg (213 lb 3 oz)  05/31/16 89.2 kg (196 lb 10.4 oz)  05/24/16 86.9 kg (191 lb 8 oz)     Intake/Output Summary (Last 24 hours) at 06/03/16 1253 Last data filed at  06/03/16 0200  Gross per 24 hour  Intake             2705 ml  Output                0 ml  Net             2705 ml     Physical Exam  Gen: confused, more awake HEENT:  dry mucosa, supple neck Chest: clear b/l, no added sounds CVS: N S1&S2, no murmurs GI: soft, NT, ND Musculoskeletal: warm, no edema CNS: Alert and oriented 1, nonfocal    Data Review:    CBC  Recent Labs Lab 05/29/16 0538 05/30/16 0825 05/31/16 0856 06/01/16 1548 06/02/16 0314 06/03/16 0404  WBC 16.8* 16.3* 21.0* 22.8* 16.2* 15.2*  HGB 12.3* 11.8* 10.5* 12.1* 10.4* 9.8*  HCT 36.6* 35.3* 32.3* 37.1* 31.6* 29.6*  PLT 241 237 313 318 289 295  MCV 95.3 95.9 97.0 96.9 98.1 95.8  MCH 32.0 32.1 31.5 31.6 32.3 31.7  MCHC 33.6 33.4 32.5 32.6 32.9 33.1  RDW 13.7 13.6 14.3 14.0 14.1 14.0  LYMPHSABS 2.7 2.5 3.0 3.3  --   --   MONOABS 2.1* 2.2* 2.7* 2.9*  --   --   EOSABS 0.4 0.5 0.4 0.4  --   --   BASOSABS 0.0 0.1 0.1 0.1  --   --     Chemistries   Recent Labs Lab 05/30/16 0825 05/31/16 0502 06/01/16 1548 06/02/16 0314 06/03/16 0404  NA 138 141 141 139 139  K 3.1* 3.1* 4.0 3.4* 4.0  CL 95* 100* 99* 103 104  CO2 '31 30 30 27 26  '$ GLUCOSE  154* 148* 155* 168* 162*  BUN 30* 25* 48* 37* 40*  CREATININE 1.22 1.18 1.59* 1.37* 1.48*  CALCIUM 8.5* 8.5* 8.9 7.8* 8.0*  MG  --   --   --  1.3*  --   AST  --   --  27  --   --   ALT  --   --  39  --   --   ALKPHOS  --   --  123  --   --   BILITOT  --   --  0.9  --   --    ------------------------------------------------------------------------------------------------------------------ No results for input(s): CHOL, HDL, LDLCALC, TRIG, CHOLHDL, LDLDIRECT in the last 72 hours.  Lab Results  Component Value Date   HGBA1C 6.8 (H) 05/25/2016   ------------------------------------------------------------------------------------------------------------------ No results for input(s): TSH, T4TOTAL, T3FREE, THYROIDAB in the last 72 hours.  Invalid input(s):  FREET3 ------------------------------------------------------------------------------------------------------------------ No results for input(s): VITAMINB12, FOLATE, FERRITIN, TIBC, IRON, RETICCTPCT in the last 72 hours.  Coagulation profile No results for input(s): INR, PROTIME in the last 168 hours.  No results for input(s): DDIMER in the last 72 hours.  Cardiac Enzymes No results for input(s): CKMB, TROPONINI, MYOGLOBIN in the last 168 hours.  Invalid input(s): CK ------------------------------------------------------------------------------------------------------------------ No results found for: BNP  Inpatient Medications  Scheduled Meds: . dronabinol  2.5 mg Oral BID  . feeding supplement (ENSURE ENLIVE)  237 mL Oral TID BM  . heparin  5,000 Units Subcutaneous Q8H  . insulin aspart  0-9 Units Subcutaneous TID WC  . mirtazapine  15 mg Oral QHS  . multivitamin with minerals  1 tablet Oral Daily  . potassium chloride  20 mEq Oral Daily  . simvastatin  20 mg Oral q1800  . vancomycin  125 mg Oral Q6H   Continuous Infusions: . sodium chloride 125 mL/hr at 06/03/16 1253   PRN Meds:.acetaminophen, albuterol, HYDROcodone-acetaminophen, ondansetron **OR** ondansetron (ZOFRAN) IV  Micro Results Recent Results (from the past 240 hour(s))  MRSA PCR Screening     Status: None   Collection Time: 05/24/16 11:24 PM  Result Value Ref Range Status   MRSA by PCR NEGATIVE NEGATIVE Final    Comment:        The GeneXpert MRSA Assay (FDA approved for NASAL specimens only), is one component of a comprehensive MRSA colonization surveillance program. It is not intended to diagnose MRSA infection nor to guide or monitor treatment for MRSA infections.   Culture, blood (routine x 2) Call MD if unable to obtain prior to antibiotics being given     Status: None   Collection Time: 05/27/16 11:23 AM  Result Value Ref Range Status   Specimen Description BLOOD LEFT ARM  Final   Special  Requests IN PEDIATRIC BOTTLE [place]  Final   Culture NO GROWTH 5 DAYS  Final   Report Status 06/01/2016 FINAL  Final  Culture, blood (routine x 2) Call MD if unable to obtain prior to antibiotics being given     Status: None   Collection Time: 05/27/16 11:28 AM  Result Value Ref Range Status   Specimen Description BLOOD LEFT HAND  Final   Special Requests IN PEDIATRIC BOTTLE 3CC  Final   Culture NO GROWTH 5 DAYS  Final   Report Status 06/01/2016 FINAL  Final  Urine culture     Status: Abnormal   Collection Time: 05/27/16  1:24 PM  Result Value Ref Range Status   Specimen Description URINE, CLEAN CATCH  Final   Special Requests NONE  Final   Culture >=100,000 COLONIES/mL ESCHERICHIA COLI (A)  Final   Report Status 05/29/2016 FINAL  Final   Organism ID, Bacteria ESCHERICHIA COLI (A)  Final      Susceptibility   Escherichia coli - MIC*    AMPICILLIN <=2 SENSITIVE Sensitive     CEFAZOLIN <=4 SENSITIVE Sensitive     CEFTRIAXONE <=1 SENSITIVE Sensitive     CIPROFLOXACIN <=0.25 SENSITIVE Sensitive     GENTAMICIN <=1 SENSITIVE Sensitive     IMIPENEM <=0.25 SENSITIVE Sensitive     NITROFURANTOIN <=16 SENSITIVE Sensitive     TRIMETH/SULFA <=20 SENSITIVE Sensitive     AMPICILLIN/SULBACTAM <=2 SENSITIVE Sensitive     PIP/TAZO <=4 SENSITIVE Sensitive     Extended ESBL NEGATIVE Sensitive     * >=100,000 COLONIES/mL ESCHERICHIA COLI  Urine culture     Status: None   Collection Time: 05/28/16  2:12 PM  Result Value Ref Range Status   Specimen Description URINE, RANDOM  Final   Special Requests NONE  Final   Culture NO GROWTH  Final   Report Status 05/29/2016 FINAL  Final  Urine culture     Status: None   Collection Time: 06/01/16  3:40 PM  Result Value Ref Range Status   Specimen Description URINE, CLEAN CATCH  Final   Special Requests NONE  Final   Culture NO GROWTH Performed at [facility]   Final   Report Status 06/03/2016 FINAL  Final  Culture, blood (routine x 2)     Status:  None (Preliminary result)   Collection Time: 06/01/16  3:50 PM  Result Value Ref Range Status   Specimen Description BLOOD LEFT ANTECUBITAL  Final   Special Requests BOTTLES DRAWN AEROBIC AND ANAEROBIC 5CC  Final   Culture   Final    NO GROWTH < 24 HOURS Performed at [facility]    Report Status PENDING  Incomplete  Culture, blood (routine x 2)     Status: None (Preliminary result)   Collection Time: 06/01/16  4:12 PM  Result Value Ref Range Status   Specimen Description BLOOD RIGHT ANTECUBITAL  Final   Special Requests BOTTLES DRAWN AEROBIC AND ANAEROBIC 5CC  Final   Culture   Final    NO GROWTH < 24 HOURS Performed at [facility]    Report Status PENDING  Incomplete  MRSA PCR Screening     Status: None   Collection Time: 06/01/16  7:00 PM  Result Value Ref Range Status   MRSA by PCR NEGATIVE NEGATIVE Final    Comment:        The GeneXpert MRSA Assay (FDA approved for NASAL specimens only), is one component of a comprehensive MRSA colonization surveillance program. It is not intended to diagnose MRSA infection nor to guide or monitor treatment for MRSA infections.     Radiology Reports Dg Chest 2 View  Result Date: 06/01/2016 CLINICAL DATA:  Confusion and weakness. History of stage IV right lower lobe lung adenocarcinoma. EXAM: CHEST  2 VIEW COMPARISON:  05/27/2016 FINDINGS: Chronic lung disease again noted. Right perihilar opacity partly corresponds to known pulmonary mass. There may be a component of adjacent atelectasis or pneumonia. No pulmonary edema or pleural fluid identified. No pneumothorax. The heart size is stable and normal. IMPRESSION: Right perihilar tumor with potential adjacent atelectasis or pneumonia. Electronically Signed   By: Aletta Edouard M.D.   On: 06/01/2016 16:09   Dg Chest 2 View  Result Date: 05/27/2016 CLINICAL DATA:  Fever with  shortness of breath EXAM: CHEST  2 VIEW COMPARISON:  May 10, 2016 chest radiograph and PET-CT  May 03, 2016 FINDINGS: Lungs are hyperexpanded. There is airspace consolidation in the right lower lobe in the area of known mass. The mass as a discrete structure known to be present in the right lower lobe is not evident by radiography. The lungs elsewhere are clear. No pneumothorax. Heart size is upper normal with pulmonary vascularity within normal limits. No adenopathy is appreciable by radiography. No bone lesions. IMPRESSION: Right lower lobe patchy infiltrate. Question element of hemorrhage from recent biopsy. There may also be superimposed pneumonia in the right base. The known mass in the right lower lobe region is not seen as a discrete structure by radiography. Lungs elsewhere clear. Stable cardiac silhouette. Electronically Signed   By: Lowella Grip III M.D.   On: 05/27/2016 09:47   Mr Jeri Cos PN Contrast  Result Date: 05/27/2016 CLINICAL DATA:  82 y/o  M; metastatic lung cancer. EXAM: MRI HEAD WITHOUT AND WITH CONTRAST TECHNIQUE: Multiplanar, multiecho pulse sequences of the brain and surrounding structures were obtained without and with intravenous contrast. CONTRAST:  20m MULTIHANCE GADOBENATE DIMEGLUMINE 529 MG/ML IV SOLN COMPARISON:  None. FINDINGS: Brain: No diffusion signal abnormality. Moderate brain parenchymal volume loss. Mild chronic microvascular ischemic changes in periventricular white matter and the pons. Left anterior corona radiata small no abnormal susceptibility hypointensity to indicate intracranial hemorrhage. Severe motion artifact of T1 postcontrast sequences. No gross evidence for abnormal enhancement. Chronic infarct. Vascular: Normal flow voids. Skull and upper cervical spine: Normal marrow signal. Left temporal scalp 10 mm cyst is probably sebaceous. Sinuses/Orbits: Negative.  Bilateral intra-ocular lens replacement. Other: None. IMPRESSION: 1. Extensive motion artifact of T1 postcontrast sequences. No gross evidence for abnormal enhancement. No signal  abnormality on other sequences to suggest intracranial metastatic disease. 2. Moderate brain parenchymal volume loss, mild chronic microvascular ischemic changes, and chronic left anterior corona radiata small lacunar infarct. Electronically Signed   By: LKristine GarbeM.D.   On: 05/27/2016 20:49   [place]  Result Date: 05/24/2016 CLINICAL DATA:  Acute kidney injury.  Abnormal labs today. EXAM: RENAL / URINARY TRACT ULTRASOUND COMPLETE COMPARISON:  PET-CT 05/03/2016 FINDINGS: Right Kidney: Length: 11.8 cm. Echogenicity within normal limits. No mass or hydronephrosis visualized. Left Kidney: Length: 12.2 cm. Echogenicity is normal. No hydronephrosis. A simple cyst is identified in the lower pole region measuring 2.9 x 2.6 x 2.3 cm. Bladder: Appears normal for degree of bladder distention. Bilateral ureteral jets are visualized. IMPRESSION: 1. No hydronephrosis. 2. Simple cyst in the lower pole the left kidney. Electronically Signed   By: ENolon NationsM.D.   On: 05/24/2016 18:42   Ct Biopsy  Result Date: 05/10/2016 INDICATION: 82year old with a suspicious nodule in the right lower lobe and multiple bone lesions. Findings are concerning for lung cancer. Tissue diagnosis is needed. EXAM: CT-GUIDED CORE BIOPSY OF RIGHT LOWER LOBE LUNG NODULE MEDICATIONS: None. ANESTHESIA/SEDATION: Moderate (conscious) sedation was employed during this procedure. A total of Versed 1.0 mg and Fentanyl 25 mcg was administered intravenously. Moderate Sedation Time: 15 minutes. The patient's level of consciousness and vital signs were monitored continuously by radiology nursing throughout the procedure under my direct supervision. FLUOROSCOPY TIME:  None COMPLICATIONS: None immediate. PROCEDURE: Informed written consent was obtained from the patient after a thorough discussion of the procedural risks, benefits and alternatives. All questions were addressed. A timeout was performed prior to the initiation of the  procedure. Patient was  placed on his right side. CT images through the chest were obtained. The nodule along the posterior right lower lobe was identified. Overlying skin was cleansed with chlorhexidine and sterile field was created. Skin was anesthetized with 1% lidocaine. A 17 gauge coaxial needle was directed into the pulmonary nodule with CT guidance. Needle position confirmed within the lesion. Two core biopsies were obtained with an 18 gauge core device. Specimens placed in formalin. 30 gauge needle was removed with the BiosSentry tract sealant. 17 gauge needle removed without complication. Bandage placed over the puncture site. FINDINGS: 2.4 cm pleural-based nodule in the right lower lobe. Needle position confirmed within this lesion. No significant pneumothorax or hemorrhage following the core biopsies. IMPRESSION: Successful CT-guided core biopsies of the right lower lobe nodule. Electronically Signed   By: Markus Daft M.D.   On: 05/10/2016 16:23   Dg Chest Port 1 View  Result Date: 05/10/2016 CLINICAL DATA:  Status post RIGHT lung biopsy. EXAM: PORTABLE CHEST 1 VIEW COMPARISON:  Chest radiograph March 25, 2016 FINDINGS: Mildly tortuous aorta associated with hypertension. Cardiac silhouette is normal in size. Similar prominent interstitial markings and increased lung volumes most compatible with COPD. LEFT lung base atelectasis/ scarring. No pleural effusion. No pneumothorax. Soft tissue planes and included osseous structures are nonsuspicious. IMPRESSION: No pneumothorax. COPD.  LEFT lung base atelectasis/scarring. Electronically Signed   By: Elon Alas M.D.   On: 05/10/2016 14:33    Time Spent in minutes  25   Louellen Molder M.D on 06/03/2016 at 12:53 PM  Between 7am to 7pm - Pager - [phone]  After 7pm go to [email or web address] - password [facility]  [facility] -  Office  [phone]

## 2016-06-03 NOTE — Evaluation (Addendum)
Physical Therapy Evaluation Patient Details Name: Alexander Landry MRN: [id number] DOB: 1932/12/15 Today's Date: 06/03/2016   History of Present Illness  82 y.o. Male with recent DC from [facility] 05/31/16, admission was for PNA. 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   Clinical Impression  Pt admitted with above diagnosis. Pt currently with functional limitations due to the deficits listed below (see PT Problem List). +2 assist for bed to recliner transfer, pt is significantly deconditioned and needs SNF level of care. Family would like pt to go to [facility] if possible.  Pt will benefit from skilled PT to increase their independence and safety with mobility to allow discharge to the venue listed below.       Follow Up Recommendations SNF;Supervision/Assistance - 24 hour    Equipment Recommendations  None recommended by PT    Recommendations for Other Services OT consult     Precautions / Restrictions Precautions Precautions: Fall Precaution Comments: watch HR Restrictions Weight Bearing Restrictions: No      Mobility  Bed Mobility Overal bed mobility: Needs Assistance;+2 for physical assistance Bed Mobility: Supine to Sit     Supine to sit: Max assist;+2 for physical assistance     General bed mobility comments: max A to raise trunk, min A to advance BLEs  Transfers Overall transfer level: Needs assistance Equipment used: Rolling walker (2 wheeled) Transfers: Sit to/from Stand Sit to Stand: Mod assist;+2 physical assistance;+2 safety/equipment;From elevated surface         General transfer comment: assist to rise and steady, flexed posture standing, verbal and manual cues for hand placement  Ambulation/Gait Ambulation/Gait  assistance: +2 physical assistance;+2 safety/equipment;Min assist Ambulation Distance (Feet): 3 Feet Assistive device: Rolling walker (2 wheeled) Gait Pattern/deviations: Step-to pattern;Decreased step length - right;Decreased step length - left;Trunk flexed   Gait velocity interpretation: Below normal speed for age/gender General Gait Details: +2 for balance/safety, VCs for technique, distance limited by fatigue, SaO2 87% on RA, 93% on 4L O2, HR 112  Stairs            Wheelchair Mobility    Modified Rankin (Stroke Patients Only)       Balance     Sitting balance-Leahy Scale: Good       Standing balance-Leahy Scale: Poor                               Pertinent Vitals/Pain Faces Pain Scale: No hurt    Home Living Family/patient expects to be discharged to:: Skilled nursing facility Living Arrangements: Spouse/significant other Available Help at Discharge: Family;Available 24 hours/day Type of Home: House Home Access: Stairs to enter Entrance Stairs-Rails: Right Entrance Stairs-Number of Steps: 4 Home Layout: One level Home Equipment: Walker - 2 wheels;Shower seat;Cane - single point      Prior Function Level of Independence: Independent with assistive device(s)         Comments: was using RW for mobility per wife, hasn't walked in about a week     Hand Dominance   Dominant Hand: Right    Extremity/Trunk Assessment   Upper Extremity Assessment: Generalized weakness           Lower Extremity Assessment: Overall WFL for tasks assessed      Cervical / Trunk Assessment: Kyphotic (forward head)  Communication   Communication: HOH  Cognition Arousal/Alertness: Awake/alert Behavior During Therapy: WFL for tasks assessed/performed Overall Cognitive Status: Impaired/Different from baseline Area of Impairment: Attention;Memory;Safety/judgement;Problem solving Orientation Level: Disoriented to;Place;Time;Situation   Memory: Decreased  short-term memory   Safety/Judgement: Decreased awareness of safety;Decreased awareness of deficits   Problem Solving: Slow processing;Decreased initiation;Difficulty sequencing General Comments: able to follow some commands, not oriented to situation nor location, thought he was in [facility] Comments      Exercises  LAQs x 10 seated bilaterally AROM Marching x 5 seated bilaterally AROM Ankle pumps x 10 seated bilaterally AROM   Assessment/Plan    PT Assessment Patient needs continued PT services  PT Problem List Decreased strength;Decreased activity tolerance;Decreased balance;Decreased mobility;Decreased cognition;Decreased safety awareness;Cardiopulmonary status limiting activity          PT Treatment Interventions Gait training;DME instruction;Functional mobility training;Balance training;Therapeutic exercise;Therapeutic activities;Patient/family education    PT Goals (Current goals can be found in the Care Plan section)  Acute Rehab PT Goals Patient Stated Goal: to get stronger PT Goal Formulation: With family Time For Goal Achievement: 06/17/16 Potential to Achieve Goals: Good    Frequency Min 3X/week   Barriers to discharge        Co-evaluation               End of Session Equipment Utilized During Treatment: Gait belt;Oxygen Activity Tolerance: Patient limited by fatigue Patient left: in chair;with call bell/phone within reach;with chair alarm set Nurse Communication: Mobility status         Time: 5462-7035 PT Time Calculation (min) (ACUTE ONLY): 36 min   Charges:   PT Evaluation $PT Eval Moderate Complexity: 1 Procedure PT Treatments $Therapeutic Activity: 8-22 mins   PT G Codes:        Philomena Doheny 06/03/2016, 8:36 AM [phone]

## 2016-06-04 ENCOUNTER — Inpatient Hospital Stay (HOSPITAL_COMMUNITY): Payer: Commercial Managed Care - HMO

## 2016-06-04 ENCOUNTER — Other Ambulatory Visit (HOSPITAL_COMMUNITY): Payer: Commercial Managed Care - HMO

## 2016-06-04 LAB — RESPIRATORY PANEL BY PCR
ADENOVIRUS-RVPPCR: NOT DETECTED
Bordetella pertussis: NOT DETECTED
CORONAVIRUS NL63-RVPPCR: NOT DETECTED
CORONAVIRUS OC43-RVPPCR: NOT DETECTED
Chlamydophila pneumoniae: NOT DETECTED
Coronavirus 229E: NOT DETECTED
Coronavirus HKU1: NOT DETECTED
INFLUENZA A-RVPPCR: NOT DETECTED
Influenza B: NOT DETECTED
METAPNEUMOVIRUS-RVPPCR: NOT DETECTED
MYCOPLASMA PNEUMONIAE-RVPPCR: NOT DETECTED
PARAINFLUENZA VIRUS 1-RVPPCR: NOT DETECTED
PARAINFLUENZA VIRUS 2-RVPPCR: NOT DETECTED
PARAINFLUENZA VIRUS 4-RVPPCR: NOT DETECTED
Parainfluenza Virus 3: NOT DETECTED
Respiratory Syncytial Virus: NOT DETECTED
Rhinovirus / Enterovirus: NOT DETECTED

## 2016-06-04 LAB — BASIC METABOLIC PANEL
ANION GAP: 8 (ref 5–15)
BUN: 39 mg/dL — ABNORMAL HIGH (ref 6–20)
CALCIUM: 8.2 mg/dL — AB (ref 8.9–10.3)
CO2: 24 mmol/L (ref 22–32)
CREATININE: 1.52 mg/dL — AB (ref 0.61–1.24)
Chloride: 106 mmol/L (ref 101–111)
GFR calc Af Amer: 48 mL/min — ABNORMAL LOW (ref >60)
GFR, EST NON AFRICAN AMERICAN: 41 mL/min — AB (ref >60)
Glucose, Bld: 146 mg/dL — ABNORMAL HIGH (ref 65–99)
Potassium: 4.1 mmol/L (ref 3.5–5.1)
SODIUM: 138 mmol/L (ref 135–145)

## 2016-06-04 LAB — INFLUENZA PANEL BY PCR (TYPE A & B)
Influenza A By PCR: NEGATIVE
Influenza B By PCR: NEGATIVE

## 2016-06-04 LAB — PROCALCITONIN: PROCALCITONIN: 0.44 ng/mL

## 2016-06-04 LAB — CBC
HEMATOCRIT: 31.6 % — AB (ref 39.0–52.0)
HEMOGLOBIN: 10.3 g/dL — AB (ref 13.0–17.0)
MCH: 31.3 pg (ref 26.0–34.0)
MCHC: 32.6 g/dL (ref 30.0–36.0)
MCV: 96 fL (ref 78.0–100.0)
Platelets: 349 10*3/uL (ref 150–400)
RBC: 3.29 MIL/uL — AB (ref 4.22–5.81)
RDW: 14 % (ref 11.5–15.5)
WBC: 16 10*3/uL — ABNORMAL HIGH (ref 4.0–10.5)

## 2016-06-04 LAB — APTT: aPTT: 29 seconds (ref 24–36)

## 2016-06-04 LAB — PROTIME-INR
INR: 1.07
Prothrombin Time: 13.9 seconds (ref 11.4–15.2)

## 2016-06-04 LAB — URINALYSIS, ROUTINE W REFLEX MICROSCOPIC
Bilirubin Urine: NEGATIVE
GLUCOSE, UA: NEGATIVE mg/dL
Hgb urine dipstick: NEGATIVE
Ketones, ur: NEGATIVE mg/dL
LEUKOCYTES UA: NEGATIVE
Nitrite: NEGATIVE
PH: 5.5 (ref 5.0–8.0)
PROTEIN: NEGATIVE mg/dL
SPECIFIC GRAVITY, URINE: 1.014 (ref 1.005–1.030)

## 2016-06-04 LAB — LACTIC ACID, PLASMA
LACTIC ACID, VENOUS: 1.7 mmol/L (ref 0.5–1.9)
LACTIC ACID, VENOUS: 0.9 mmol/L (ref 0.5–1.9)

## 2016-06-04 MED ORDER — IOPAMIDOL (ISOVUE-300) INJECTION 61%
INTRAVENOUS | Status: AC
  Administered 2016-06-04: 100 mL
  Filled 2016-06-04: qty 30

## 2016-06-04 MED ORDER — VANCOMYCIN HCL IN DEXTROSE 750-5 MG/150ML-% IV SOLN
750.0000 mg | Freq: Two times a day (BID) | INTRAVENOUS | Status: DC
  Administered 2016-06-04 – 2016-06-06 (×4): 750 mg via INTRAVENOUS
  Filled 2016-06-04 (×5): qty 150

## 2016-06-04 MED ORDER — FUROSEMIDE 10 MG/ML IJ SOLN
40.0000 mg | Freq: Once | INTRAMUSCULAR | Status: AC
  Administered 2016-06-04: 40 mg via INTRAVENOUS
  Filled 2016-06-04: qty 4

## 2016-06-04 MED ORDER — SODIUM CHLORIDE 0.9 % IV BOLUS (SEPSIS)
2000.0000 mL | Freq: Once | INTRAVENOUS | Status: AC
  Administered 2016-06-04: 2000 mL via INTRAVENOUS

## 2016-06-04 MED ORDER — PIPERACILLIN-TAZOBACTAM 3.375 G IVPB 30 MIN
3.3750 g | Freq: Once | INTRAVENOUS | Status: AC
  Administered 2016-06-04: 3.375 g via INTRAVENOUS
  Filled 2016-06-04 (×2): qty 50

## 2016-06-04 MED ORDER — PIPERACILLIN-TAZOBACTAM 3.375 G IVPB
3.3750 g | Freq: Three times a day (TID) | INTRAVENOUS | Status: DC
  Administered 2016-06-04 – 2016-06-08 (×12): 3.375 g via INTRAVENOUS
  Filled 2016-06-04 (×13): qty 50

## 2016-06-04 MED ORDER — ACETAMINOPHEN 650 MG RE SUPP
650.0000 mg | RECTAL | Status: DC | PRN
  Administered 2016-06-04: 650 mg via RECTAL
  Filled 2016-06-04: qty 1

## 2016-06-04 MED ORDER — KETOROLAC TROMETHAMINE 15 MG/ML IJ SOLN
15.0000 mg | Freq: Three times a day (TID) | INTRAMUSCULAR | Status: DC
  Administered 2016-06-04 – 2016-06-08 (×13): 15 mg via INTRAVENOUS
  Filled 2016-06-04 (×14): qty 1

## 2016-06-04 MED ORDER — VANCOMYCIN HCL IN DEXTROSE 1-5 GM/200ML-% IV SOLN
1000.0000 mg | Freq: Once | INTRAVENOUS | Status: AC
  Administered 2016-06-04: 1000 mg via INTRAVENOUS
  Filled 2016-06-04: qty 200

## 2016-06-04 MED ORDER — IOPAMIDOL (ISOVUE-300) INJECTION 61%: 15.0000 mL | Freq: Once | INTRAVENOUS | Status: DC | PRN

## 2016-06-04 NOTE — [place] FL2 (Signed)
[place] MEDICAID FL2 LEVEL OF CARE SCREENING TOOL     IDENTIFICATION  Patient Name: Alexander Landry Birthdate: 1932-12-22 Sex: male Admission Date (Current Location): 06/01/2016  [facility] and [place] Number:  Herbalist and Address:  [facility],  [place] [address] [place]      Provider Number: [id number]  Attending Physician Name and Address:  Louellen Molder, MD  Relative Name and Phone Number:       Current Level of Care: Hospital Recommended Level of Care: [place] Prior Approval Number:    Date Approved/Denied:   PASRR Number:  [id number])  Discharge Plan: SNF    Current Diagnoses: Patient Active Problem List   Diagnosis Date Noted  . AKI (acute kidney injury) ([place]) 06/01/2016  . Fever   . E-coli UTI 05/29/2016  . HCAP (healthcare-associated pneumonia) 05/27/2016  . Bone metastases ([place]) 05/27/2016  . Lung cancer ([place])   . Hypotension   . Protein-calorie malnutrition, severe 05/25/2016  . Adenocarcinoma of right lung, stage 4 ([place]) 05/24/2016  . Hyperkalemia 05/24/2016  . Acute kidney injury ([place]) 05/24/2016  . Acute renal failure ([place])   . Hodgkin's lymphoma ([place])   . Lung nodules 04/23/2016  . Sternal pain 03/25/2016  . Chest pain 03/25/2016  . Spine deformity 03/25/2016  . Need for prophylactic vaccination and inoculation against influenza 03/25/2016  . Onychomycosis 10/23/2015  . Presbycusis of both ears 11/01/2014  . Diabetes mellitus type 2 with complications ([place]) 18/29/9371  . Hypertension associated with diabetes ([place]) 03/29/2013  . Hyperlipidemia with target LDL less than 70 03/29/2013  . History of Hodgkin's lymphoma 03/29/2013  . History of renal stone 03/29/2013    Orientation RESPIRATION BLADDER Height & Weight     Self, Time, Situation, Place  O2 (at 3L) Incontinent, External catheter Weight: 213 lb 3 oz (96.7 kg) Height:  '6\' 3"'$  (190.5 cm)  BEHAVIORAL SYMPTOMS/MOOD NEUROLOGICAL  BOWEL NUTRITION STATUS   (none )  (none ) Continent Diet (Carb Modified )  AMBULATORY STATUS COMMUNICATION OF NEEDS Skin   Extensive Assist Verbally Normal                       Personal Care Assistance Level of Assistance  Bathing, Dressing, Feeding Bathing Assistance: Maximum assistance Feeding assistance: Limited assistance Dressing Assistance: Maximum assistance     Functional Limitations Info  Speech, Hearing, Sight Sight Info: Adequate Hearing Info: Impaired Speech Info: Adequate    SPECIAL CARE FACTORS FREQUENCY  PT (By licensed PT)     PT Frequency: 3              Contractures      Additional Factors Info  Code Status, Allergies Code Status Info: FULL CODE Allergies Info: N/A           Current Medications (06/04/2016):  This is the current hospital active medication list Current Facility-Administered Medications  Medication Dose Route Frequency Provider Last Rate Last Dose  . 0.9 %  sodium chloride infusion   Intravenous Continuous Nishant Dhungel, MD 125 mL/hr at 06/04/16 1115    . acetaminophen (TYLENOL) suppository 650 mg  650 mg Rectal Q4H PRN Nishant Dhungel, MD   650 mg at 06/04/16 0830  . acetaminophen (TYLENOL) tablet 500 mg  500 mg Oral Q4H PRN Silver Huguenin Elgergawy, MD      . albuterol (PROVENTIL) (2.5 MG/3ML) 0.083% nebulizer solution 2.5 mg  2.5 mg Nebulization Q2H PRN Albertine Patricia, MD      .  dronabinol (MARINOL) capsule 2.5 mg  2.5 mg Oral BID Albertine Patricia, MD   2.5 mg at 06/03/16 2231  . feeding supplement (ENSURE ENLIVE) (ENSURE ENLIVE) liquid 237 mL  237 mL Oral TID BM Nishant Dhungel, MD   237 mL at 06/03/16 0904  . furosemide (LASIX) injection 40 mg  40 mg Intravenous Once Nishant Dhungel, MD      . heparin injection 5,000 Units  5,000 Units Subcutaneous Q8H Albertine Patricia, MD   5,000 Units at 06/04/16 1308  . HYDROcodone-acetaminophen (NORCO) 7.5-325 MG per tablet 1 tablet  1 tablet Oral Q4H PRN Lorayne Bender, PA-C   1  tablet at 06/03/16 2231  . insulin aspart (novoLOG) injection 0-9 Units  0-9 Units Subcutaneous TID WC Albertine Patricia, MD   1 Units at 06/04/16 1302  . iopamidol (ISOVUE-300) 61 % injection 15 mL  15 mL Oral Once PRN Nishant Dhungel, MD      . ketorolac (TORADOL) 15 MG/ML injection 15 mg  15 mg Intravenous Q8H Nishant Dhungel, MD   15 mg at 06/04/16 1308  . mirtazapine (REMERON) tablet 15 mg  15 mg Oral QHS Albertine Patricia, MD   15 mg at 06/03/16 2231  . multivitamin with minerals tablet 1 tablet  1 tablet Oral Daily Albertine Patricia, MD   1 tablet at 06/03/16 0904  . ondansetron (ZOFRAN) tablet 4 mg  4 mg Oral Q6H PRN Albertine Patricia, MD       Or  . ondansetron (ZOFRAN) injection 4 mg  4 mg Intravenous Q6H PRN Silver Huguenin Elgergawy, MD      . piperacillin-tazobactam (ZOSYN) IVPB 3.375 g  3.375 g Intravenous Q8H Nishant Dhungel, MD   3.375 g at 06/04/16 1308  . potassium chloride (K-DUR) CR tablet 20 mEq  20 mEq Oral Daily Albertine Patricia, MD   20 mEq at 06/03/16 0904  . simvastatin (ZOCOR) tablet 20 mg  20 mg Oral q1800 Albertine Patricia, MD   20 mg at 06/03/16 1648  . vancomycin (VANCOCIN) IVPB 750 mg/150 ml premix  750 mg Intravenous Q12H Nishant Dhungel, MD         Discharge Medications: Please see discharge summary for a list of discharge medications.  Relevant Imaging Results:  Relevant Lab Results:   Additional Information SSN [id number]  Rozell Searing

## 2016-06-04 NOTE — Progress Notes (Signed)
PROGRESS NOTE                                                                                                                                                                                                             Patient Demographics:    Yaphet Smethurst, is a 82 y.o. male, DOB - [birth year], LSL:[id number]  Admit date - 06/01/2016   Admitting Physician Albertine Patricia, MD  Outpatient Primary MD for the patient is [name], MD  LOS - 3  Outpatient Specialists: Rad onc Dr Julien Nordmann   Chief Complaint  Patient presents with  . Weakness  . Diarrhea       Brief Narrative   82 year old male with hypertension, diabetes mellitus, Hodgkin's lymphoma status post systemic chemotherapy about 20 years back, recently diagnosed metastatic bronchogenic adenocarcinoma, hospitalized at [facility] with acute renal failure and pneumonia and ongoing fever (felt secondary to cancer) and was discharged home on 11/20. He was started on palliative radiation during the hospitalization. He was sent to the ED after completion of radiation with generalized weakness and 2 episodes of watery diarrhea. Reported subjective fevers and chills. Denied chest pain, cough, shortness of breath, abdominal pain, nausea, vomiting, dysuria or blood per rectum.  Patient was septic with fever of 100.58F, hypotensive with blood pressure of 87/65 mmHg, hypoxic and tachypnea. Blood work showed leukocytosis with WBC of 16.2, potassium of 3.4, AKi with creatinine 1.37 and elevated lactic acid of 2.89. Sepsis pathway initiated. Cultures negative. Appeared to have improved but became septic again on 11/24.   Subjective:   Became septic overnight with fever and further leukocytosis. Sepsis pathway initiated again. Remains confused.   Assessment  & Plan :   Principal problem:  Sepsis ([place]) Recurrent symptoms. Sepsis pathway initiated again. IV  fluid bolus followed by maintenance. Repeated blood cultures sent. Started on empiric IV vancomycin and Zosyn. Discontinue oral vancomycin. No further diarrhea. -Aspiration is a possible concern. Swallow evaluation. -Check CT of the abdomen and pelvis given mild distention and tenderness on exam. -Final suppository.     Active Problems: Diarrhea No symptoms since admission. Discontinue oral vancomycin.  Acute kidney injury secondary to severe  dehydration/sepsis. Continue fluids.    Adenocarcinoma of right lung, stage 4 ([place]), with bony metastases. Currently on palliative radiation.     Protein-calorie malnutrition, severe Nutrition supplement as tolerated. PT recommends SNF.   Acute encephalopathy I suspect this is due to sepsis. Recent MRI was unremarkable. Upon discussing with patient's wife and son at bedside today he does have some baseline dementia.  Diabetes mellitus type 2 Holding metformin and Actos. Monitor with sliding scale coverage.  Hypotension On admission, secondary to sepsis. Holding home medications. Continue hydration.  Hypokalemia/hypomagnesemia with PVCs. Replenished.  Mild pulmonary congestion Seen on chest x-ray. Will reduce IV fluids and ordered was a Lasix.  Code Status : Full code   Family Communication  : Wife and son at bedside  Disposition Plan  :  needs SNF. Possibly early next week if improved.  Barriers For Discharge : Active symptoms  Consults  :  Radiation oncology  Procedures  : None  DVT Prophylaxis  :  Lovenox   Lab Results  Component Value Date   PLT 349 06/04/2016    Antibiotics  :    Anti-infectives    Start     Dose/Rate Route Frequency Ordered Stop   06/04/16 2000  vancomycin (VANCOCIN) IVPB 750 mg/150 ml premix     750 mg 150 mL/hr over 60 Minutes Intravenous Every 12 hours 06/04/16 1026     06/04/16 1400  piperacillin-tazobactam (ZOSYN) IVPB 3.375 g     3.375 g 12.5 mL/hr over 240 Minutes Intravenous Every 8  hours 06/04/16 0844     06/04/16 0800  piperacillin-tazobactam (ZOSYN) IVPB 3.375 g     3.375 g 100 mL/hr over 30 Minutes Intravenous  Once 06/04/16 0746 06/04/16 0915   06/04/16 0800  vancomycin (VANCOCIN) IVPB 1000 mg/200 mL premix     1,000 mg 200 mL/hr over 60 Minutes Intravenous  Once 06/04/16 0746 06/04/16 1014   06/01/16 1815  vancomycin (VANCOCIN) 50 mg/mL oral solution 125 mg  Status:  Discontinued     125 mg Oral Every 6 hours 06/01/16 1813 06/04/16 1244        Objective:   Vitals:   06/04/16 0701 06/04/16 0950 06/04/16 1114 06/04/16 1400  BP: (!) 144/55  128/72 140/70  Pulse: (!) 121  (!) 114 (!) 120  Resp: (!) 24  (!) 22 (!) 22  Temp: (!) 102.3 F (39.1 C) 98.7 F (37.1 C) 99.3 F (37.4 C) 99.2 F (37.3 C)  TempSrc: Oral Axillary Axillary Axillary  SpO2: 92%  94% 95%  Weight:      Height:        Wt Readings from Last 3 Encounters:  06/02/16 96.7 kg (213 lb 3 oz)  05/31/16 89.2 kg (196 lb 10.4 oz)  05/24/16 86.9 kg (191 lb 8 oz)     Intake/Output Summary (Last 24 hours) at 06/04/16 1512 Last data filed at 06/04/16 1308  Gross per 24 hour  Intake          3632.09 ml  Output              450 ml  Net          3182.09 ml     Physical Exam  Gen: Fatigued, confused HEENT:  dry mucosa, supple neck Chest: clear b/l, no added sounds CVS: N S1&S2, no murmurs GI: soft, mild distention, right lower quadrant tenderness, bowel sounds present Musculoskeletal: warm, no edema CNS: Alert and oriented 1 (recognizes family), sleepy, nonfocal    Data Review:  CBC  Recent Labs Lab 05/29/16 0538 05/30/16 0825 05/31/16 0856 06/01/16 1548 06/02/16 0314 06/03/16 0404 06/04/16 0339  WBC 16.8* 16.3* 21.0* 22.8* 16.2* 15.2* 16.0*  HGB 12.3* 11.8* 10.5* 12.1* 10.4* 9.8* 10.3*  HCT 36.6* 35.3* 32.3* 37.1* 31.6* 29.6* 31.6*  PLT 241 237 313 318 289 295 349  MCV 95.3 95.9 97.0 96.9 98.1 95.8 96.0  MCH 32.0 32.1 31.5 31.6 32.3 31.7 31.3  MCHC 33.6 33.4 32.5  32.6 32.9 33.1 32.6  RDW 13.7 13.6 14.3 14.0 14.1 14.0 14.0  LYMPHSABS 2.7 2.5 3.0 3.3  --   --   --   MONOABS 2.1* 2.2* 2.7* 2.9*  --   --   --   EOSABS 0.4 0.5 0.4 0.4  --   --   --   BASOSABS 0.0 0.1 0.1 0.1  --   --   --     Chemistries   Recent Labs Lab 05/31/16 0502 06/01/16 1548 06/02/16 0314 06/03/16 0404 06/04/16 0339  NA 141 141 139 139 138  K 3.1* 4.0 3.4* 4.0 4.1  CL 100* 99* 103 104 106  CO2 '30 30 27 26 24  '$ GLUCOSE 148* 155* 168* 162* 146*  BUN 25* 48* 37* 40* 39*  CREATININE 1.18 1.59* 1.37* 1.48* 1.52*  CALCIUM 8.5* 8.9 7.8* 8.0* 8.2*  MG  --   --  1.3* 2.3  --   AST  --  27  --   --   --   ALT  --  39  --   --   --   ALKPHOS  --  123  --   --   --   BILITOT  --  0.9  --   --   --    ------------------------------------------------------------------------------------------------------------------ No results for input(s): CHOL, HDL, LDLCALC, TRIG, CHOLHDL, LDLDIRECT in the last 72 hours.  Lab Results  Component Value Date   HGBA1C 6.8 (H) 05/25/2016   ------------------------------------------------------------------------------------------------------------------  Recent Labs  06/03/16 1359  TSH 0.460   ------------------------------------------------------------------------------------------------------------------  Recent Labs  06/03/16 1359  VITAMINB12 423    Coagulation profile  Recent Labs Lab 06/04/16 0820  INR 1.07    No results for input(s): DDIMER in the last 72 hours.  Cardiac Enzymes No results for input(s): CKMB, TROPONINI, MYOGLOBIN in the last 168 hours.  Invalid input(s): CK ------------------------------------------------------------------------------------------------------------------ No results found for: BNP  Inpatient Medications  Scheduled Meds: . dronabinol  2.5 mg Oral BID  . feeding supplement (ENSURE ENLIVE)  237 mL Oral TID BM  . heparin  5,000 Units Subcutaneous Q8H  . insulin aspart  0-9 Units  Subcutaneous TID WC  . iopamidol      . ketorolac  15 mg Intravenous Q8H  . mirtazapine  15 mg Oral QHS  . multivitamin with minerals  1 tablet Oral Daily  . piperacillin-tazobactam (ZOSYN)  IV  3.375 g Intravenous Q8H  . potassium chloride  20 mEq Oral Daily  . simvastatin  20 mg Oral q1800  . vancomycin  750 mg Intravenous Q12H   Continuous Infusions: . sodium chloride 125 mL/hr at 06/04/16 1115   PRN Meds:.acetaminophen, acetaminophen, albuterol, HYDROcodone-acetaminophen, iopamidol, ondansetron **OR** ondansetron (ZOFRAN) IV  Micro Results Recent Results (from the past 240 hour(s))  Culture, blood (routine x 2) Call MD if unable to obtain prior to antibiotics being given     Status: None   Collection Time: 05/27/16 11:23 AM  Result Value Ref Range Status   Specimen Description BLOOD LEFT ARM  Final  Special Requests IN PEDIATRIC BOTTLE 2CC  Final   Culture NO GROWTH 5 DAYS  Final   Report Status 06/01/2016 FINAL  Final  Culture, blood (routine x 2) Call MD if unable to obtain prior to antibiotics being given     Status: None   Collection Time: 05/27/16 11:28 AM  Result Value Ref Range Status   Specimen Description BLOOD LEFT HAND  Final   Special Requests IN PEDIATRIC BOTTLE 3CC  Final   Culture NO GROWTH 5 DAYS  Final   Report Status 06/01/2016 FINAL  Final  Urine culture     Status: Abnormal   Collection Time: 05/27/16  1:24 PM  Result Value Ref Range Status   Specimen Description URINE, CLEAN CATCH  Final   Special Requests NONE  Final   Culture >=100,000 COLONIES/mL ESCHERICHIA COLI (A)  Final   Report Status 05/29/2016 FINAL  Final   Organism ID, Bacteria ESCHERICHIA COLI (A)  Final      Susceptibility   Escherichia coli - MIC*    AMPICILLIN <=2 SENSITIVE Sensitive     CEFAZOLIN <=4 SENSITIVE Sensitive     CEFTRIAXONE <=1 SENSITIVE Sensitive     CIPROFLOXACIN <=0.25 SENSITIVE Sensitive     GENTAMICIN <=1 SENSITIVE Sensitive     IMIPENEM <=0.25 SENSITIVE  Sensitive     NITROFURANTOIN <=16 SENSITIVE Sensitive     TRIMETH/SULFA <=20 SENSITIVE Sensitive     AMPICILLIN/SULBACTAM <=2 SENSITIVE Sensitive     PIP/TAZO <=4 SENSITIVE Sensitive     Extended ESBL NEGATIVE Sensitive     * >=100,000 COLONIES/mL ESCHERICHIA COLI  Urine culture     Status: None   Collection Time: 05/28/16  2:12 PM  Result Value Ref Range Status   Specimen Description URINE, RANDOM  Final   Special Requests NONE  Final   Culture NO GROWTH  Final   Report Status 05/29/2016 FINAL  Final  Urine culture     Status: None   Collection Time: 06/01/16  3:40 PM  Result Value Ref Range Status   Specimen Description URINE, CLEAN CATCH  Final   Special Requests NONE  Final   Culture NO GROWTH Performed at [facility]   Final   Report Status 06/03/2016 FINAL  Final  Culture, blood (routine x 2)     Status: None (Preliminary result)   Collection Time: 06/01/16  3:50 PM  Result Value Ref Range Status   Specimen Description BLOOD LEFT ANTECUBITAL  Final   Special Requests BOTTLES DRAWN AEROBIC AND ANAEROBIC 5CC  Final   Culture   Final    NO GROWTH 2 DAYS Performed at [facility]    Report Status PENDING  Incomplete  Culture, blood (routine x 2)     Status: None (Preliminary result)   Collection Time: 06/01/16  4:12 PM  Result Value Ref Range Status   Specimen Description BLOOD RIGHT ANTECUBITAL  Final   Special Requests BOTTLES DRAWN AEROBIC AND ANAEROBIC 5CC  Final   Culture   Final    NO GROWTH 2 DAYS Performed at [facility]    Report Status PENDING  Incomplete  MRSA PCR Screening     Status: None   Collection Time: 06/01/16  7:00 PM  Result Value Ref Range Status   MRSA by PCR NEGATIVE NEGATIVE Final    Comment:        The GeneXpert MRSA Assay (FDA approved for NASAL specimens only), is one component of a comprehensive MRSA colonization surveillance program. It  is not intended to diagnose MRSA infection nor to guide or monitor  treatment for MRSA infections.   Respiratory Panel by PCR     Status: None   Collection Time: 06/04/16  9:22 AM  Result Value Ref Range Status   Adenovirus NOT DETECTED NOT DETECTED Final   Coronavirus 229E NOT DETECTED NOT DETECTED Final   Coronavirus HKU1 NOT DETECTED NOT DETECTED Final   Coronavirus NL63 NOT DETECTED NOT DETECTED Final   Coronavirus OC43 NOT DETECTED NOT DETECTED Final   Metapneumovirus NOT DETECTED NOT DETECTED Final   Rhinovirus / Enterovirus NOT DETECTED NOT DETECTED Final   Influenza A NOT DETECTED NOT DETECTED Final   Influenza B NOT DETECTED NOT DETECTED Final   Parainfluenza Virus 1 NOT DETECTED NOT DETECTED Final   Parainfluenza Virus 2 NOT DETECTED NOT DETECTED Final   Parainfluenza Virus 3 NOT DETECTED NOT DETECTED Final   Parainfluenza Virus 4 NOT DETECTED NOT DETECTED Final   Respiratory Syncytial Virus NOT DETECTED NOT DETECTED Final   Bordetella pertussis NOT DETECTED NOT DETECTED Final   Chlamydophila pneumoniae NOT DETECTED NOT DETECTED Final   Mycoplasma pneumoniae NOT DETECTED NOT DETECTED Final    Comment: Performed at [facility]    Radiology Reports Dg Chest 2 View  Result Date: 06/01/2016 CLINICAL DATA:  Confusion and weakness. History of stage IV right lower lobe lung adenocarcinoma. EXAM: CHEST  2 VIEW COMPARISON:  05/27/2016 FINDINGS: Chronic lung disease again noted. Right perihilar opacity partly corresponds to known pulmonary mass. There may be a component of adjacent atelectasis or pneumonia. No pulmonary edema or pleural fluid identified. No pneumothorax. The heart size is stable and normal. IMPRESSION: Right perihilar tumor with potential adjacent atelectasis or pneumonia. Electronically Signed   By: Aletta Edouard M.D.   On: 06/01/2016 16:09   Dg Chest 2 View  Result Date: 05/27/2016 CLINICAL DATA:  Fever with shortness of breath EXAM: CHEST  2 VIEW COMPARISON:  May 10, 2016 chest radiograph and PET-CT May 03, 2016 FINDINGS: Lungs are hyperexpanded. There is airspace consolidation in the right lower lobe in the area of known mass. The mass as a discrete structure known to be present in the right lower lobe is not evident by radiography. The lungs elsewhere are clear. No pneumothorax. Heart size is upper normal with pulmonary vascularity within normal limits. No adenopathy is appreciable by radiography. No bone lesions. IMPRESSION: Right lower lobe patchy infiltrate. Question element of hemorrhage from recent biopsy. There may also be superimposed pneumonia in the right base. The known mass in the right lower lobe region is not seen as a discrete structure by radiography. Lungs elsewhere clear. Stable cardiac silhouette. Electronically Signed   By: Lowella Grip III M.D.   On: 05/27/2016 09:47   Mr Jeri Cos TO Contrast  Result Date: 05/27/2016 CLINICAL DATA:  82 y/o  M; metastatic lung cancer. EXAM: MRI HEAD WITHOUT AND WITH CONTRAST TECHNIQUE: Multiplanar, multiecho pulse sequences of the brain and surrounding structures were obtained without and with intravenous contrast. CONTRAST:  58m MULTIHANCE GADOBENATE DIMEGLUMINE 529 MG/ML IV SOLN COMPARISON:  None. FINDINGS: Brain: No diffusion signal abnormality. Moderate brain parenchymal volume loss. Mild chronic microvascular ischemic changes in periventricular white matter and the pons. Left anterior corona radiata small no abnormal susceptibility hypointensity to indicate intracranial hemorrhage. Severe motion artifact of T1 postcontrast sequences. No gross evidence for abnormal enhancement. Chronic infarct. Vascular: Normal flow voids. Skull and upper cervical spine: Normal marrow signal. Left temporal scalp 10  mm cyst is probably sebaceous. Sinuses/Orbits: Negative.  Bilateral intra-ocular lens replacement. Other: None. IMPRESSION: 1. Extensive motion artifact of T1 postcontrast sequences. No gross evidence for abnormal enhancement. No signal abnormality on other  sequences to suggest intracranial metastatic disease. 2. Moderate brain parenchymal volume loss, mild chronic microvascular ischemic changes, and chronic left anterior corona radiata small lacunar infarct. Electronically Signed   By: Kristine Garbe M.D.   On: 05/27/2016 20:49   US Renal  Result Date: 05/24/2016 CLINICAL DATA:  Acute kidney injury.  Abnormal labs today. EXAM: RENAL / URINARY TRACT ULTRASOUND COMPLETE COMPARISON:  PET-CT 05/03/2016 FINDINGS: Right Kidney: Length: 11.8 cm. Echogenicity within normal limits. No mass or hydronephrosis visualized. Left Kidney: Length: 12.2 cm. Echogenicity is normal. No hydronephrosis. A simple cyst is identified in the lower pole region measuring 2.9 x 2.6 x 2.3 cm. Bladder: Appears normal for degree of bladder distention. Bilateral ureteral jets are visualized. IMPRESSION: 1. No hydronephrosis. 2. Simple cyst in the lower pole the left kidney. Electronically Signed   By: Nolon Nations M.D.   On: 05/24/2016 18:42   Ct Biopsy  Result Date: 05/10/2016 INDICATION: 82 year old with a suspicious nodule in the right lower lobe and multiple bone lesions. Findings are concerning for lung cancer. Tissue diagnosis is needed. EXAM: CT-GUIDED CORE BIOPSY OF RIGHT LOWER LOBE LUNG NODULE MEDICATIONS: None. ANESTHESIA/SEDATION: Moderate (conscious) sedation was employed during this procedure. A total of Versed 1.0 mg and Fentanyl 25 mcg was administered intravenously. Moderate Sedation Time: 15 minutes. The patient's level of consciousness and vital signs were monitored continuously by radiology nursing throughout the procedure under my direct supervision. FLUOROSCOPY TIME:  None COMPLICATIONS: None immediate. PROCEDURE: Informed written consent was obtained from the patient after a thorough discussion of the procedural risks, benefits and alternatives. All questions were addressed. A timeout was performed prior to the initiation of the procedure. Patient was  placed on his right side. CT images through the chest were obtained. The nodule along the posterior right lower lobe was identified. Overlying skin was cleansed with chlorhexidine and sterile field was created. Skin was anesthetized with 1% lidocaine. A 17 gauge coaxial needle was directed into the pulmonary nodule with CT guidance. Needle position confirmed within the lesion. Two core biopsies were obtained with an 18 gauge core device. Specimens placed in formalin. 56 gauge needle was removed with the BiosSentry tract sealant. 17 gauge needle removed without complication. Bandage placed over the puncture site. FINDINGS: 2.4 cm pleural-based nodule in the right lower lobe. Needle position confirmed within this lesion. No significant pneumothorax or hemorrhage following the core biopsies. IMPRESSION: Successful CT-guided core biopsies of the right lower lobe nodule. Electronically Signed   By: Markus Daft M.D.   On: 05/10/2016 16:23   Dg Chest Port 1 View  Result Date: 06/04/2016 CLINICAL DATA:  Fever and sepsis.  Right-sided lung carcinoma EXAM: PORTABLE CHEST 1 VIEW COMPARISON:  June 01, 2016 FINDINGS: Persistent right perihilar prominence is felt to be consistent with previously documented lung mass. There is new interstitial pulmonary edema. There is a new right pleural effusion. There is cardiomegaly with pulmonary venous hypertension. There is atherosclerotic calcification aorta. No adenopathy is seen radiographically. IMPRESSION: Findings indicative of congestive heart failure superimposed on chronic fibrotic type change. Soft tissue fullness in the right perihilar region is stable without progression. There is aortic atherosclerosis. Electronically Signed   By: Lowella Grip III M.D.   On: 06/04/2016 08:24   [facility] 1 View  Result  Date: 05/10/2016 CLINICAL DATA:  Status post RIGHT lung biopsy. EXAM: PORTABLE CHEST 1 VIEW COMPARISON:  Chest radiograph March 25, 2016 FINDINGS: Mildly  tortuous aorta associated with hypertension. Cardiac silhouette is normal in size. Similar prominent interstitial markings and increased lung volumes most compatible with COPD. LEFT lung base atelectasis/ scarring. No pleural effusion. No pneumothorax. Soft tissue planes and included osseous structures are nonsuspicious. IMPRESSION: No pneumothorax. COPD.  LEFT lung base atelectasis/scarring. Electronically Signed   By: Elon Alas M.D.   On: 05/10/2016 14:33    Time Spent in minutes 35   Louellen Molder M.D on 06/04/2016 at 3:12 PM  Between 7am to 7pm - Pager - [phone]  After 7pm go to [email or web address] - password [facility]  [facility] -  Office  [phone]

## 2016-06-04 NOTE — Progress Notes (Signed)
Pharmacy Antibiotic Note  Alexander Landry is a 82 y.o. male with PMH of HTN, DM, Hodgkin's lymphoma, recent diagnosis of metastatic bronchogenic adenocarcinoma admitted on 06/01/2016 with weakness and diarrhea, started empirically on PO Vancomycin per MD for possible C.diff. No further diarrhea since admission and PO Vancomycin subsequently discontinued. Patient febrile this AM and with worsening leukocytosis. Pharmacy consulted to start IV Vancomycin and Zosyn for sepsis.   Plan: Vancomycin 1g IV x 1, then '750mg'$  IV q12h. Plan for Vancomycin trough level at steady state, as indicated. Zosyn 3.375g IV x 1 over 30 minutes, then Zosyn 3.375g IV q8h (infuse over 4 hours). Monitor renal function, cultures, clinical course.  Height: '6\' 3"'$  (190.5 cm) Weight: 213 lb 3 oz (96.7 kg) IBW/kg (Calculated) : 84.5  Temp (24hrs), Avg:99.6 F (37.6 C), Min:98.1 F (36.7 C), Max:102.3 F (39.1 C)   Recent Labs Lab 05/31/16 0502 05/31/16 0856 06/01/16 1548 06/01/16 1604 06/01/16 1816 06/01/16 2112 06/02/16 0314 06/03/16 0404 06/04/16 0339 06/04/16 0820  WBC  --  21.0* 22.8*  --   --   --  16.2* 15.2* 16.0*  --   CREATININE 1.18  --  1.59*  --   --   --  1.37* 1.48* 1.52*  --   LATICACIDVEN  --   --   --  2.89* 2.1* 1.6  --   --   --  1.7    Estimated Creatinine Clearance: 45.6 mL/min (by C-G formula based on SCr of 1.52 mg/dL (H)).    No Known Allergies  Antimicrobials this admission: 11/21 >> Vancomycin (PO) >> 11/24 11/24 >> Vancomycin IV >> 11/24 >> Zosyn >>  Dose adjustments this admission: ---  Microbiology results: 11/21 BCx: NGTD 11/21 UCx: NGF   11/21 MRSA PCR: negative 11/24 BCx: sent 11/24 UCx: sent 11/24 respiratory panel: negative  Thank you for allowing pharmacy to be a part of this patient's care.  Luiz Ochoa 06/04/2016 10:26 AM

## 2016-06-04 NOTE — Evaluation (Addendum)
SLP Cancellation Note  Patient Details Name: JALONI DAVOLI MRN: [id number] DOB: [birth year]   Cancelled treatment:       Reason Eval/Treat Not Completed: Fatigue/lethargy limiting ability to participate  Pt sleeping with open mouth posture, wife reports not tolerating solids today - pt did not awaken adequately for po or evaluation.  Xray transporter arrived to transport pt for CT Abdomen.   Pt's wife reports vision deficit prevent her from ordering his meals - advised her to call and inquire to options and order from verbal options provided- only giving pt po when fully alert and tolerating.  Wrote number on board.  Will follow up for eval.    Claudie Fisherman, [facility] SLP [phone]  Addendum:  Following attempt at evaluation, RN informs me that MD wants pt to be NPO until SLP evaluation.  Pt currently not alert - will follow up next date.

## 2016-06-05 DIAGNOSIS — J690 Pneumonitis due to inhalation of food and vomit: Secondary | ICD-10-CM

## 2016-06-05 DIAGNOSIS — N320 Bladder-neck obstruction: Secondary | ICD-10-CM

## 2016-06-05 LAB — BASIC METABOLIC PANEL
ANION GAP: 8 (ref 5–15)
BUN: 30 mg/dL — ABNORMAL HIGH (ref 6–20)
CHLORIDE: 112 mmol/L — AB (ref 101–111)
CO2: 25 mmol/L (ref 22–32)
Calcium: 7.8 mg/dL — ABNORMAL LOW (ref 8.9–10.3)
Creatinine, Ser: 1.36 mg/dL — ABNORMAL HIGH (ref 0.61–1.24)
GFR calc Af Amer: 55 mL/min — ABNORMAL LOW (ref >60)
GFR, EST NON AFRICAN AMERICAN: 47 mL/min — AB (ref >60)
GLUCOSE: 106 mg/dL — AB (ref 65–99)
POTASSIUM: 4.1 mmol/L (ref 3.5–5.1)
Sodium: 145 mmol/L (ref 135–145)

## 2016-06-05 LAB — URINE CULTURE: CULTURE: NO GROWTH

## 2016-06-05 MED ORDER — FUROSEMIDE 10 MG/ML IJ SOLN
40.0000 mg | Freq: Once | INTRAMUSCULAR | Status: AC
  Administered 2016-06-05: 40 mg via INTRAVENOUS
  Filled 2016-06-05: qty 4

## 2016-06-05 NOTE — Evaluation (Signed)
Clinical/Bedside Swallow Evaluation Patient Details  Name: Alexander Landry MRN: [id number] Date of Birth: 10-22-1932  Today's Date: 06/05/2016 Time: SLP Start Time (ACUTE ONLY): 29 SLP Stop Time (ACUTE ONLY): 1615 SLP Time Calculation (min) (ACUTE ONLY): 45 min  Past Medical History:  Past Medical History:  Diagnosis Date  . Adenocarcinoma of right lung, stage 4 ([place]) 05/24/2016  . Diabetes mellitus without complication ([place])   . GERD (gastroesophageal reflux disease)   . Hypertension   . Kyphosis (acquired) (postural)   . Lung cancer ([place]) 05/10/2016   right lower lobe lung=adenocarcinoma  . Medical history non-contributory    Past Surgical History: History reviewed. No pertinent surgical history. HPI:  pt is an 82 yo male adm to [facility] with weakness, fevers, poor intake, acute kidney injury.  Also with fevers - which per MD notes may be due to known cancer.  Pt receiving palliative radiation for lung cancer, ?  cdif recently, pna/fever.   Pt spouse reports he was just released from [facility] hospital last week.  Swallow evaluation ordered due to concerns pt may be having some aspiration.  Wife denies pt having significant problems swallowing prior to admit, and if occasional issues occur, it's with solids.  Pt is not on oxygen at home. Imaging studies concerning for right lobe pna.  Imaging of abdomen also concerning for possible pancreatic mass and MRI may be indicated per notes.  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    Palliative consult planned per RN.    Assessment / Plan / Recommendation Clinical Impression  Pt fully alert today and able to feed self with cues to slow rate/take small boluses.  Wife reports his mental status is the best it has been since admission.    Voice is gurgly/wet at times - which NT reports has been present today without po intake!  Pt likely is aspirating secretions - fortunately cues to clear throat/cough and reswallow  effective to mitigate (pt can not expectorate them).    No overt indication of aspiration with soda, applesauce, nectar juice - when pt consuming small boluses of po but he needs cues.  Icecream boluses were followed by worsening wet voice, ? mixed with secretions.  Use of cue to strongly throat clear/cough helpful.    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 from lung cancer/tumor and/or secretion.  Educated pt and wife thoroughly to aspiration mitigation strategies using teach back.  Standing secretions on palate were cleared with oral suctioning and intake.  Pt did not demonstrate discernible difference in tolerance of thin vs nectar.    Will modify diet for maximal airway protection.  As wife reports pt much stronger and best mentally currently, anticipate improvement in swallow ability to  continue.     Aspiration Risk  Moderate aspiration risk    Diet Recommendation Dysphagia 3 (Mech soft);Thin liquid   Liquid Administration via: Cup;No straw;Spoon Medication Administration: Whole meds with puree Supervision: Full supervision/cueing for compensatory strategies;Patient able to self feed (educated wife to assist feeding pt) Compensations: Slow rate;Small sips/bites;Other (Comment) (clear throat and reswallow  if voice wet, stop intake if pt coughing) Postural Changes: Seated upright at 90 degrees;Remain upright for at least 30 minutes after po intake    Other  Recommendations Oral Care Recommendations: Oral care BID   Follow up Recommendations        Frequency and Duration min 1 x/week  1 week  Prognosis Prognosis for Safe Diet Advancement: Fair Barriers to Reach Goals: Severity of deficits      Swallow Study   General Date of Onset: 06/05/16 HPI: pt is an 82 yo male adm to [facility] with weakness, fevers, poor intake, acute kidney injury.  Also with fevers - which per MD notes may be due to known cancer.  Pt receiving palliative  radiation for lung cancer, ?  cdif recently, pna/fever.   Pt spouse reports he was just released from [facility] hospital last week.  Swallow evaluation ordered due to concerns pt may be having some aspiration.  Wife denies pt having significant problems swallowing prior to admit, and if occasional issues occur, it's with solids.  Pt is not on oxygen at home. Imaging studies concerning for right lobe pna.  Imaging of abdomen also concerning for possible pancreatic mass and MRI may be indicated per notes.  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    Palliative consult planned per RN.  Type of Study: Bedside Swallow Evaluation Diet Prior to this Study: Regular;NPO Temperature Spikes Noted: Yes Respiratory Status: Nasal cannula History of Recent Intubation: No Behavior/Cognition: Alert;Cooperative;Pleasant mood Oral Cavity Assessment: Excessive secretions (removed with oral suction ) Oral Care Completed by SLP: Yes Oral Cavity - Dentition: Dentures, top;Dentures, bottom Vision: Functional for self-feeding Self-Feeding Abilities: Needs assist (wife assisting patient) Patient Positioning: Upright in bed Baseline Vocal Quality: Wet (intermittently wet - clears with cued throat clearing/coughing but reoccurs) Volitional Cough: Strong Volitional Swallow: Able to elicit    Oral/Motor/Sensory Function Overall Oral Motor/Sensory Function: Generalized oral weakness   Ice Chips Ice chips: Impaired Presentation: Spoon Pharyngeal Phase Impairments: Throat Clearing - Delayed;Cough - Delayed Other Comments: cough post-swallow of multiple large boluses!  small single bolus aided tolerance   Thin Liquid Thin Liquid: Within functional limits Presentation: Cup;Spoon;Self Fed    Nectar Thick Nectar Thick Liquid: Within functional limits Presentation: Cup;Self Fed   Honey Thick Honey Thick Liquid: Not tested   Puree Puree: Impaired Other Comments: post=swallow cough noted - suspet due  to secretion retention in pharynx mixing with icecream residuals, intermittent wet voice/throat clearing = pt benefited from cue to cough strongly and re=swallow  (as he can not consistently expectorate secretions)   Solid   GO   Solid: Not tested        Luanna Salk, [facility] SLP [phone]

## 2016-06-05 NOTE — Clinical Social Work Note (Signed)
Psychosocial assessment completed by ED CSW on 11/21.   FL2 completed. MSW met with family today at bedside and family prefers 1. [place] and 2. [place].   Awaiting bed offers from preferred SNF's. Facilities notified of referrals.   MSW remains available as needed.   Glendon Axe, MSW [phone] 06/05/2016 2:51 PM

## 2016-06-05 NOTE — Progress Notes (Signed)
PROGRESS NOTE                                                                                                                                                                                                             Patient Demographics:    Alexander Landry, is a 82 y.o. male, DOB - Sep 27, 1932, KZL:[id number]  Admit date - 06/01/2016   Admitting Physician Albertine Patricia, MD  Outpatient Primary MD for the patient is [name], MD  LOS - 4  Outpatient Specialists: Rad onc Dr Julien Nordmann   Chief Complaint  Patient presents with  . Weakness  . Diarrhea       Brief Narrative   82 year old male with hypertension, diabetes mellitus, Hodgkin's lymphoma status post systemic chemotherapy about 20 years back, recently diagnosed metastatic bronchogenic adenocarcinoma, hospitalized at [facility] with acute renal failure and pneumonia and ongoing fever (felt secondary to cancer) and was discharged home on 11/20. He was started on palliative radiation during the hospitalization. He was sent to the ED after completion of radiation with generalized weakness and 2 episodes of watery diarrhea. Reported subjective fevers and chills. Denied chest pain, cough, shortness of breath, abdominal pain, nausea, vomiting, dysuria or blood per rectum.  Patient was septic with fever of 100.32F, hypotensive with blood pressure of 87/65 mmHg, hypoxic and tachypnea. Blood work showed leukocytosis with WBC of 16.2, potassium of 3.4, AKi with creatinine 1.37 and elevated lactic acid of 2.89. Sepsis pathway initiated. Cultures negative. Appeared to have improved but became septic again on 11/24.   Subjective:   Foley placed in for bladder distention and mild hydronephrosis seen on CT. Had good urine output.   Assessment  & Plan :   Principal problem:  Sepsis ([place]) Recurrent symptoms. Sepsis pathway initiated. Gentle hydration. Repeated  blood cultures sent. Started on empiric IV vancomycin and Zosyn. Discontinue oral vancomycin. No further diarrhea. -Aspiration is a possible concern. SLP eval pending. Keep nothing by mouth. CT of the abdomen and pelvis showing right lower lobe pneumonia with mild effusion. Also showed bilateral moderate hydroureteronephrosis without obstruction. -Afebrile this morning. Vitals table. Follow culture.     Active Problems:  Adenocarcinoma of right lung, stage 4 ([place]), with bony metastases. -Currently on palliative radiation.  Does not appear a good candidate for chemotherapy, especially with significant decline in function recently. Dr. Julien Nordmann has sent the biopsy for molecular study to see for any treatment options. Discussed at length with wife and she does not want him to undergo any heroic  measures, wishes him to be DO NOT RESUSCITATE. Discussed options of hospice. She agrees to speak with palliative care for symptom management and further goals of care. -Pain control with scheduled Toradol every 8 hours. (Patient appears comfortable). -Dr. Julien Nordmann should be contacted on Monday to further decide on treatment options.  Diarrhea No symptoms since admission. Discontinue oral vancomycin.  Acute kidney injury secondary to severe dehydration/sepsis. I believe bladder outlet obstruction also playing a role. Improved with Foley placement. Gentle hydration     Protein-calorie malnutrition, severe Nothing by mouth for now. Resume supplements once able to take by mouth.   Acute encephalopathy I suspect this is due to sepsis. Recent MRI was unremarkable. As per family, he does have some baseline dementia.  Diabetes mellitus type 2 Holding metformin and Actos. Monitor with sliding scale coverage.  Hypotension On admission, secondary to sepsis. Holding home medications. Better now.  Hypokalemia/hypomagnesemia with PVCs. Replenished.  Bladder outlet obstruction Foley placed on 11/24.  Add Flomax once able to take by mouth  Code Status : DO NOT RESUSCITATE  Family Communication  : Wife at bedside  Disposition Plan  : SNF versus residential hospice depending upon goals of care discussion with palliative care and Dr. Julien Nordmann.  Barriers For Discharge : Active symptoms  Consults  :   Radiation oncology Palliative care  Procedures  : CT abdomen and pelvis  DVT Prophylaxis  :  Lovenox   Lab Results  Component Value Date   PLT 349 06/04/2016    Antibiotics  :    Anti-infectives    Start     Dose/Rate Route Frequency Ordered Stop   06/04/16 2000  vancomycin (VANCOCIN) IVPB 750 mg/150 ml premix     750 mg 150 mL/hr over 60 Minutes Intravenous Every 12 hours 06/04/16 1026     06/04/16 1400  piperacillin-tazobactam (ZOSYN) IVPB 3.375 g     3.375 g 12.5 mL/hr over 240 Minutes Intravenous Every 8 hours 06/04/16 0844     06/04/16 0800  piperacillin-tazobactam (ZOSYN) IVPB 3.375 g     3.375 g 100 mL/hr over 30 Minutes Intravenous  Once 06/04/16 0746 06/04/16 0915   06/04/16 0800  vancomycin (VANCOCIN) IVPB 1000 mg/200 mL premix     1,000 mg 200 mL/hr over 60 Minutes Intravenous  Once 06/04/16 0746 06/04/16 1014   06/01/16 1815  vancomycin (VANCOCIN) 50 mg/mL oral solution 125 mg  Status:  Discontinued     125 mg Oral Every 6 hours 06/01/16 1813 06/04/16 1244        Objective:   Vitals:   06/04/16 1114 06/04/16 1400 06/04/16 2054 06/05/16 0537  BP: 128/72 140/70 117/63 135/66  Pulse: (!) 114 (!) 120 93 99  Resp: (!) 22 (!) 22 20 (!) 22  Temp: 99.3 F (37.4 C) 99.2 F (37.3 C) 99 F (37.2 C) 97.6 F (36.4 C)  TempSrc: Axillary Axillary Oral Oral  SpO2: 94% 95% 100% 94%  Weight:      Height:        Wt Readings from Last 3 Encounters:  06/02/16 96.7 kg (213 lb 3 oz)  05/31/16 89.2 kg (196 lb 10.4 oz)  05/24/16 86.9 kg (191 lb 8 oz)     Intake/Output Summary (Last 24 hours) at 06/05/16 1151 Last data filed at 06/05/16 1030  Gross per 24 hour    Intake          1968.75 ml  Output             5650 ml  Net         -3681.25 ml     Physical Exam  Gen: Sleepy, confused HEENT:  dry mucosa, supple neck Chest: clear b/l, no added sounds CVS: N S1&S2, no murmurs GI: soft, mild distention, nontender, bowel sounds present, Foley+ Musculoskeletal: warm, no edema CNS: Alert and oriented 0    Data Review:    CBC  Recent Labs Lab 05/30/16 0825 05/31/16 0856 06/01/16 1548 06/02/16 0314 06/03/16 0404 06/04/16 0339  WBC 16.3* 21.0* 22.8* 16.2* 15.2* 16.0*  HGB 11.8* 10.5* 12.1* 10.4* 9.8* 10.3*  HCT 35.3* 32.3* 37.1* 31.6* 29.6* 31.6*  PLT 237 313 318 289 295 349  MCV 95.9 97.0 96.9 98.1 95.8 96.0  MCH 32.1 31.5 31.6 32.3 31.7 31.3  MCHC 33.4 32.5 32.6 32.9 33.1 32.6  RDW 13.6 14.3 14.0 14.1 14.0 14.0  LYMPHSABS 2.5 3.0 3.3  --   --   --   MONOABS 2.2* 2.7* 2.9*  --   --   --   EOSABS 0.5 0.4 0.4  --   --   --   BASOSABS 0.1 0.1 0.1  --   --   --     Chemistries   Recent Labs Lab 06/01/16 1548 06/02/16 0314 06/03/16 0404 06/04/16 0339 06/05/16 0616  NA 141 139 139 138 145  K 4.0 3.4* 4.0 4.1 4.1  CL 99* 103 104 106 112*  CO2 '30 27 26 24 25  '$ GLUCOSE 155* 168* 162* 146* 106*  BUN 48* 37* 40* 39* 30*  CREATININE 1.59* 1.37* 1.48* 1.52* 1.36*  CALCIUM 8.9 7.8* 8.0* 8.2* 7.8*  MG  --  1.3* 2.3  --   --   AST 27  --   --   --   --   ALT 39  --   --   --   --   ALKPHOS 123  --   --   --   --   BILITOT 0.9  --   --   --   --    ------------------------------------------------------------------------------------------------------------------ No results for input(s): CHOL, HDL, LDLCALC, TRIG, CHOLHDL, LDLDIRECT in the last 72 hours.  Lab Results  Component Value Date   HGBA1C 6.8 (H) 05/25/2016   ------------------------------------------------------------------------------------------------------------------  Recent Labs  06/03/16 1359  TSH 0.460    ------------------------------------------------------------------------------------------------------------------  Recent Labs  06/03/16 1359  VITAMINB12 423    Coagulation profile  Recent Labs Lab 06/04/16 0820  INR 1.07    No results for input(s): DDIMER in the last 72 hours.  Cardiac Enzymes No results for input(s): CKMB, TROPONINI, MYOGLOBIN in the last 168 hours.  Invalid input(s): CK ------------------------------------------------------------------------------------------------------------------ No results found for: BNP  Inpatient Medications  Scheduled Meds: . dronabinol  2.5 mg Oral BID  . feeding supplement (ENSURE ENLIVE)  237 mL Oral TID BM  . heparin  5,000 Units Subcutaneous Q8H  . insulin aspart  0-9 Units Subcutaneous TID WC  . ketorolac  15 mg Intravenous Q8H  . mirtazapine  15 mg Oral QHS  . multivitamin with minerals  1 tablet Oral Daily  . piperacillin-tazobactam (ZOSYN)  IV  3.375 g Intravenous Q8H  .  potassium chloride  20 mEq Oral Daily  . simvastatin  20 mg Oral q1800  . vancomycin  750 mg Intravenous Q12H   Continuous Infusions: . sodium chloride 50 mL/hr at 06/05/16 1148   PRN Meds:.acetaminophen, acetaminophen, albuterol, HYDROcodone-acetaminophen, iopamidol, ondansetron **OR** ondansetron (ZOFRAN) IV  Micro Results Recent Results (from the past 240 hour(s))  Culture, blood (routine x 2) Call MD if unable to obtain prior to antibiotics being given     Status: None   Collection Time: 05/27/16 11:23 AM  Result Value Ref Range Status   Specimen Description BLOOD LEFT ARM  Final   Special Requests IN PEDIATRIC BOTTLE [place]  Final   Culture NO GROWTH 5 DAYS  Final   Report Status 06/01/2016 FINAL  Final  Culture, blood (routine x 2) Call MD if unable to obtain prior to antibiotics being given     Status: None   Collection Time: 05/27/16 11:28 AM  Result Value Ref Range Status   Specimen Description BLOOD LEFT HAND  Final   Special  Requests IN PEDIATRIC BOTTLE 3CC  Final   Culture NO GROWTH 5 DAYS  Final   Report Status 06/01/2016 FINAL  Final  Urine culture     Status: Abnormal   Collection Time: 05/27/16  1:24 PM  Result Value Ref Range Status   Specimen Description URINE, CLEAN CATCH  Final   Special Requests NONE  Final   Culture >=100,000 COLONIES/mL ESCHERICHIA COLI (A)  Final   Report Status 05/29/2016 FINAL  Final   Organism ID, Bacteria ESCHERICHIA COLI (A)  Final      Susceptibility   Escherichia coli - MIC*    AMPICILLIN <=2 SENSITIVE Sensitive     CEFAZOLIN <=4 SENSITIVE Sensitive     CEFTRIAXONE <=1 SENSITIVE Sensitive     CIPROFLOXACIN <=0.25 SENSITIVE Sensitive     GENTAMICIN <=1 SENSITIVE Sensitive     IMIPENEM <=0.25 SENSITIVE Sensitive     NITROFURANTOIN <=16 SENSITIVE Sensitive     TRIMETH/SULFA <=20 SENSITIVE Sensitive     AMPICILLIN/SULBACTAM <=2 SENSITIVE Sensitive     PIP/TAZO <=4 SENSITIVE Sensitive     Extended ESBL NEGATIVE Sensitive     * >=100,000 COLONIES/mL ESCHERICHIA COLI  Urine culture     Status: None   Collection Time: 05/28/16  2:12 PM  Result Value Ref Range Status   Specimen Description URINE, RANDOM  Final   Special Requests NONE  Final   Culture NO GROWTH  Final   Report Status 05/29/2016 FINAL  Final  Urine culture     Status: None   Collection Time: 06/01/16  3:40 PM  Result Value Ref Range Status   Specimen Description URINE, CLEAN CATCH  Final   Special Requests NONE  Final   Culture NO GROWTH Performed at [facility]   Final   Report Status 06/03/2016 FINAL  Final  Culture, blood (routine x 2)     Status: None (Preliminary result)   Collection Time: 06/01/16  3:50 PM  Result Value Ref Range Status   Specimen Description BLOOD LEFT ANTECUBITAL  Final   Special Requests BOTTLES DRAWN AEROBIC AND ANAEROBIC 5CC  Final   Culture   Final    NO GROWTH 3 DAYS Performed at [facility]    Report Status PENDING  Incomplete  Culture, blood  (routine x 2)     Status: None (Preliminary result)   Collection Time: 06/01/16  4:12 PM  Result Value Ref Range Status   Specimen Description BLOOD  RIGHT ANTECUBITAL  Final   Special Requests BOTTLES DRAWN AEROBIC AND ANAEROBIC 5CC  Final   Culture   Final    NO GROWTH 3 DAYS Performed at [facility]    Report Status PENDING  Incomplete  MRSA PCR Screening     Status: None   Collection Time: 06/01/16  7:00 PM  Result Value Ref Range Status   MRSA by PCR NEGATIVE NEGATIVE Final    Comment:        The GeneXpert MRSA Assay (FDA approved for NASAL specimens only), is one component of a comprehensive MRSA colonization surveillance program. It is not intended to diagnose MRSA infection nor to guide or monitor treatment for MRSA infections.   Respiratory Panel by PCR     Status: None   Collection Time: 06/04/16  9:22 AM  Result Value Ref Range Status   Adenovirus NOT DETECTED NOT DETECTED Final   Coronavirus 229E NOT DETECTED NOT DETECTED Final   Coronavirus HKU1 NOT DETECTED NOT DETECTED Final   Coronavirus NL63 NOT DETECTED NOT DETECTED Final   Coronavirus OC43 NOT DETECTED NOT DETECTED Final   Metapneumovirus NOT DETECTED NOT DETECTED Final   Rhinovirus / Enterovirus NOT DETECTED NOT DETECTED Final   Influenza A NOT DETECTED NOT DETECTED Final   Influenza B NOT DETECTED NOT DETECTED Final   Parainfluenza Virus 1 NOT DETECTED NOT DETECTED Final   Parainfluenza Virus 2 NOT DETECTED NOT DETECTED Final   Parainfluenza Virus 3 NOT DETECTED NOT DETECTED Final   Parainfluenza Virus 4 NOT DETECTED NOT DETECTED Final   Respiratory Syncytial Virus NOT DETECTED NOT DETECTED Final   Bordetella pertussis NOT DETECTED NOT DETECTED Final   Chlamydophila pneumoniae NOT DETECTED NOT DETECTED Final   Mycoplasma pneumoniae NOT DETECTED NOT DETECTED Final    Comment: Performed at [facility]  Urine culture     Status: None   Collection Time: 06/04/16  9:52 AM  Result Value  Ref Range Status   Specimen Description URINE, CLEAN CATCH  Final   Special Requests Immunocompromised  Final   Culture NO GROWTH Performed at [facility]   Final   Report Status 06/05/2016 FINAL  Final    Radiology Reports Dg Chest 2 View  Result Date: 06/01/2016 CLINICAL DATA:  Confusion and weakness. History of stage IV right lower lobe lung adenocarcinoma. EXAM: CHEST  2 VIEW COMPARISON:  05/27/2016 FINDINGS: Chronic lung disease again noted. Right perihilar opacity partly corresponds to known pulmonary mass. There may be a component of adjacent atelectasis or pneumonia. No pulmonary edema or pleural fluid identified. No pneumothorax. The heart size is stable and normal. IMPRESSION: Right perihilar tumor with potential adjacent atelectasis or pneumonia. Electronically Signed   By: Aletta Edouard M.D.   On: 06/01/2016 16:09   Dg Chest 2 View  Result Date: 05/27/2016 CLINICAL DATA:  Fever with shortness of breath EXAM: CHEST  2 VIEW COMPARISON:  May 10, 2016 chest radiograph and PET-CT May 03, 2016 FINDINGS: Lungs are hyperexpanded. There is airspace consolidation in the right lower lobe in the area of known mass. The mass as a discrete structure known to be present in the right lower lobe is not evident by radiography. The lungs elsewhere are clear. No pneumothorax. Heart size is upper normal with pulmonary vascularity within normal limits. No adenopathy is appreciable by radiography. No bone lesions. IMPRESSION: Right lower lobe patchy infiltrate. Question element of hemorrhage from recent biopsy. There may also be superimposed pneumonia in the right base.  The known mass in the right lower lobe region is not seen as a discrete structure by radiography. Lungs elsewhere clear. Stable cardiac silhouette. Electronically Signed   By: Lowella Grip III M.D.   On: 05/27/2016 09:47   Mr Jeri Cos XL Contrast  Result Date: 05/27/2016 CLINICAL DATA:  82 y/o  M; metastatic lung  cancer. EXAM: MRI HEAD WITHOUT AND WITH CONTRAST TECHNIQUE: Multiplanar, multiecho pulse sequences of the brain and surrounding structures were obtained without and with intravenous contrast. CONTRAST:  41m MULTIHANCE GADOBENATE DIMEGLUMINE 529 MG/ML IV SOLN COMPARISON:  None. FINDINGS: Brain: No diffusion signal abnormality. Moderate brain parenchymal volume loss. Mild chronic microvascular ischemic changes in periventricular white matter and the pons. Left anterior corona radiata small no abnormal susceptibility hypointensity to indicate intracranial hemorrhage. Severe motion artifact of T1 postcontrast sequences. No gross evidence for abnormal enhancement. Chronic infarct. Vascular: Normal flow voids. Skull and upper cervical spine: Normal marrow signal. Left temporal scalp 10 mm cyst is probably sebaceous. Sinuses/Orbits: Negative.  Bilateral intra-ocular lens replacement. Other: None. IMPRESSION: 1. Extensive motion artifact of T1 postcontrast sequences. No gross evidence for abnormal enhancement. No signal abnormality on other sequences to suggest intracranial metastatic disease. 2. Moderate brain parenchymal volume loss, mild chronic microvascular ischemic changes, and chronic left anterior corona radiata small lacunar infarct. Electronically Signed   By: LKristine GarbeM.D.   On: 05/27/2016 20:49   Ct Abdomen Pelvis W Contrast  Result Date: 06/04/2016 CLINICAL DATA:  Sepsis. EXAM: CT ABDOMEN AND PELVIS WITH CONTRAST TECHNIQUE: Multidetector CT imaging of the abdomen and pelvis was performed using the standard protocol following bolus administration of intravenous contrast. CONTRAST:  1050mISOVUE-300 IOPAMIDOL (ISOVUE-300) INJECTION 61% COMPARISON:  CT scan of June 28, 2005. FINDINGS: Lower chest: Mild left posterior basilar subsegmental atelectasis is noted. Mild right pleural effusion is noted with adjacent atelectasis or pneumonia. Hepatobiliary: Status post cholecystectomy.  Normal  liver. Pancreas: 2.5 x 1.9 cm possible solid mass is noted inferiorly in pancreatic head. Spleen: Normal. Adrenals/Urinary Tract: Adrenal glands appear normal. Moderate bilateral hydroureteronephrosis is noted without definite evidence of obstructing calculus. Mild urinary bladder distention is noted. Stomach/Bowel: There is no evidence of bowel obstruction. Vascular/Lymphatic: Atherosclerosis of abdominal aorta is noted without aneurysm formation. No significant adenopathy is noted. Reproductive: Normal prostate. Other: Mild anasarca is noted. Musculoskeletal: Moderate degenerative disc disease is noted at L5-S1. IMPRESSION: Aortic atherosclerosis. Right lower lobe pneumonia or atelectasis is noted with mild associated pleural effusion. Moderate bilateral hydroureteronephrosis is noted without definite obstructing calculus. Mild urinary bladder distention is noted and these findings are concerning for bladder outlet obstruction. Mild anasarca. 2.5 cm possible solid mass seen in pancreatic head. MRI is recommended for further evaluation on nonemergent basis. Electronically Signed   By: JaMarijo ConceptionM.D.   On: 06/04/2016 15:42   [place]  Result Date: 05/24/2016 CLINICAL DATA:  Acute kidney injury.  Abnormal labs today. EXAM: RENAL / URINARY TRACT ULTRASOUND COMPLETE COMPARISON:  PET-CT 05/03/2016 FINDINGS: Right Kidney: Length: 11.8 cm. Echogenicity within normal limits. No mass or hydronephrosis visualized. Left Kidney: Length: 12.2 cm. Echogenicity is normal. No hydronephrosis. A simple cyst is identified in the lower pole region measuring 2.9 x 2.6 x 2.3 cm. Bladder: Appears normal for degree of bladder distention. Bilateral ureteral jets are visualized. IMPRESSION: 1. No hydronephrosis. 2. Simple cyst in the lower pole the left kidney. Electronically Signed   By: ElNolon Nations.D.   On: 05/24/2016 18:42   Ct Biopsy  Result Date: 05/10/2016 INDICATION: 82 year old with a suspicious nodule in the  right lower lobe and multiple bone lesions. Findings are concerning for lung cancer. Tissue diagnosis is needed. EXAM: CT-GUIDED CORE BIOPSY OF RIGHT LOWER LOBE LUNG NODULE MEDICATIONS: None. ANESTHESIA/SEDATION: Moderate (conscious) sedation was employed during this procedure. A total of Versed 1.0 mg and Fentanyl 25 mcg was administered intravenously. Moderate Sedation Time: 15 minutes. The patient's level of consciousness and vital signs were monitored continuously by radiology nursing throughout the procedure under my direct supervision. FLUOROSCOPY TIME:  None COMPLICATIONS: None immediate. PROCEDURE: Informed written consent was obtained from the patient after a thorough discussion of the procedural risks, benefits and alternatives. All questions were addressed. A timeout was performed prior to the initiation of the procedure. Patient was placed on his right side. CT images through the chest were obtained. The nodule along the posterior right lower lobe was identified. Overlying skin was cleansed with chlorhexidine and sterile field was created. Skin was anesthetized with 1% lidocaine. A 17 gauge coaxial needle was directed into the pulmonary nodule with CT guidance. Needle position confirmed within the lesion. Two core biopsies were obtained with an 18 gauge core device. Specimens placed in formalin. 35 gauge needle was removed with the BiosSentry tract sealant. 17 gauge needle removed without complication. Bandage placed over the puncture site. FINDINGS: 2.4 cm pleural-based nodule in the right lower lobe. Needle position confirmed within this lesion. No significant pneumothorax or hemorrhage following the core biopsies. IMPRESSION: Successful CT-guided core biopsies of the right lower lobe nodule. Electronically Signed   By: Markus Daft M.D.   On: 05/10/2016 16:23   Dg Chest Port 1 View  Result Date: 06/04/2016 CLINICAL DATA:  Fever and sepsis.  Right-sided lung carcinoma EXAM: PORTABLE CHEST 1 VIEW  COMPARISON:  June 01, 2016 FINDINGS: Persistent right perihilar prominence is felt to be consistent with previously documented lung mass. There is new interstitial pulmonary edema. There is a new right pleural effusion. There is cardiomegaly with pulmonary venous hypertension. There is atherosclerotic calcification aorta. No adenopathy is seen radiographically. IMPRESSION: Findings indicative of congestive heart failure superimposed on chronic fibrotic type change. Soft tissue fullness in the right perihilar region is stable without progression. There is aortic atherosclerosis. Electronically Signed   By: Lowella Grip III M.D.   On: 06/04/2016 08:24   Dg Chest Port 1 View  Result Date: 05/10/2016 CLINICAL DATA:  Status post RIGHT lung biopsy. EXAM: PORTABLE CHEST 1 VIEW COMPARISON:  Chest radiograph March 25, 2016 FINDINGS: Mildly tortuous aorta associated with hypertension. Cardiac silhouette is normal in size. Similar prominent interstitial markings and increased lung volumes most compatible with COPD. LEFT lung base atelectasis/ scarring. No pleural effusion. No pneumothorax. Soft tissue planes and included osseous structures are nonsuspicious. IMPRESSION: No pneumothorax. COPD.  LEFT lung base atelectasis/scarring. Electronically Signed   By: Elon Alas M.D.   On: 05/10/2016 14:33    Time Spent in minutes 35   Louellen Molder M.D on 06/05/2016 at 11:51 AM  Between 7am to 7pm - Pager - [phone]  After 7pm go to [email or web address] - password [facility]  [facility] -  Office  [phone]

## 2016-06-06 LAB — CBC
HCT: 30.5 % — ABNORMAL LOW (ref 39.0–52.0)
Hemoglobin: 10 g/dL — ABNORMAL LOW (ref 13.0–17.0)
MCH: 32.1 pg (ref 26.0–34.0)
MCHC: 32.8 g/dL (ref 30.0–36.0)
MCV: 97.8 fL (ref 78.0–100.0)
PLATELETS: 429 10*3/uL — AB (ref 150–400)
RBC: 3.12 MIL/uL — AB (ref 4.22–5.81)
RDW: 14.5 % (ref 11.5–15.5)
WBC: 11.8 10*3/uL — ABNORMAL HIGH (ref 4.0–10.5)

## 2016-06-06 LAB — CULTURE, BLOOD (ROUTINE X 2)
CULTURE: NO GROWTH
CULTURE: NO GROWTH

## 2016-06-06 LAB — BASIC METABOLIC PANEL
Anion gap: 9 (ref 5–15)
BUN: 24 mg/dL — AB (ref 6–20)
CO2: 27 mmol/L (ref 22–32)
CREATININE: 1.04 mg/dL (ref 0.61–1.24)
Calcium: 7.7 mg/dL — ABNORMAL LOW (ref 8.9–10.3)
Chloride: 109 mmol/L (ref 101–111)
GFR calc Af Amer: >60 mL/min (ref >60)
GLUCOSE: 146 mg/dL — AB (ref 65–99)
POTASSIUM: 3.3 mmol/L — AB (ref 3.5–5.1)
SODIUM: 145 mmol/L (ref 135–145)

## 2016-06-06 MED ORDER — POTASSIUM CHLORIDE 20 MEQ/15ML (10%) PO SOLN
40.0000 meq | Freq: Once | ORAL | Status: AC
  Administered 2016-06-06: 40 meq via ORAL
  Filled 2016-06-06: qty 30

## 2016-06-06 NOTE — Progress Notes (Addendum)
PROGRESS NOTE                                                                                                                                                                                                             Patient Demographics:    Alexander Landry, is a 82 y.o. male, DOB - Aug 07, 1932, ZHG:[id number]  Admit date - 06/01/2016   Admitting Physician Albertine Patricia, MD  Outpatient Primary MD for the patient is [name], MD  LOS - 5  Outpatient Specialists: Rad onc Dr Julien Nordmann   Chief Complaint  Patient presents with  . Weakness  . Diarrhea       Brief Narrative   82 year old male with hypertension, diabetes mellitus, Hodgkin's lymphoma status post systemic chemotherapy about 20 years back, recently diagnosed metastatic bronchogenic adenocarcinoma, hospitalized at [facility] with acute renal failure and pneumonia and ongoing fever (felt secondary to cancer) and was discharged home on 11/20. He was started on palliative radiation during the hospitalization. He was sent to the ED after completion of radiation with generalized weakness and 2 episodes of watery diarrhea. Reported subjective fevers and chills. Denied chest pain, cough, shortness of breath, abdominal pain, nausea, vomiting, dysuria or blood per rectum.  Patient was septic with fever of 100.58F, hypotensive with blood pressure of 87/65 mmHg, hypoxic and tachypnea. Blood work showed leukocytosis with WBC of 16.2, potassium of 3.4, AKi with creatinine 1.37 and elevated lactic acid of 2.89. Sepsis pathway initiated. Cultures negative. Appeared to have improved but became septic again on 11/24.   Subjective:   Patient remains afebrile. Much awake today and better oriented. Has been tolerating dysphagia diet.   Assessment  & Plan :   Principal problem:  Sepsis ([place]) Suspected due to aspiration pneumonia. Repeated blood cultures  negative. D/c vancomycin, continue zosyn only. CT of the abdomen and pelvis showing right lower lobe pneumonia with mild effusion. Also showed bilateral moderate hydroureteronephrosis without obstruction. -Sepsis now resolved. Narrow antibiotics tomorrow if stable.     Active Problems:    Adenocarcinoma of right lung, stage 4 ([place]), with bony metastases. -Currently on palliative radiation.  Does not appear a good candidate  for chemotherapy, especially with significant decline in function recently. Dr. Julien Nordmann has sent the biopsy for molecular study to see for any treatment options. Discussed at length with wife and she does not want him to undergo any heroic  measures, wishes him to be DO NOT RESUSCITATE. Discussed options of hospice. She agrees to speak with palliative care for symptom management and further goals of care. -Pain control with scheduled Toradol every 8 hours. (Patient appears comfortable). -Please contact Dr. Julien Nordmann on Monday to further decide on treatment options.  Diarrhea No symptoms since admission. Discontinue oral vancomycin.  Acute kidney injury secondary to severe dehydration/sepsis and bladder outlet obstruction. Improved with Foley placement.     Protein-calorie malnutrition, severe Nutrition supplement.   Acute encephalopathy Secondary to sepsis. Recent MRI unremarkable. As per family he does have baseline dementia. Symptoms improving.  Diabetes mellitus type 2 Holding metformin and Actos. Monitor with sliding scale coverage.  Hypotension On admission, secondary to sepsis. Holding home medications. Better now.  Hypokalemia/hypomagnesemia with PVCs. Replenished.  Bladder outlet obstruction Foley placed on 11/24. Add Flomax.  Code Status : DO NOT RESUSCITATE  Family Communication  : Wife at bedside  Disposition Plan  : SNF versus residential hospice depending upon goals of care discussion with palliative care and Dr. Julien Nordmann.  Barriers For  Discharge : Active symptoms  Consults  :   Radiation oncology Palliative care  Procedures  : CT abdomen and pelvis  DVT Prophylaxis  :  Lovenox   Lab Results  Component Value Date   PLT 429 (H) 06/06/2016    Antibiotics  :    Anti-infectives    Start     Dose/Rate Route Frequency Ordered Stop   06/04/16 2000  vancomycin (VANCOCIN) IVPB 750 mg/150 ml premix     750 mg 150 mL/hr over 60 Minutes Intravenous Every 12 hours 06/04/16 1026     06/04/16 1400  piperacillin-tazobactam (ZOSYN) IVPB 3.375 g     3.375 g 12.5 mL/hr over 240 Minutes Intravenous Every 8 hours 06/04/16 0844     06/04/16 0800  piperacillin-tazobactam (ZOSYN) IVPB 3.375 g     3.375 g 100 mL/hr over 30 Minutes Intravenous  Once 06/04/16 0746 06/04/16 0915   06/04/16 0800  vancomycin (VANCOCIN) IVPB 1000 mg/200 mL premix     1,000 mg 200 mL/hr over 60 Minutes Intravenous  Once 06/04/16 0746 06/04/16 1014   06/01/16 1815  vancomycin (VANCOCIN) 50 mg/mL oral solution 125 mg  Status:  Discontinued     125 mg Oral Every 6 hours 06/01/16 1813 06/04/16 1244        Objective:   Vitals:   06/05/16 1600 06/05/16 2232 06/06/16 0501 06/06/16 0536  BP: 129/68 130/81 140/81   Pulse: (!) 119 (!) 113 (!) 104   Resp: 20 20 (!) 28 20  Temp: 98 F (36.7 C) 98 F (36.7 C) 98.2 F (36.8 C)   TempSrc: Axillary Oral Oral   SpO2: 95% 94% 92%   Weight:      Height:        Wt Readings from Last 3 Encounters:  06/02/16 96.7 kg (213 lb 3 oz)  05/31/16 89.2 kg (196 lb 10.4 oz)  05/24/16 86.9 kg (191 lb 8 oz)     Intake/Output Summary (Last 24 hours) at 06/06/16 1252 Last data filed at 06/06/16 0600  Gross per 24 hour  Intake             1420 ml  Output  2650 ml  Net            -1230 ml     Physical Exam  TMH:DQQIWL arousable, oriented to place and person today. HEENT:  Moist, supple neck Chest: clear b/l, no added sounds CVS: N S1&S2, no murmurs GI: soft, nondistended, nontender, bowel sounds  present, Foley+ Musculoskeletal: warm, no edema CNS: Alert and oriented 1-2    Data Review:    CBC  Recent Labs Lab 05/31/16 0856 06/01/16 1548 06/02/16 0314 06/03/16 0404 06/04/16 0339 06/06/16 0540  WBC 21.0* 22.8* 16.2* 15.2* 16.0* 11.8*  HGB 10.5* 12.1* 10.4* 9.8* 10.3* 10.0*  HCT 32.3* 37.1* 31.6* 29.6* 31.6* 30.5*  PLT 313 318 289 295 349 429*  MCV 97.0 96.9 98.1 95.8 96.0 97.8  MCH 31.5 31.6 32.3 31.7 31.3 32.1  MCHC 32.5 32.6 32.9 33.1 32.6 32.8  RDW 14.3 14.0 14.1 14.0 14.0 14.5  LYMPHSABS 3.0 3.3  --   --   --   --   MONOABS 2.7* 2.9*  --   --   --   --   EOSABS 0.4 0.4  --   --   --   --   BASOSABS 0.1 0.1  --   --   --   --     Chemistries   Recent Labs Lab 06/01/16 1548 06/02/16 0314 06/03/16 0404 06/04/16 0339 06/05/16 0616 06/06/16 0540  NA 141 139 139 138 145 145  K 4.0 3.4* 4.0 4.1 4.1 3.3*  CL 99* 103 104 106 112* 109  CO2 '30 27 26 24 25 27  '$ GLUCOSE 155* 168* 162* 146* 106* 146*  BUN 48* 37* 40* 39* 30* 24*  CREATININE 1.59* 1.37* 1.48* 1.52* 1.36* 1.04  CALCIUM 8.9 7.8* 8.0* 8.2* 7.8* 7.7*  MG  --  1.3* 2.3  --   --   --   AST 27  --   --   --   --   --   ALT 39  --   --   --   --   --   ALKPHOS 123  --   --   --   --   --   BILITOT 0.9  --   --   --   --   --    ------------------------------------------------------------------------------------------------------------------ No results for input(s): CHOL, HDL, LDLCALC, TRIG, CHOLHDL, LDLDIRECT in the last 72 hours.  Lab Results  Component Value Date   HGBA1C 6.8 (H) 05/25/2016   ------------------------------------------------------------------------------------------------------------------  Recent Labs  06/03/16 1359  TSH 0.460   ------------------------------------------------------------------------------------------------------------------  Recent Labs  06/03/16 1359  VITAMINB12 423    Coagulation profile  Recent Labs Lab 06/04/16 0820  INR 1.07    No  results for input(s): DDIMER in the last 72 hours.  Cardiac Enzymes No results for input(s): CKMB, TROPONINI, MYOGLOBIN in the last 168 hours.  Invalid input(s): CK ------------------------------------------------------------------------------------------------------------------ No results found for: BNP  Inpatient Medications  Scheduled Meds: . dronabinol  2.5 mg Oral BID  . feeding supplement (ENSURE ENLIVE)  237 mL Oral TID BM  . heparin  5,000 Units Subcutaneous Q8H  . insulin aspart  0-9 Units Subcutaneous TID WC  . ketorolac  15 mg Intravenous Q8H  . mirtazapine  15 mg Oral QHS  . multivitamin with minerals  1 tablet Oral Daily  . piperacillin-tazobactam (ZOSYN)  IV  3.375 g Intravenous Q8H  . potassium chloride  20 mEq Oral Daily  . simvastatin  20 mg Oral q1800  . vancomycin  750 mg Intravenous Q12H   Continuous Infusions: . sodium chloride 50 mL/hr at 06/06/16 0913   PRN Meds:.acetaminophen, acetaminophen, albuterol, HYDROcodone-acetaminophen, iopamidol, ondansetron **OR** ondansetron (ZOFRAN) IV  Micro Results Recent Results (from the past 240 hour(s))  Urine culture     Status: Abnormal   Collection Time: 05/27/16  1:24 PM  Result Value Ref Range Status   Specimen Description URINE, CLEAN CATCH  Final   Special Requests NONE  Final   Culture >=100,000 COLONIES/mL ESCHERICHIA COLI (A)  Final   Report Status 05/29/2016 FINAL  Final   Organism ID, Bacteria ESCHERICHIA COLI (A)  Final      Susceptibility   Escherichia coli - MIC*    AMPICILLIN <=2 SENSITIVE Sensitive     CEFAZOLIN <=4 SENSITIVE Sensitive     CEFTRIAXONE <=1 SENSITIVE Sensitive     CIPROFLOXACIN <=0.25 SENSITIVE Sensitive     GENTAMICIN <=1 SENSITIVE Sensitive     IMIPENEM <=0.25 SENSITIVE Sensitive     NITROFURANTOIN <=16 SENSITIVE Sensitive     TRIMETH/SULFA <=20 SENSITIVE Sensitive     AMPICILLIN/SULBACTAM <=2 SENSITIVE Sensitive     PIP/TAZO <=4 SENSITIVE Sensitive     Extended ESBL  NEGATIVE Sensitive     * >=100,000 COLONIES/mL ESCHERICHIA COLI  Urine culture     Status: None   Collection Time: 05/28/16  2:12 PM  Result Value Ref Range Status   Specimen Description URINE, RANDOM  Final   Special Requests NONE  Final   Culture NO GROWTH  Final   Report Status 05/29/2016 FINAL  Final  Urine culture     Status: None   Collection Time: 06/01/16  3:40 PM  Result Value Ref Range Status   Specimen Description URINE, CLEAN CATCH  Final   Special Requests NONE  Final   Culture NO GROWTH Performed at [facility]   Final   Report Status 06/03/2016 FINAL  Final  Culture, blood (routine x 2)     Status: None   Collection Time: 06/01/16  3:50 PM  Result Value Ref Range Status   Specimen Description BLOOD LEFT ANTECUBITAL  Final   Special Requests BOTTLES DRAWN AEROBIC AND ANAEROBIC 5CC  Final   Culture   Final    NO GROWTH 5 DAYS Performed at [facility]    Report Status 06/06/2016 FINAL  Final  Culture, blood (routine x 2)     Status: None   Collection Time: 06/01/16  4:12 PM  Result Value Ref Range Status   Specimen Description BLOOD RIGHT ANTECUBITAL  Final   Special Requests BOTTLES DRAWN AEROBIC AND ANAEROBIC 5CC  Final   Culture   Final    NO GROWTH 5 DAYS Performed at [facility]    Report Status 06/06/2016 FINAL  Final  MRSA PCR Screening     Status: None   Collection Time: 06/01/16  7:00 PM  Result Value Ref Range Status   MRSA by PCR NEGATIVE NEGATIVE Final    Comment:        The GeneXpert MRSA Assay (FDA approved for NASAL specimens only), is one component of a comprehensive MRSA colonization surveillance program. It is not intended to diagnose MRSA infection nor to guide or monitor treatment for MRSA infections.   Culture, blood (x 2)     Status: None (Preliminary result)   Collection Time: 06/04/16  8:20 AM  Result Value Ref Range Status   Specimen Description BLOOD LEFT ARM  Final   Special Requests IN PEDIATRIC  BOTTLE 2CC  Final   Culture   Final    NO GROWTH 2 DAYS Performed at [facility]    Report Status PENDING  Incomplete  Culture, blood (x 2)     Status: None (Preliminary result)   Collection Time: 06/04/16  8:20 AM  Result Value Ref Range Status   Specimen Description BLOOD LEFT HAND  Final   Special Requests IN PEDIATRIC BOTTLE 4CC  Final   Culture   Final    NO GROWTH 2 DAYS Performed at [facility]    Report Status PENDING  Incomplete  Respiratory Panel by PCR     Status: None   Collection Time: 06/04/16  9:22 AM  Result Value Ref Range Status   Adenovirus NOT DETECTED NOT DETECTED Final   Coronavirus 229E NOT DETECTED NOT DETECTED Final   Coronavirus HKU1 NOT DETECTED NOT DETECTED Final   Coronavirus NL63 NOT DETECTED NOT DETECTED Final   Coronavirus OC43 NOT DETECTED NOT DETECTED Final   Metapneumovirus NOT DETECTED NOT DETECTED Final   Rhinovirus / Enterovirus NOT DETECTED NOT DETECTED Final   Influenza A NOT DETECTED NOT DETECTED Final   Influenza B NOT DETECTED NOT DETECTED Final   Parainfluenza Virus 1 NOT DETECTED NOT DETECTED Final   Parainfluenza Virus 2 NOT DETECTED NOT DETECTED Final   Parainfluenza Virus 3 NOT DETECTED NOT DETECTED Final   Parainfluenza Virus 4 NOT DETECTED NOT DETECTED Final   Respiratory Syncytial Virus NOT DETECTED NOT DETECTED Final   Bordetella pertussis NOT DETECTED NOT DETECTED Final   Chlamydophila pneumoniae NOT DETECTED NOT DETECTED Final   Mycoplasma pneumoniae NOT DETECTED NOT DETECTED Final    Comment: Performed at [facility]  Urine culture     Status: None   Collection Time: 06/04/16  9:52 AM  Result Value Ref Range Status   Specimen Description URINE, CLEAN CATCH  Final   Special Requests Immunocompromised  Final   Culture NO GROWTH Performed at [facility]   Final   Report Status 06/05/2016 FINAL  Final    Radiology Reports Dg Chest 2 View  Result Date: 06/01/2016 CLINICAL DATA:   Confusion and weakness. History of stage IV right lower lobe lung adenocarcinoma. EXAM: CHEST  2 VIEW COMPARISON:  05/27/2016 FINDINGS: Chronic lung disease again noted. Right perihilar opacity partly corresponds to known pulmonary mass. There may be a component of adjacent atelectasis or pneumonia. No pulmonary edema or pleural fluid identified. No pneumothorax. The heart size is stable and normal. IMPRESSION: Right perihilar tumor with potential adjacent atelectasis or pneumonia. Electronically Signed   By: Aletta Edouard M.D.   On: 06/01/2016 16:09   Dg Chest 2 View  Result Date: 05/27/2016 CLINICAL DATA:  Fever with shortness of breath EXAM: CHEST  2 VIEW COMPARISON:  May 10, 2016 chest radiograph and PET-CT May 03, 2016 FINDINGS: Lungs are hyperexpanded. There is airspace consolidation in the right lower lobe in the area of known mass. The mass as a discrete structure known to be present in the right lower lobe is not evident by radiography. The lungs elsewhere are clear. No pneumothorax. Heart size is upper normal with pulmonary vascularity within normal limits. No adenopathy is appreciable by radiography. No bone lesions. IMPRESSION: Right lower lobe patchy infiltrate. Question element of hemorrhage from recent biopsy. There may also be superimposed pneumonia in the right base. The known mass in the right lower lobe region is not seen as a discrete structure by radiography. Lungs elsewhere clear.  Stable cardiac silhouette. Electronically Signed   By: Lowella Grip III M.D.   On: 05/27/2016 09:47   Mr Jeri Cos BM Contrast  Result Date: 05/27/2016 CLINICAL DATA:  82 y/o  M; metastatic lung cancer. EXAM: MRI HEAD WITHOUT AND WITH CONTRAST TECHNIQUE: Multiplanar, multiecho pulse sequences of the brain and surrounding structures were obtained without and with intravenous contrast. CONTRAST:  8m MULTIHANCE GADOBENATE DIMEGLUMINE 529 MG/ML IV SOLN COMPARISON:  None. FINDINGS: Brain: No  diffusion signal abnormality. Moderate brain parenchymal volume loss. Mild chronic microvascular ischemic changes in periventricular white matter and the pons. Left anterior corona radiata small no abnormal susceptibility hypointensity to indicate intracranial hemorrhage. Severe motion artifact of T1 postcontrast sequences. No gross evidence for abnormal enhancement. Chronic infarct. Vascular: Normal flow voids. Skull and upper cervical spine: Normal marrow signal. Left temporal scalp 10 mm cyst is probably sebaceous. Sinuses/Orbits: Negative.  Bilateral intra-ocular lens replacement. Other: None. IMPRESSION: 1. Extensive motion artifact of T1 postcontrast sequences. No gross evidence for abnormal enhancement. No signal abnormality on other sequences to suggest intracranial metastatic disease. 2. Moderate brain parenchymal volume loss, mild chronic microvascular ischemic changes, and chronic left anterior corona radiata small lacunar infarct. Electronically Signed   By: LKristine GarbeM.D.   On: 05/27/2016 20:49   Ct Abdomen Pelvis W Contrast  Result Date: 06/04/2016 CLINICAL DATA:  Sepsis. EXAM: CT ABDOMEN AND PELVIS WITH CONTRAST TECHNIQUE: Multidetector CT imaging of the abdomen and pelvis was performed using the standard protocol following bolus administration of intravenous contrast. CONTRAST:  109mISOVUE-300 IOPAMIDOL (ISOVUE-300) INJECTION 61% COMPARISON:  CT scan of June 28, 2005. FINDINGS: Lower chest: Mild left posterior basilar subsegmental atelectasis is noted. Mild right pleural effusion is noted with adjacent atelectasis or pneumonia. Hepatobiliary: Status post cholecystectomy.  Normal liver. Pancreas: 2.5 x 1.9 cm possible solid mass is noted inferiorly in pancreatic head. Spleen: Normal. Adrenals/Urinary Tract: Adrenal glands appear normal. Moderate bilateral hydroureteronephrosis is noted without definite evidence of obstructing calculus. Mild urinary bladder distention is  noted. Stomach/Bowel: There is no evidence of bowel obstruction. Vascular/Lymphatic: Atherosclerosis of abdominal aorta is noted without aneurysm formation. No significant adenopathy is noted. Reproductive: Normal prostate. Other: Mild anasarca is noted. Musculoskeletal: Moderate degenerative disc disease is noted at L5-S1. IMPRESSION: Aortic atherosclerosis. Right lower lobe pneumonia or atelectasis is noted with mild associated pleural effusion. Moderate bilateral hydroureteronephrosis is noted without definite obstructing calculus. Mild urinary bladder distention is noted and these findings are concerning for bladder outlet obstruction. Mild anasarca. 2.5 cm possible solid mass seen in pancreatic head. MRI is recommended for further evaluation on nonemergent basis. Electronically Signed   By: JaMarijo ConceptionM.D.   On: 06/04/2016 15:42   Us[place]al  Result Date: 05/24/2016 CLINICAL DATA:  Acute kidney injury.  Abnormal labs today. EXAM: RENAL / URINARY TRACT ULTRASOUND COMPLETE COMPARISON:  PET-CT 05/03/2016 FINDINGS: Right Kidney: Length: 11.8 cm. Echogenicity within normal limits. No mass or hydronephrosis visualized. Left Kidney: Length: 12.2 cm. Echogenicity is normal. No hydronephrosis. A simple cyst is identified in the lower pole region measuring 2.9 x 2.6 x 2.3 cm. Bladder: Appears normal for degree of bladder distention. Bilateral ureteral jets are visualized. IMPRESSION: 1. No hydronephrosis. 2. Simple cyst in the lower pole the left kidney. Electronically Signed   By: ElNolon Nations.D.   On: 05/24/2016 18:42   Ct Biopsy  Result Date: 05/10/2016 INDICATION: [age]ear old with a suspicious nodule in the right lower lobe and multiple bone lesions. Findings are concerning  for lung cancer. Tissue diagnosis is needed. EXAM: CT-GUIDED CORE BIOPSY OF RIGHT LOWER LOBE LUNG NODULE MEDICATIONS: None. ANESTHESIA/SEDATION: Moderate (conscious) sedation was employed during this procedure. A total of  Versed 1.0 mg and Fentanyl 25 mcg was administered intravenously. Moderate Sedation Time: 15 minutes. The patient's level of consciousness and vital signs were monitored continuously by radiology nursing throughout the procedure under my direct supervision. FLUOROSCOPY TIME:  None COMPLICATIONS: None immediate. PROCEDURE: Informed written consent was obtained from the patient after a thorough discussion of the procedural risks, benefits and alternatives. All questions were addressed. A timeout was performed prior to the initiation of the procedure. Patient was placed on his right side. CT images through the chest were obtained. The nodule along the posterior right lower lobe was identified. Overlying skin was cleansed with chlorhexidine and sterile field was created. Skin was anesthetized with 1% lidocaine. A 17 gauge coaxial needle was directed into the pulmonary nodule with CT guidance. Needle position confirmed within the lesion. Two core biopsies were obtained with an 18 gauge core device. Specimens placed in formalin. 74 gauge needle was removed with the BiosSentry tract sealant. 17 gauge needle removed without complication. Bandage placed over the puncture site. FINDINGS: 2.4 cm pleural-based nodule in the right lower lobe. Needle position confirmed within this lesion. No significant pneumothorax or hemorrhage following the core biopsies. IMPRESSION: Successful CT-guided core biopsies of the right lower lobe nodule. Electronically Signed   By: Markus Daft M.D.   On: 05/10/2016 16:23   Dg Chest Port 1 View  Result Date: 06/04/2016 CLINICAL DATA:  Fever and sepsis.  Right-sided lung carcinoma EXAM: PORTABLE CHEST 1 VIEW COMPARISON:  June 01, 2016 FINDINGS: Persistent right perihilar prominence is felt to be consistent with previously documented lung mass. There is new interstitial pulmonary edema. There is a new right pleural effusion. There is cardiomegaly with pulmonary venous hypertension. There is  atherosclerotic calcification aorta. No adenopathy is seen radiographically. IMPRESSION: Findings indicative of congestive heart failure superimposed on chronic fibrotic type change. Soft tissue fullness in the right perihilar region is stable without progression. There is aortic atherosclerosis. Electronically Signed   By: Lowella Grip III M.D.   On: 06/04/2016 08:24   Dg Chest Port 1 View  Result Date: 05/10/2016 CLINICAL DATA:  Status post RIGHT lung biopsy. EXAM: PORTABLE CHEST 1 VIEW COMPARISON:  Chest radiograph March 25, 2016 FINDINGS: Mildly tortuous aorta associated with hypertension. Cardiac silhouette is normal in size. Similar prominent interstitial markings and increased lung volumes most compatible with COPD. LEFT lung base atelectasis/ scarring. No pleural effusion. No pneumothorax. Soft tissue planes and included osseous structures are nonsuspicious. IMPRESSION: No pneumothorax. COPD.  LEFT lung base atelectasis/scarring. Electronically Signed   By: Elon Alas M.D.   On: 05/10/2016 14:33    Time Spent in Darliss Ridgel M.D on 06/06/2016 at 12:52 PM  Between 7am to 7pm - Pager - [phone]  After 7pm go to [email or web address] - password [facility]  [facility] -  Office  [phone]

## 2016-06-06 NOTE — Progress Notes (Signed)
Palliative medicine consult received.  I stopped by to meet with Alexander Landry and his wife.  His wife reports that they are waiting to speak with Dr. Julien Nordmann tomorrow and would like to wait until they have a chance to speak with him prior to my meeting with them.  We'll plan to follow-up tomorrow for per family request.  Please let me know if there immediately needs or other concerns with which I may be of assistance prior to that time.  Micheline Rough, MD [place] [facility] Team [phone]  NO CHARGE NOTE.

## 2016-06-07 ENCOUNTER — Ambulatory Visit
Admission: RE | Admit: 2016-06-07 | Discharge: 2016-06-07 | Disposition: A | Discharge status: Home / Self Care | Payer: Commercial Managed Care - HMO | Source: Ambulatory Visit | Attending: Radiation Oncology | Admitting: Radiation Oncology

## 2016-06-07 ENCOUNTER — Ambulatory Visit: Payer: Commercial Managed Care - HMO | Attending: Radiation Oncology | Admitting: Radiation Oncology

## 2016-06-07 DIAGNOSIS — R5383 Other fatigue: Secondary | ICD-10-CM

## 2016-06-07 DIAGNOSIS — Z515 Encounter for palliative care: Secondary | ICD-10-CM

## 2016-06-07 DIAGNOSIS — C7951 Secondary malignant neoplasm of bone: Secondary | ICD-10-CM

## 2016-06-07 DIAGNOSIS — R197 Diarrhea, unspecified: Secondary | ICD-10-CM

## 2016-06-07 DIAGNOSIS — Z7189 Other specified counseling: Secondary | ICD-10-CM

## 2016-06-07 DIAGNOSIS — E118 Type 2 diabetes mellitus with unspecified complications: Secondary | ICD-10-CM

## 2016-06-07 DIAGNOSIS — C7931 Secondary malignant neoplasm of brain: Secondary | ICD-10-CM

## 2016-06-07 DIAGNOSIS — C3431 Malignant neoplasm of lower lobe, right bronchus or lung: Secondary | ICD-10-CM

## 2016-06-07 LAB — GLUCOSE, CAPILLARY
GLUCOSE-CAPILLARY: 132 mg/dL — AB (ref 65–99)
GLUCOSE-CAPILLARY: 150 mg/dL — AB (ref 65–99)
GLUCOSE-CAPILLARY: 112 mg/dL — AB (ref 65–99)
GLUCOSE-CAPILLARY: 118 mg/dL — AB (ref 65–99)
GLUCOSE-CAPILLARY: 95 mg/dL (ref 65–99)
GLUCOSE-CAPILLARY: 134 mg/dL — AB (ref 65–99)
GLUCOSE-CAPILLARY: 135 mg/dL — AB (ref 65–99)
GLUCOSE-CAPILLARY: 159 mg/dL — AB (ref 65–99)
GLUCOSE-CAPILLARY: 117 mg/dL — AB (ref 65–99)
GLUCOSE-CAPILLARY: 124 mg/dL — AB (ref 65–99)
GLUCOSE-CAPILLARY: 127 mg/dL — AB (ref 65–99)
Glucose-Capillary: 157 mg/dL — ABNORMAL HIGH (ref 65–99)
Glucose-Capillary: 177 mg/dL — ABNORMAL HIGH (ref 65–99)
Glucose-Capillary: 125 mg/dL — ABNORMAL HIGH (ref 65–99)
Glucose-Capillary: 143 mg/dL — ABNORMAL HIGH (ref 65–99)
Glucose-Capillary: 89 mg/dL (ref 65–99)
Glucose-Capillary: 101 mg/dL — ABNORMAL HIGH (ref 65–99)
Glucose-Capillary: 138 mg/dL — ABNORMAL HIGH (ref 65–99)
Glucose-Capillary: 130 mg/dL — ABNORMAL HIGH (ref 65–99)
Glucose-Capillary: 147 mg/dL — ABNORMAL HIGH (ref 65–99)
Glucose-Capillary: 157 mg/dL — ABNORMAL HIGH (ref 65–99)

## 2016-06-07 NOTE — Progress Notes (Signed)
PROGRESS NOTE    Alexander Landry  VPX:[id number] DOB: Nov 15, 1932 DOA: 06/01/2016 PCP: [name], MD   Brief Narrative: 82 year old male with hypertension, diabetes mellitus, Hodgkin's lymphoma status post systemic chemotherapy about 20 years back, recently diagnosed metastatic bronchogenic adenocarcinoma, hospitalized at [facility] with acute renal failure and pneumonia and ongoing fever (felt secondary to cancer) and was discharged home on 11/20. He was started on palliative radiation during the hospitalization. He was sent to the ED after completion of radiation with generalized weakness and 2 episodes of watery diarrhea.  Patient was septic with fever of 100.89F, hypotensive with blood pressure of 87/65 mmHg, hypoxic and tachypnea. Blood work showed leukocytosis with WBC of 16.2, potassium of 3.4, AKi with creatinine 1.37 and elevated lactic acid of 2.89.  Assessment & Plan:  # Sepsis, suspected aspiration pneumonia: Currently on IV Zosyn. Off vancomycin. CT scan of abdomen and pelvis showing right lower lobe pneumonia.   #Adenocarcinoma of right lung, stage 4 ([place]), with bony metastases: Currently receiving palliative radiation treatment. As per prior medical records and patient's family member they are waiting to discuss with Dr. Julien Nordmann about pending results of molecular study from the biopsy and any treatment options. Today, I called Dr. Julien Nordmann and discussed with him regarding family's concern. He will either see the patient or contact them to discuss. -Palliative care evaluation ongoing, consult appreciated. Patient may benefit from hospice.  #Diarrhea resolved   # Diabetes mellitus type 2 with complications [facility]): Monitor blood sugar level. On sliding scale.   # Protein-calorie malnutrition, severe: Dietary referral and supplement.  #Acute encephalopathy: MRI  unremarkable. As per family he has baseline dementia. Mental status improving.    #  AKI (acute kidney  injury) [facility]): Serum creatinine level improved.  DVT prophylaxis: Heparin subcutaneous Code Status: DO NOT RESUSCITATE Family Communication: Discussed with the patient's wife at bedside Disposition Plan: Likely discharge to SNF in 1-2 days.    Consultants:   Palliative care, oncology  Procedures: None Antimicrobials: IV Zosyn  Subjective: Patient was seen and examined at bedside. Patient was alert awake and following simple commands. Denied headache, chest pain, shortness of breath, nausea or vomiting. Patient's wife at bedside.   Objective: Vitals:   06/06/16 0501 06/06/16 0536 06/06/16 1440 06/06/16 2137  BP: 140/81  (!) 119/56 (!) 150/80  Pulse: (!) 104  (!) 102 (!) 104  Resp: (!) '28 20 18 20  '$ Temp: 98.2 F (36.8 C)  98.3 F (36.8 C) 99.1 F (37.3 C)  TempSrc: Oral  Oral Oral  SpO2: 92%  93% 91%  Weight:      Height:        Intake/Output Summary (Last 24 hours) at 06/07/16 1313 Last data filed at 06/07/16 4627  Gross per 24 hour  Intake              100 ml  Output              600 ml  Net             -500 ml   Filed Weights   06/02/16 1334  Weight: 96.7 kg (213 lb 3 oz)    Examination:  General exam: Elderly male appears calm and comfortable  Respiratory system: Clear to auscultation. Respiratory effort normal.  Cardiovascular system: S1 & S2 heard, RRR.  No pedal edema. Gastrointestinal system: Abdomen is nondistended, soft and nontender. Normal bowel sounds heard. Central nervous system: Alert, awake and following simple commands. Extremities: Symmetric 5 x  5 power. Skin: No rashes, lesions or ulcers Psychiatry: Judgement and insight appear impaired.      Data Reviewed: I have personally reviewed following labs and imaging studies  CBC:  Recent Labs Lab 06/01/16 1548 06/02/16 0314 06/03/16 0404 06/04/16 0339 06/06/16 0540  WBC 22.8* 16.2* 15.2* 16.0* 11.8*  NEUTROABS 16.1*  --   --   --   --   HGB 12.1* 10.4* 9.8* 10.3* 10.0*  HCT 37.1*  31.6* 29.6* 31.6* 30.5*  MCV 96.9 98.1 95.8 96.0 97.8  PLT 318 289 295 349 191*   Basic Metabolic Panel:  Recent Labs Lab 06/02/16 0314 06/03/16 0404 06/04/16 0339 06/05/16 0616 06/06/16 0540  NA 139 139 138 145 145  K 3.4* 4.0 4.1 4.1 3.3*  CL 103 104 106 112* 109  CO2 '27 26 24 25 27  '$ GLUCOSE 168* 162* 146* 106* 146*  BUN 37* 40* 39* 30* 24*  CREATININE 1.37* 1.48* 1.52* 1.36* 1.04  CALCIUM 7.8* 8.0* 8.2* 7.8* 7.7*  MG 1.3* 2.3  --   --   --    GFR: Estimated Creatinine Clearance: 66.6 mL/min (by C-G formula based on SCr of 1.04 mg/dL). Liver Function Tests:  Recent Labs Lab 06/01/16 1548  AST 27  ALT 39  ALKPHOS 123  BILITOT 0.9  PROT 6.8  ALBUMIN 2.6*   No results for input(s): LIPASE, AMYLASE in the last 168 hours.  Recent Labs Lab 06/03/16 1359  AMMONIA 21   Coagulation Profile:  Recent Labs Lab 06/04/16 0820  INR 1.07   Cardiac Enzymes: No results for input(s): CKTOTAL, CKMB, CKMBINDEX, TROPONINI in the last 168 hours. BNP (last 3 results) No results for input(s): PROBNP in the last 8760 hours. HbA1C: No results for input(s): HGBA1C in the last 72 hours. CBG:  Recent Labs Lab 06/06/16 1225 06/06/16 1806 06/06/16 2134 06/07/16 0808 06/07/16 1202  GLUCAP 159* 117* 130* 147* 124*   Lipid Profile: No results for input(s): CHOL, HDL, LDLCALC, TRIG, CHOLHDL, LDLDIRECT in the last 72 hours. Thyroid Function Tests: No results for input(s): TSH, T4TOTAL, FREET4, T3FREE, THYROIDAB in the last 72 hours. Anemia Panel: No results for input(s): VITAMINB12, FOLATE, FERRITIN, TIBC, IRON, RETICCTPCT in the last 72 hours. Sepsis Labs:  Recent Labs Lab 06/01/16 1816 06/01/16 2112 06/04/16 0820 06/04/16 1126  PROCALCITON 0.43  --  0.44  --   LATICACIDVEN 2.1* 1.6 1.7 0.9    Recent Results (from the past 240 hour(s))  Urine culture     Status: None   Collection Time: 05/28/16  2:12 PM  Result Value Ref Range Status   Specimen Description  URINE, RANDOM  Final   Special Requests NONE  Final   Culture NO GROWTH  Final   Report Status 05/29/2016 FINAL  Final  Urine culture     Status: None   Collection Time: 06/01/16  3:40 PM  Result Value Ref Range Status   Specimen Description URINE, CLEAN CATCH  Final   Special Requests NONE  Final   Culture NO GROWTH Performed at [facility]   Final   Report Status 06/03/2016 FINAL  Final  Culture, blood (routine x 2)     Status: None   Collection Time: 06/01/16  3:50 PM  Result Value Ref Range Status   Specimen Description BLOOD LEFT ANTECUBITAL  Final   Special Requests BOTTLES DRAWN AEROBIC AND ANAEROBIC 5CC  Final   Culture   Final    NO GROWTH 5 DAYS Performed at [facility]  Report Status 06/06/2016 FINAL  Final  Culture, blood (routine x 2)     Status: None   Collection Time: 06/01/16  4:12 PM  Result Value Ref Range Status   Specimen Description BLOOD RIGHT ANTECUBITAL  Final   Special Requests BOTTLES DRAWN AEROBIC AND ANAEROBIC 5CC  Final   Culture   Final    NO GROWTH 5 DAYS Performed at [facility]    Report Status 06/06/2016 FINAL  Final  MRSA PCR Screening     Status: None   Collection Time: 06/01/16  7:00 PM  Result Value Ref Range Status   MRSA by PCR NEGATIVE NEGATIVE Final    Comment:        The GeneXpert MRSA Assay (FDA approved for NASAL specimens only), is one component of a comprehensive MRSA colonization surveillance program. It is not intended to diagnose MRSA infection nor to guide or monitor treatment for MRSA infections.   Culture, blood (x 2)     Status: None (Preliminary result)   Collection Time: 06/04/16  8:20 AM  Result Value Ref Range Status   Specimen Description BLOOD LEFT ARM  Final   Special Requests IN PEDIATRIC BOTTLE [place]  Final   Culture   Final    NO GROWTH 3 DAYS Performed at [facility]    Report Status PENDING  Incomplete  Culture, blood (x 2)     Status: None (Preliminary result)     Collection Time: 06/04/16  8:20 AM  Result Value Ref Range Status   Specimen Description BLOOD LEFT HAND  Final   Special Requests IN PEDIATRIC BOTTLE 4CC  Final   Culture   Final    NO GROWTH 3 DAYS Performed at [facility]    Report Status PENDING  Incomplete  Respiratory Panel by PCR     Status: None   Collection Time: 06/04/16  9:22 AM  Result Value Ref Range Status   Adenovirus NOT DETECTED NOT DETECTED Final   Coronavirus 229E NOT DETECTED NOT DETECTED Final   Coronavirus HKU1 NOT DETECTED NOT DETECTED Final   Coronavirus NL63 NOT DETECTED NOT DETECTED Final   Coronavirus OC43 NOT DETECTED NOT DETECTED Final   Metapneumovirus NOT DETECTED NOT DETECTED Final   Rhinovirus / Enterovirus NOT DETECTED NOT DETECTED Final   Influenza A NOT DETECTED NOT DETECTED Final   Influenza B NOT DETECTED NOT DETECTED Final   Parainfluenza Virus 1 NOT DETECTED NOT DETECTED Final   Parainfluenza Virus 2 NOT DETECTED NOT DETECTED Final   Parainfluenza Virus 3 NOT DETECTED NOT DETECTED Final   Parainfluenza Virus 4 NOT DETECTED NOT DETECTED Final   Respiratory Syncytial Virus NOT DETECTED NOT DETECTED Final   Bordetella pertussis NOT DETECTED NOT DETECTED Final   Chlamydophila pneumoniae NOT DETECTED NOT DETECTED Final   Mycoplasma pneumoniae NOT DETECTED NOT DETECTED Final    Comment: Performed at [facility]  Urine culture     Status: None   Collection Time: 06/04/16  9:52 AM  Result Value Ref Range Status   Specimen Description URINE, CLEAN CATCH  Final   Special Requests Immunocompromised  Final   Culture NO GROWTH Performed at [facility]   Final   Report Status 06/05/2016 FINAL  Final         Radiology Studies: No results found.      Scheduled Meds: . dronabinol  2.5 mg Oral BID  . feeding supplement (ENSURE ENLIVE)  237 mL Oral TID BM  . heparin  5,000 Units Subcutaneous Q8H  . insulin aspart  0-9 Units Subcutaneous TID WC  . ketorolac  15 mg  Intravenous Q8H  . mirtazapine  15 mg Oral QHS  . multivitamin with minerals  1 tablet Oral Daily  . piperacillin-tazobactam (ZOSYN)  IV  3.375 g Intravenous Q8H  . potassium chloride  20 mEq Oral Daily  . simvastatin  20 mg Oral q1800   Continuous Infusions:   LOS: 6 days   Puja Caffey Tanna Furry, MD [facility] Pager [phone]  If 7PM-7AM, please contact night-coverage [email or web address] Password [email or web address] 06/07/2016, 1:13 PM

## 2016-06-07 NOTE — Progress Notes (Addendum)
Department of Radiation Oncology  Phone:  [phone] Fax:        [phone]  Inpatient   Weekly Treatment Note    Name: Alexander Landry Date: 06/07/2016 MRN: [id number] DOB: Jul 30, 1932   Diagnosis:     ICD-9-CM ICD-10-CM   1. Bone metastases (HCC) 198.5 C79.51      Current dose: 14 Gy  Current fraction: 4   MEDICATIONS: No current facility-administered medications for this encounter.    No current outpatient prescriptions on file.   Facility-Administered Medications Ordered in Other Encounters  Medication Dose Route Frequency Provider Last Rate Last Dose  . acetaminophen (TYLENOL) suppository 650 mg  650 mg Rectal Q4H PRN Nishant Dhungel, MD   650 mg at 06/04/16 0830  . acetaminophen (TYLENOL) tablet 500 mg  500 mg Oral Q4H PRN Silver Huguenin Elgergawy, MD      . albuterol (PROVENTIL) (2.5 MG/3ML) 0.083% nebulizer solution 2.5 mg  2.5 mg Nebulization Q2H PRN Albertine Patricia, MD      . dronabinol (MARINOL) capsule 2.5 mg  2.5 mg Oral BID Albertine Patricia, MD   2.5 mg at 06/07/16 0838  . feeding supplement (ENSURE ENLIVE) (ENSURE ENLIVE) liquid 237 mL  237 mL Oral TID BM Nishant Dhungel, MD   237 mL at 06/07/16 1000  . heparin injection 5,000 Units  5,000 Units Subcutaneous Q8H Albertine Patricia, MD   5,000 Units at 06/07/16 1328  . HYDROcodone-acetaminophen (NORCO) 7.5-325 MG per tablet 1 tablet  1 tablet Oral Q4H PRN Lorayne Bender, PA-C   1 tablet at 06/07/16 0018  . insulin aspart (novoLOG) injection 0-9 Units  0-9 Units Subcutaneous TID WC Albertine Patricia, MD   1 Units at 06/07/16 0839  . iopamidol (ISOVUE-300) 61 % injection 15 mL  15 mL Oral Once PRN Nishant Dhungel, MD      . ketorolac (TORADOL) 15 MG/ML injection 15 mg  15 mg Intravenous Q8H Nishant Dhungel, MD   15 mg at 06/07/16 1328  . mirtazapine (REMERON) tablet 15 mg  15 mg Oral QHS Albertine Patricia, MD   15 mg at 06/06/16 2133  . multivitamin with minerals tablet 1 tablet  1 tablet Oral Daily  Albertine Patricia, MD   1 tablet at 06/07/16 [phone]  . ondansetron (ZOFRAN) tablet 4 mg  4 mg Oral Q6H PRN Albertine Patricia, MD       Or  . ondansetron (ZOFRAN) injection 4 mg  4 mg Intravenous Q6H PRN Silver Huguenin Elgergawy, MD      . piperacillin-tazobactam (ZOSYN) IVPB 3.375 g  3.375 g Intravenous Q8H Nishant Dhungel, MD   3.375 g at 06/07/16 1328  . potassium chloride (K-DUR) CR tablet 20 mEq  20 mEq Oral Daily Albertine Patricia, MD   20 mEq at 06/07/16 0838  . simvastatin (ZOCOR) tablet 20 mg  20 mg Oral q1800 Albertine Patricia, MD   20 mg at 06/07/16 1700     ALLERGIES: Patient has no known allergies.   LABORATORY DATA:  Lab Results  Component Value Date   WBC 11.8 (H) 06/06/2016   HGB 10.0 (L) 06/06/2016   HCT 30.5 (L) 06/06/2016   MCV 97.8 06/06/2016   PLT 429 (H) 06/06/2016   Lab Results  Component Value Date   NA 145 06/06/2016   K 3.3 (L) 06/06/2016   CL 109 06/06/2016   CO2 27 06/06/2016   Lab Results  Component Value Date   ALT 39 06/01/2016  AST 27 06/01/2016   ALKPHOS 123 06/01/2016   BILITOT 0.9 06/01/2016     NARRATIVE: Alexander Landry was seen today for weekly treatment management. The chart was checked and the patient's films were reviewed.  The patient is doing well with his treatment area dye spoke to the patient and his wife today. No difficulties related to radiation treatment. The patient remains an inpatient.  PHYSICAL EXAMINATION: vitals were not taken for this visit.     Alert, responsive to comments and discussion  ASSESSMENT: The patient is doing satisfactorily with treatment.  PLAN: We will continue with the patient's radiation treatment as planned.

## 2016-06-07 NOTE — Progress Notes (Signed)
Physical Therapy Treatment Patient Details Name: Alexander Landry MRN: [id number] DOB: 1932-09-13 Today's Date: 06/07/2016    History of Present Illness 82 y.o. Male with recent DC from [facility] 05/31/16, admission was for PNA. 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     PT Comments    Pt was able to stand and pivot to recliner on today. He is very unsteady and at risk for falls. He fatigues easily with activity. He follows 1 step commands however his cognition is impaired.   Follow Up Recommendations  SNF     Equipment Recommendations  None recommended by PT    Recommendations for Other Services       Precautions / Restrictions Precautions Precautions: Fall Precaution Comments: watch HR Restrictions Weight Bearing Restrictions: No    Mobility  Bed Mobility Overal bed mobility: Needs Assistance Bed Mobility: Supine to Sit     Supine to sit: Min assist;+2 for physical assistance;+2 for safety/equipment;HOB elevated     General bed mobility comments: Assist for trunk and bil LEs. Pt fatigued once at EOB.   Transfers Overall transfer level: Needs assistance Equipment used: Rolling walker (2 wheeled) Transfers: Sit to/from [facility] Sit to Stand: Min assist;+2 physical assistance;+2 safety/equipment Stand pivot transfers: Min assist;+2 physical assistance;+2 safety/equipment       General transfer comment: Assist to rise, steady, control descent. Pt is unsteady.   Ambulation/Gait                 Stairs            Wheelchair Mobility    Modified Rankin (Stroke Patients Only)       Balance     Sitting balance-Leahy Scale: Fair       Standing balance-Leahy Scale: Poor                      Cognition Arousal/Alertness: Awake/alert Behavior During Therapy: WFL for tasks assessed/performed Overall Cognitive Status: Impaired/Different from baseline Area of Impairment: Attention;Safety/judgement;Problem solving;Orientation Orientation Level: Disoriented to;Situation;Time;Place   Memory: Decreased short-term memory;Decreased recall of precautions   Safety/Judgement: Decreased awareness of safety;Decreased awareness of deficits   Problem Solving: Requires tactile cues;Requires verbal cues      Exercises General Exercises - Lower Extremity Long Arc Quad: AROM;Both;10 reps;Seated Hip Flexion/Marching: AROM;Both;Seated;10 reps    General Comments        Pertinent Vitals/Pain Pain Assessment: No/denies pain    Home Living                      Prior Function            PT Goals (current goals can now be found in the care plan section) Progress towards PT goals: Progressing toward goals    Frequency    Min 3X/week      PT Plan Current plan remains appropriate    Co-evaluation             End of Session Equipment Utilized During Treatment: Gait belt;Oxygen Activity Tolerance: Patient limited by fatigue Patient left: in chair;with call bell/phone within reach;with chair alarm set;with family/visitor present     Time: 0017-4944 PT Time Calculation (min) (ACUTE ONLY): 22 min  Charges:  $Therapeutic Activity: 8-22 mins  G Codes:      Weston Anna, MPT Pager: [phone]

## 2016-06-07 NOTE — Progress Notes (Signed)
Pharmacy Antibiotic Note  Alexander Landry is a 82 y.o. male with PMH of HTN, DM, Hodgkin's lymphoma, recent diagnosis of metastatic bronchogenic adenocarcinoma admitted on 06/01/2016 with weakness and diarrhea, started empirically on PO Vancomycin per MD for possible C.diff. No further diarrhea since admission and PO Vancomycin subsequently discontinued. Patient febrile 11/24 and with worsening leukocytosis, Pharmacy consulted to start IV Vancomycin and Zosyn for sepsis - narrowed to Zosyn alone for suspected aspiration pneumonia.  Plan:  Zosyn 3.375g IV Q8H infused over 4hrs.   Monitor renal function, cultures, clinical course.  Height: '6\' 3"'$  (190.5 cm) Weight: 213 lb 3 oz (96.7 kg) IBW/kg (Calculated) : 84.5  Temp (24hrs), Avg:98.7 F (37.1 C), Min:98.3 F (36.8 C), Max:99.1 F (37.3 C)   Recent Labs Lab 06/01/16 1548 06/01/16 1604 06/01/16 1816 06/01/16 2112 06/02/16 0314 06/03/16 0404 06/04/16 0339 06/04/16 0820 06/04/16 1126 06/05/16 0616 06/06/16 0540  WBC 22.8*  --   --   --  16.2* 15.2* 16.0*  --   --   --  11.8*  CREATININE 1.59*  --   --   --  1.37* 1.48* 1.52*  --   --  1.36* 1.04  LATICACIDVEN  --  2.89* 2.1* 1.6  --   --   --  1.7 0.9  --   --     Estimated Creatinine Clearance: 66.6 mL/min (by C-G formula based on SCr of 1.04 mg/dL).    No Known Allergies  Antimicrobials this admission: 11/21 >> Vancomycin (PO) >> 11/24 11/24 >> Vancomycin IV >> 11/26 11/24 >> Zosyn >>  Dose adjustments this admission:  Microbiology results: 11/21 BCx: NGF 11/21 UCx: NGF   11/21 MRSA PCR: negative 11/24 BCx: ngtd 11/24 UCx: NGF 11/24 respiratory panel: negative  Thank you for allowing pharmacy to be a part of this patient's care.  Gretta Arab PharmD, BCPS Pager [phone] 06/07/2016 2:12 PM

## 2016-06-07 NOTE — Consult Note (Signed)
Consultation Note Date: 06/07/2016   Patient Name: Alexander Landry  DOB: [birth year]  MRN: [id number]  Age / Sex: 82 y.o., male  PCP: Denita Lung, MD Referring Physician: Rosita Fire, MD  Reason for Consultation: Establishing goals of care  HPI/Patient Profile: 82 year old male with hypertension, diabetes mellitus, Hodgkin's lymphoma status post systemic chemotherapy (20 years ago), recently diagnosed metastatic bronchogenic adenocarcinoma, hospitalized at [facility] with acute renal failure and pneumonia and ongoing fever (felt secondary to cancer) and was discharged home on 11/20. He was started on palliative radiation during the hospitalization. He was sent to the ED after completion of radiation with generalized weakness and 2 episodes of watery diarrhea.  Currently being treated for sepsis.  Palliative consulted for goals of care.  Clinical Assessment and Goals of Care: Met today with patient and his wife.  Discussed that the most important thing to him is his family and being at home.  He has 2 sons.  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  His wife reports that his doctors have been doing a good job explaining things to them.  They understand that he has non-curable disease.  We discussed his clinical course and pathways forward.    In light of this, we discussed that the goal of medical therapies is to add time and quality to his life.  We discussed hospice as a tool that may be beneficial in his goal when he reaches a point where he is not likely to benefit from further disease modifying therapy.  SUMMARY OF RECOMMENDATIONS   - DNR - Patient and his wife understand that this is not curable - Patient is invested in pursuing therapies that may add time or quality to his life.  Currently undergoing palliative XRT and plan to complete course as planned.   - Family is waiting to discuss  further with Dr. Julien Nordmann.  At this time molecular studies are pending.  Once these return, he would like to pursue immuno/chemo/targeted therapy if it will add time and/or quality and his functional status is good enough to do so.  Code Status/Advance Care Planning:  DNR  Palliative Prophylaxis:   Bowel Regimen, Delirium Protocol and Frequent Pain Assessment  Psycho-social/Spiritual:   Desire for further Chaplaincy support:no  Additional Recommendations: Caregiving  Support/Resources  Prognosis:   Unable to determine  Discharge Planning: [place] for rehab with Palliative care service follow-up      Primary Diagnoses: Present on Admission: . AKI (acute kidney injury) ([place]) . Adenocarcinoma of right lung, stage 4 ([place]) . Bone metastases ([place]) . Diabetes mellitus type 2 with complications ([place]) . Hypotension . Protein-calorie malnutrition, severe   I have reviewed the medical record, interviewed the patient and family, and examined the patient. The following aspects are pertinent.  Past Medical History:  Diagnosis Date  . Adenocarcinoma of right lung, stage 4 ([place]) 05/24/2016  . Diabetes mellitus without complication ([place])   . GERD (gastroesophageal reflux disease)   . Hypertension   . Kyphosis (  acquired) ([place])   . Lung cancer ([place]) 05/10/2016   right lower lobe lung=adenocarcinoma  . Medical history non-contributory    Social History   Social History  . Marital status: Married    Spouse name: N/A  . Number of children: N/A  . Years of education: N/A   Social History Main Topics  . Smoking status: Former Smoker    Packs/day: 1.00    Years: 40.00    Types: Cigarettes    Quit date: 08/24/1985  . Smokeless tobacco: Never Used  . Alcohol use No  . Drug use: No  . Sexual activity: Not Currently   Other Topics Concern  . None   Social History Narrative  . None   Family History  Problem Relation Age of Onset  . Kidney cancer Mother     Scheduled Meds: . dronabinol  2.5 mg Oral BID  . feeding supplement (ENSURE ENLIVE)  237 mL Oral TID BM  . heparin  5,000 Units Subcutaneous Q8H  . insulin aspart  0-9 Units Subcutaneous TID WC  . ketorolac  15 mg Intravenous Q8H  . mirtazapine  15 mg Oral QHS  . multivitamin with minerals  1 tablet Oral Daily  . piperacillin-tazobactam (ZOSYN)  IV  3.375 g Intravenous Q8H  . potassium chloride  20 mEq Oral Daily  . simvastatin  20 mg Oral q1800   Continuous Infusions: PRN Meds:.acetaminophen, acetaminophen, albuterol, HYDROcodone-acetaminophen, iopamidol, ondansetron **OR** ondansetron (ZOFRAN) IV Medications Prior to Admission:  Prior to Admission medications   Medication Sig Start Date End Date Taking? Authorizing Provider  acetaminophen (TYLENOL) 500 MG tablet Take 1 tablet (500 mg total) by mouth every 4 (four) hours as needed for fever, headache or mild pain. 05/31/16  Yes Eugenie Filler, MD  atenolol (TENORMIN) 50 MG tablet Take 1 tablet (50 mg total) by mouth 2 (two) times daily. 04/12/16  Yes Denita Lung, MD  dronabinol (MARINOL) 2.5 MG capsule Take 2.5 mg by mouth 2 (two) times daily. 05/26/16  Yes Historical Provider, MD  feeding supplement, ENSURE ENLIVE, (ENSURE ENLIVE) LIQD Take 237 mLs by mouth 2 (two) times daily between meals. 06/01/16  Yes Eugenie Filler, MD  HYDROcodone-acetaminophen (NORCO) 7.5-325 MG tablet Take 1 tablet by mouth every 4 (four) hours as needed for moderate pain. 05/13/16  Yes Denita Lung, MD  metFORMIN (GLUCOPHAGE) 500 MG tablet Take 1 tablet (500 mg total) by mouth 2 (two) times daily. 04/05/16  Yes Denita Lung, MD  omeprazole (PRILOSEC) 20 MG capsule TAKE 1 CAPSULE EVERY DAY 09/16/15  Yes Denita Lung, MD  pioglitazone (ACTOS) 30 MG tablet 1 tablet daily 09/16/15  Yes Denita Lung, MD  simvastatin (ZOCOR) 20 MG tablet Take 1 tablet (20 mg total) by mouth daily at 6 PM. 08/25/15  Yes Denita Lung, MD  amLODipine (NORVASC) 5 MG tablet  Take 1 tablet (5 mg total) by mouth daily. 06/01/16   Eugenie Filler, MD  hyaluronate sodium (RADIAPLEXRX) GEL Apply 1 application topically 2 (two) times daily. Apply to b/l shoulders and sternum daily after radiation and bedtime 06/01/16   Hayden Pedro, PA-C  mirtazapine (REMERON) 15 MG tablet Take 1 tablet (15 mg total) by mouth at bedtime. 05/31/16   Eugenie Filler, MD  Multiple Vitamins-Minerals (MULTIVITAMIN WITH MINERALS) tablet Take 1 tablet by mouth daily.    Historical Provider, MD  potassium chloride (K-DUR) 10 MEQ tablet Take 2 tablets (20 mEq total) by mouth daily. 05/31/16 06/02/16  Eugenie Filler, MD   No Known Allergies Review of Systems  Constitutional: Positive for activity change, appetite change, fatigue and fever.  Musculoskeletal: Positive for back pain.  Neurological: Positive for weakness.  Psychiatric/Behavioral: Positive for sleep disturbance.     Physical Exam General: Alert, awake, in no acute distress.  HEENT: No bruits, no goiter, no JVD Heart: Regular rate and rhythm. No murmur appreciated. Lungs: Good air movement, clear Abdomen: Soft, nontender, nondistended, positive bowel sounds.  Ext: No significant edema Skin: Warm and dry Neuro: Grossly intact, nonfocal.  Vital Signs: BP (!) 142/75 (BP Location: Left Arm)   Pulse (!) 108   Temp 99.1 F (37.3 C) (Oral)   Resp 20   Ht 6' 3"  (1.905 m)   Wt 96.7 kg (213 lb 3 oz)   SpO2 92%   BMI 26.65 kg/m  Pain Assessment: No/denies pain   Pain Score: Asleep   SpO2: SpO2: 92 % O2 Device:SpO2: 92 % O2 Flow Rate: .O2 Flow Rate (L/min): 2 L/min  IO: Intake/output summary:  Intake/Output Summary (Last 24 hours) at 06/07/16 2301 Last data filed at 06/07/16 1900  Gross per 24 hour  Intake              220 ml  Output             1500 ml  Net            -1280 ml    LBM: Last BM Date: 06/07/16 Baseline Weight: Weight: 96.7 kg (213 lb 3 oz) Most recent weight: Weight: 96.7 kg (213 lb  3 oz)     Palliative Assessment/Data:   Flowsheet Rows   Flowsheet Row Most Recent Value  Intake Tab  Referral Department  Hospitalist  Unit at Time of Referral  Cardiac/Telemetry Unit  Palliative Care Primary Diagnosis  Cancer  Date Notified  06/05/16  Palliative Care Type  New Palliative care  Reason for referral  Clarify Goals of Care  Date of Admission  06/01/16  Date first seen by Palliative Care  06/06/16  # of days Palliative referral response time  1 Day(s)  # of days IP prior to Palliative referral  4  Clinical Assessment  Palliative Performance Scale Score  50%  Pain Max last 24 hours  5  Pain Min Last 24 hours  0  Psychosocial & Spiritual Assessment  Palliative Care Outcomes  Patient/Family meeting held?  Yes  Who was at the meeting?  patient and wife  Palliative Care Outcomes  Clarified goals of care      Time In: 4917 Time Out: 9150 Time Total:75 Greater than 50%  of this time was spent counseling and coordinating care related to the above assessment and plan.  Signed by: Micheline Rough, MD   Please contact Palliative Medicine Team phone at [phone] for questions and concerns.  For individual provider: See Shea Evans

## 2016-06-07 NOTE — Care Management Important Message (Signed)
Important Message  Patient Details  Name: Alexander Landry MRN: [id number] Date of Birth: 12-05-1932   Medicare Important Message Given:  Yes    [name] 06/07/2016, 10:23 AMImportant Message  Patient Details  Name: Alexander Landry MRN: [id number] Date of Birth: Feb 22, 1933   Medicare Important Message Given:  Yes    [name] 06/07/2016, 10:23 AM

## 2016-06-07 NOTE — Progress Notes (Signed)
Subjective: The patient is seen and examined today. His wife was at the bedside. This is a very pleasant 82 years old white male recently diagnosed with a stage IV non-small cell lung cancer, adenocarcinoma with PDL 1 expression of 10%. Other molecular studies are still pending. He was admitted to the hospital 2 times recently. First time for treatment of hyperkalemia with potassium of 8.0. The recent admission on 06/01/2016 was 4 generalized weakness and diarrhea.chest x-ray showed findings indicative of congestive heart failure. He is also being treated for questionable pneumonia. He is feeling better today but continues to have generalized weakness and fatigue. He is currently undergoing palliative radiotherapy to the brain for metastatic bone lesions. He denied having any fever or chills. He has no nausea or vomiting. His main concern is the weakness and inability to walk.  Objective: Vital signs in last 24 hours: Temp:  [98.1 F (36.7 C)-99.1 F (37.3 C)] 98.1 F (36.7 C) (11/27 1437) Pulse Rate:  [94-104] 94 (11/27 1437) Resp:  [20] 20 (11/27 1437) BP: (141-150)/(72-80) 141/72 (11/27 1437) SpO2:  [91 %-93 %] 93 % (11/27 1437)  Intake/Output from previous day: 11/26 0701 - 11/27 0700 In: 595.8 [I.V.:345.8; IV Piggyback:250] Out: 600 [Urine:600] Intake/Output this shift: Total I/O In: 100 [IV Piggyback:100] Out: 900 [Urine:900]  General appearance: alert, cooperative, appears stated age, fatigued and no distress Resp: clear to auscultation bilaterally Cardio: regular rate and rhythm, S1, S2 normal, no murmur, click, rub or gallop GI: soft, non-tender; bowel sounds normal; no masses,  no organomegaly Extremities: extremities normal, atraumatic, no cyanosis or edema  Lab Results:   Recent Labs  06/06/16 0540  WBC 11.8*  HGB 10.0*  HCT 30.5*  PLT 429*   BMET  Recent Labs  06/05/16 0616 06/06/16 0540  NA 145 145  K 4.1 3.3*  CL 112* 109  CO2 25 27  GLUCOSE 106* 146*   BUN 30* 24*  CREATININE 1.36* 1.04  CALCIUM 7.8* 7.7*    Studies/Results: No results found.  Medications: I have reviewed the patient's current medications.  CODE STATUS: no CODE BLUE  Assessment/Plan: 1) stage IV non-small cell lung cancer, adenocarcinoma with PDL1 expression of 10%. Other molecular studies are still pending. If the pending studies showed any actionable mutations, I would consider the patient for treatment with target therapy even at his current condition. If there is no actionable mutations, his treatment options will be either systemic chemotherapy if his performance status improves or palliative care and hospice. The patient is not a good candidate for treatment with immunotherapy as first-line because PDL 1 expression is less than 50% unless it is combined with chemotherapy. I will call the patient and his family with the results of the molecular studies once it becomes available. I will also arrange for the patient a follow-up appointment with cancer Center after discharge for more detailed discussion of his treatment options. 2) metastatic bone lesions: I recommended for the patient to continue his palliative radiotherapy as scheduled. 3) questionable pneumonia: continue current treatment with Zosyn. Consider changing to oral Levaquin before discharge. 4) congestive heart failure: managed by the primary team. 5) CODE STATUS: No CODE BLUE Thank you so much for taking good care of Mr. Norbeck. I will continue to follow up the patient with you and assist in his management on as-needed basis.  LOS: 6 days    Zaley Talley K. 06/07/2016

## 2016-06-08 ENCOUNTER — Telehealth: Payer: Self-pay | Admitting: Family Medicine

## 2016-06-08 ENCOUNTER — Ambulatory Visit
Admission: RE | Admit: 2016-06-08 | Discharge: 2016-06-08 | Disposition: A | Discharge status: Home / Self Care | Payer: Commercial Managed Care - HMO | Source: Ambulatory Visit | Attending: Radiation Oncology | Admitting: Radiation Oncology

## 2016-06-08 DIAGNOSIS — J189 Pneumonia, unspecified organism: Secondary | ICD-10-CM

## 2016-06-08 LAB — GLUCOSE, CAPILLARY: GLUCOSE-CAPILLARY: 130 mg/dL — AB (ref 65–99)

## 2016-06-08 MED ORDER — AMOXICILLIN-POT CLAVULANATE 875-125 MG PO TABS: 1.0000 | ORAL_TABLET | Freq: Two times a day (BID) | ORAL | 0 refills | Status: DC

## 2016-06-08 NOTE — Telephone Encounter (Signed)
Called and spoke to wife to check in and see how things are. She states that she is existing and things are confusing right now. She also poke of her health concerns which I will address in a separate message under her. She states that Alexander Landry will probably be moving to [place] home soon. She states that right now he can't stand or walk and has gone down very fast. She stated that she is relying on her son to help with everything including some of the decisions she is making.

## 2016-06-08 NOTE — Progress Notes (Signed)
Discharge instructions given to [facility] staff. All questions answered. [facility] will be transporting patient to [facility]. Family at bedside and aware.

## 2016-06-08 NOTE — Clinical Social Work Placement (Signed)
Patient is set to discharge to [facility] today, [facility] authorization obtained through [place] (#: [id number]). Patient & wife at bedside made aware. Discharge packet given to RN, [place]. [facility] called for transport.     Raynaldo Opitz, [facility] Clinical Social Worker cell #: [phone]    CLINICAL SOCIAL WORK PLACEMENT  NOTE  Date:  06/08/2016  Patient Details  Name: Alexander Landry MRN: [id number] Date of Birth: Jan 29, 1933  Clinical Social Work is seeking post-discharge placement for this patient at the [place] level of care (*CSW will initial, date and re-position this form in  chart as items are completed):  Yes   Patient/family provided with [place] Work Department's list of facilities offering this level of care within the geographic area requested by the patient (or if unable, by the patient's family).  Yes   Patient/family informed of their freedom to choose among providers that offer the needed level of care, that participate in Medicare, Medicaid or managed care program needed by the patient, have an available bed and are willing to accept the patient.  Yes   Patient/family informed of [place] [facility]'s ownership interest in [facility] and [facility], as well as of the fact that they are under no obligation to receive care at these facilities.  PASRR submitted to EDS on 06/08/16     PASRR number received on 06/08/16     Existing PASRR number confirmed on       FL2 transmitted to all facilities in geographic area requested by pt/family on 06/08/16     FL2 transmitted to all facilities within larger geographic area on       Patient informed that his/her managed care company has contracts with or will negotiate with certain facilities, including the following:        Yes   Patient/family informed of bed offers received.  Patient chooses bed at [facility], [place]      Physician recommends and patient chooses bed at      Patient to be transferred to [facility] on 06/08/16.  Patient to be transferred to facility by [facility]     Patient family notified on 06/08/16 of transfer.  Name of family member notified:  patient's wife at bedside     PHYSICIAN       Additional Comment:    _______________________________________________ Standley Brooking, LCSW 06/08/2016, 1:45 PM

## 2016-06-08 NOTE — Consult Note (Signed)
   [facility] CM Inpatient Consult   06/08/2016  Chrsitopher Wik [facility] 27-Feb-1933 [id number]     Patient screened for potential [facility] Care Management services. Chart reviewed. Noted current discharge plan is for SNF. Also noted palliative medicine consult notes.  There are no identifiable [facility] Care Management needs at this time. If patient's post hospital needs change, please place a [facility] Care Management consult. For questions please contact:  Marthenia Rolling, [place], RN,BSN [facility] Liaison [phone]

## 2016-06-08 NOTE — Discharge Summary (Signed)
Physician Discharge Summary  Alexander Landry ZDG:[id number] DOB: 1932-12-04 DOA: 06/01/2016  PCP: [name], MD  Admit date: 06/01/2016 Discharge date: 06/08/2016  Admitted From:home Disposition:SNF  Recommendations for Outpatient Follow-up:  1. Follow up with PCP in 1-2 weeks 2. Check lab CBC, BMP in one week and follow up with PCP.  Home Health: going to SNF Equipment/Devices:oxygen as needed Discharge Condition: stable CODE STATUS:DNR Diet recommendation:Carl a modified heart healthy.  Brief/Interim Summary:82 year old male with hypertension, diabetes mellitus, Hodgkin's lymphoma status post systemic chemotherapy about 20 years back, recently diagnosed metastatic bronchogenic adenocarcinoma, hospitalized at [facility] with acute renal failure and pneumonia and ongoing fever (felt secondary to cancer) and was discharged home on 11/20. He was started on palliative radiation during the hospitalization. He was sent to the ED after completion of radiation with generalized weakness and 2 episodes of watery diarrhea. On admission, patient was septic with fever of 100.65F, hypotensive with blood pressure of 87/65 mmHg, hypoxic and tachypnea. Blood work showed leukocytosis with WBC of 16.2, potassium of 3.4, AKi with creatinine 1.37 and elevated lactic acid of 2.89.  Please see below for detail:  # Sepsis, suspected aspiration pneumonia: Treated with IV zosyn and plan to discharge with oral augmentin. Clinically improving. On 2 liters of oxygen for acute hypoxic respiratory failure. He is going to SNF. Discharging with 2 liters of oxygen with plan to wean off gradually. CT scan of abdomen and pelvis showing right lower lobe pneumonia.   #Adenocarcinoma of right lung, stage 4 ([place]), with bony metastases: Currently receiving palliative radiation treatment and radiation oncologist.  Discussed with Dr. Julien Nordmann yesterday, waiting for molecular study from the biopsy. Advised to  follow-up with Dr. Julien Nordmann as an outpatient. Further treatment and plan of care depending on the test result and oncologist. -Palliative care evaluated the patient. Outpatient palliative care referred.  #Diarrhea resolved   # Diabetes mellitus type 2 with complications [facility]): Monitor blood sugar level. Resumed home medication. Advised to follow-up with PCP.   # Protein-calorie malnutrition, severe: Dietary referral and supplement. On potassium supplement for hypokalemia.  #Acute encephalopathy: MRI  unremarkable. As per family he has baseline dementia. Mental status improving.    #  AKI (acute kidney injury) [facility]): Serum creatinine level improved.  Patient is receiving radiation therapy by radiation oncologist. He is weak but clinically stable to transfer his care to SNF with oral antibiotics. I discussed with the patient and his wife at bedside in detail. I recommended to continue follow-up with oncologist, radiation oncologist, PCP. Encourage oral intake. I discussed with the social worker regarding the discharge plan.  Discharge Diagnoses:  Active Problems:   Diabetes mellitus type 2 with complications (HCC)   Adenocarcinoma of right lung, stage 4 (HCC)   Protein-calorie malnutrition, severe   Hypotension   Bone metastases (HCC)   AKI (acute kidney injury) [facility])    Discharge Instructions  Discharge Instructions    Amb Referral to Palliative Care    Complete by:  As directed    Call MD for:  difficulty breathing, headache or visual disturbances    Complete by:  As directed    Call MD for:  extreme fatigue    Complete by:  As directed    Call MD for:  hives    Complete by:  As directed    Call MD for:  persistant dizziness or light-headedness    Complete by:  As directed    Call MD for:  persistant nausea and vomiting  Complete by:  As directed    Call MD for:  severe uncontrolled pain    Complete by:  As directed    Call MD for:  temperature >100.4    Complete by:  As  directed    Diet - low sodium heart healthy    Complete by:  As directed    For home use only DME oxygen    Complete by:  As directed    Oxygen 1-2 liters/mininutes maintaining o2 sat >90 %. Can wean off oxygen at SNF.   Mode or (Route):  Nasal cannula   Liters per Minute:  2   Frequency:  Continuous (stationary and portable oxygen unit needed)   Oxygen conserving device:  Yes   Oxygen delivery system:  Gas   Increase activity slowly    Complete by:  As directed        Medication List    STOP taking these medications   amLODipine 5 MG tablet Commonly known as:  NORVASC   amoxicillin 500 MG tablet Commonly known as:  AMOXIL     TAKE these medications   acetaminophen 500 MG tablet Commonly known as:  TYLENOL Take 1 tablet (500 mg total) by mouth every 4 (four) hours as needed for fever, headache or mild pain.   amoxicillin-clavulanate 875-125 MG tablet Commonly known as:  AUGMENTIN Take 1 tablet by mouth 2 (two) times daily.   atenolol 50 MG tablet Commonly known as:  TENORMIN Take 1 tablet (50 mg total) by mouth 2 (two) times daily.   dronabinol 2.5 MG capsule Commonly known as:  MARINOL Take 2.5 mg by mouth 2 (two) times daily.   feeding supplement (ENSURE ENLIVE) Liqd Take 237 mLs by mouth 2 (two) times daily between meals.   hyaluronate sodium Gel Apply 1 application topically 2 (two) times daily. Apply to b/l shoulders and sternum daily after radiation and bedtime   HYDROcodone-acetaminophen 7.5-325 MG tablet Commonly known as:  NORCO Take 1 tablet by mouth every 4 (four) hours as needed for moderate pain.   metFORMIN 500 MG tablet Commonly known as:  GLUCOPHAGE Take 1 tablet (500 mg total) by mouth 2 (two) times daily.   mirtazapine 15 MG tablet Commonly known as:  REMERON Take 1 tablet (15 mg total) by mouth at bedtime.   multivitamin with minerals tablet Take 1 tablet by mouth daily.   omeprazole 20 MG capsule Commonly known as:  PRILOSEC TAKE  1 CAPSULE EVERY DAY   pioglitazone 30 MG tablet Commonly known as:  ACTOS 1 tablet daily   potassium chloride 10 MEQ tablet Commonly known as:  K-DUR Take 2 tablets (20 mEq total) by mouth daily.   simvastatin 20 MG tablet Commonly known as:  ZOCOR Take 1 tablet (20 mg total) by mouth daily at 6 PM.            Durable Medical Equipment        Start     Ordered   06/08/16 0000  For home use only DME oxygen    Comments:  Oxygen 1-2 liters/mininutes maintaining o2 sat >90 %. Can wean off oxygen at SNF.  Question Answer Comment  Mode or (Route) Nasal cannula   Liters per Minute 2   Frequency Continuous (stationary and portable oxygen unit needed)   Oxygen conserving device Yes   Oxygen delivery system Gas      06/08/16 1229     Follow-up Information    [name], MD. Schedule an appointment as  soon as possible for a visit in 1 week(s).   Specialty:  Family Medicine Contact information: [place] [address] [phone]        Eilleen Kempf., MD. Schedule an appointment as soon as possible for a visit in 1 week(s).   Specialty:  Oncology Contact information: [place] [address] [phone]        Kyung Rudd, MD Follow up.   Specialty:  Radiation Oncology Why:  for radiation therapy as scheduled. Contact information: [place] [place] [address] [phone]          No Known Allergies  Consultations: Oncologist, radiation oncologist, palliative care  Procedures/Studies: Radiation therapy  Subjective: Patient was seen and examined at bedside. Patient was alert awake and following commands. Feels weak. Denied nausea, vomiting, headache, chest pain or shortness of breath. Patient's wife at bedside.   Discharge Exam: Vitals:   06/07/16 2146 06/08/16 0628  BP: (!) 142/75 130/80  Pulse: (!) 108 99  Resp: 20 20  Temp: 99.1 F (37.3 C) 98.8 F (37.1 C)   Vitals:   06/06/16  2137 06/07/16 1437 06/07/16 2146 06/08/16 0628  BP: (!) 150/80 (!) 141/72 (!) 142/75 130/80  Pulse: (!) 104 94 (!) 108 99  Resp: '20 20 20 20  '$ Temp: 99.1 F (37.3 C) 98.1 F (36.7 C) 99.1 F (37.3 C) 98.8 F (37.1 C)  TempSrc: Oral Oral Oral Oral  SpO2: 91% 93% 92% 95%  Weight:      Height:        General: Pleasant elderly male lying on bed. Not in acute distress Cardiovascular: RRR, S1/S2 +, no rubs, no gallops Respiratory: CTA bilaterally, no wheezing, no rhonchi Abdominal: Soft, NT, ND, bowel sounds + Extremities: no edema, no cyanosis Neuro: Alert, awake, following commands.   The results of significant diagnostics from this hospitalization (including imaging, microbiology, ancillary and laboratory) are listed below for reference.     Microbiology: Recent Results (from the past 240 hour(s))  Urine culture     Status: None   Collection Time: 06/01/16  3:40 PM  Result Value Ref Range Status   Specimen Description URINE, CLEAN CATCH  Final   Special Requests NONE  Final   Culture NO GROWTH Performed at [facility]   Final   Report Status 06/03/2016 FINAL  Final  Culture, blood (routine x 2)     Status: None   Collection Time: 06/01/16  3:50 PM  Result Value Ref Range Status   Specimen Description BLOOD LEFT ANTECUBITAL  Final   Special Requests BOTTLES DRAWN AEROBIC AND ANAEROBIC 5CC  Final   Culture   Final    NO GROWTH 5 DAYS Performed at [facility]    Report Status 06/06/2016 FINAL  Final  Culture, blood (routine x 2)     Status: None   Collection Time: 06/01/16  4:12 PM  Result Value Ref Range Status   Specimen Description BLOOD RIGHT ANTECUBITAL  Final   Special Requests BOTTLES DRAWN AEROBIC AND ANAEROBIC 5CC  Final   Culture   Final    NO GROWTH 5 DAYS Performed at [facility]    Report Status 06/06/2016 FINAL  Final  MRSA PCR Screening     Status: None   Collection Time: 06/01/16  7:00 PM  Result Value Ref Range Status    MRSA by PCR NEGATIVE NEGATIVE Final    Comment:        The GeneXpert MRSA  Assay (FDA approved for NASAL specimens only), is one component of a comprehensive MRSA colonization surveillance program. It is not intended to diagnose MRSA infection nor to guide or monitor treatment for MRSA infections.   Culture, blood (x 2)     Status: None (Preliminary result)   Collection Time: 06/04/16  8:20 AM  Result Value Ref Range Status   Specimen Description BLOOD LEFT ARM  Final   Special Requests IN PEDIATRIC BOTTLE [place]  Final   Culture   Final    NO GROWTH 4 DAYS Performed at [facility]    Report Status PENDING  Incomplete  Culture, blood (x 2)     Status: None (Preliminary result)   Collection Time: 06/04/16  8:20 AM  Result Value Ref Range Status   Specimen Description BLOOD LEFT HAND  Final   Special Requests IN PEDIATRIC BOTTLE 4CC  Final   Culture   Final    NO GROWTH 4 DAYS Performed at [facility]    Report Status PENDING  Incomplete  Respiratory Panel by PCR     Status: None   Collection Time: 06/04/16  9:22 AM  Result Value Ref Range Status   Adenovirus NOT DETECTED NOT DETECTED Final   Coronavirus 229E NOT DETECTED NOT DETECTED Final   Coronavirus HKU1 NOT DETECTED NOT DETECTED Final   Coronavirus NL63 NOT DETECTED NOT DETECTED Final   Coronavirus OC43 NOT DETECTED NOT DETECTED Final   Metapneumovirus NOT DETECTED NOT DETECTED Final   Rhinovirus / Enterovirus NOT DETECTED NOT DETECTED Final   Influenza A NOT DETECTED NOT DETECTED Final   Influenza B NOT DETECTED NOT DETECTED Final   Parainfluenza Virus 1 NOT DETECTED NOT DETECTED Final   Parainfluenza Virus 2 NOT DETECTED NOT DETECTED Final   Parainfluenza Virus 3 NOT DETECTED NOT DETECTED Final   Parainfluenza Virus 4 NOT DETECTED NOT DETECTED Final   Respiratory Syncytial Virus NOT DETECTED NOT DETECTED Final   Bordetella pertussis NOT DETECTED NOT DETECTED Final   Chlamydophila pneumoniae NOT  DETECTED NOT DETECTED Final   Mycoplasma pneumoniae NOT DETECTED NOT DETECTED Final    Comment: Performed at [facility]  Urine culture     Status: None   Collection Time: 06/04/16  9:52 AM  Result Value Ref Range Status   Specimen Description URINE, CLEAN CATCH  Final   Special Requests Immunocompromised  Final   Culture NO GROWTH Performed at [facility]   Final   Report Status 06/05/2016 FINAL  Final     Labs: BNP (last 3 results) No results for input(s): BNP in the last 8760 hours. Basic Metabolic Panel:  Recent Labs Lab 06/02/16 0314 06/03/16 0404 06/04/16 0339 06/05/16 0616 06/06/16 0540  NA 139 139 138 145 145  K 3.4* 4.0 4.1 4.1 3.3*  CL 103 104 106 112* 109  CO2 '27 26 24 25 27  '$ GLUCOSE 168* 162* 146* 106* 146*  BUN 37* 40* 39* 30* 24*  CREATININE 1.37* 1.48* 1.52* 1.36* 1.04  CALCIUM 7.8* 8.0* 8.2* 7.8* 7.7*  MG 1.3* 2.3  --   --   --    Liver Function Tests:  Recent Labs Lab 06/01/16 1548  AST 27  ALT 39  ALKPHOS 123  BILITOT 0.9  PROT 6.8  ALBUMIN 2.6*   No results for input(s): LIPASE, AMYLASE in the last 168 hours.  Recent Labs Lab 06/03/16 1359  AMMONIA 21   CBC:  Recent Labs Lab 06/01/16 1548 06/02/16 0314 06/03/16 0404  06/04/16 0339 06/06/16 0540  WBC 22.8* 16.2* 15.2* 16.0* 11.8*  NEUTROABS 16.1*  --   --   --   --   HGB 12.1* 10.4* 9.8* 10.3* 10.0*  HCT 37.1* 31.6* 29.6* 31.6* 30.5*  MCV 96.9 98.1 95.8 96.0 97.8  PLT 318 289 295 349 429*   Cardiac Enzymes: No results for input(s): CKTOTAL, CKMB, CKMBINDEX, TROPONINI in the last 168 hours. BNP: Invalid input(s): POCBNP CBG:  Recent Labs Lab 06/07/16 0808 06/07/16 1202 06/07/16 1651 06/07/16 2237 06/08/16 0803  GLUCAP 147* 124* 127* 157* 130*   D-Dimer No results for input(s): DDIMER in the last 72 hours. Hgb A1c No results for input(s): HGBA1C in the last 72 hours. Lipid Profile No results for input(s): CHOL, HDL, LDLCALC, TRIG, CHOLHDL,  LDLDIRECT in the last 72 hours. Thyroid function studies No results for input(s): TSH, T4TOTAL, T3FREE, THYROIDAB in the last 72 hours.  Invalid input(s): FREET3 Anemia work up No results for input(s): VITAMINB12, FOLATE, FERRITIN, TIBC, IRON, RETICCTPCT in the last 72 hours. Urinalysis    Component Value Date/Time   COLORURINE YELLOW 06/04/2016 0952   APPEARANCEUR CLOUDY (A) 06/04/2016 0952   LABSPEC 1.014 06/04/2016 0952   PHURINE 5.5 06/04/2016 0952   GLUCOSEU NEGATIVE 06/04/2016 0952   HGBUR NEGATIVE 06/04/2016 0952   BILIRUBINUR NEGATIVE 06/04/2016 0952   BILIRUBINUR neg 07/03/2014 1320   KETONESUR NEGATIVE 06/04/2016 0952   PROTEINUR NEGATIVE 06/04/2016 0952   UROBILINOGEN negative 07/03/2014 1320   NITRITE NEGATIVE 06/04/2016 0952   LEUKOCYTESUR NEGATIVE 06/04/2016 9811   Sepsis Labs Invalid input(s): PROCALCITONIN,  WBC,  LACTICIDVEN Microbiology Recent Results (from the past 240 hour(s))  Urine culture     Status: None   Collection Time: 06/01/16  3:40 PM  Result Value Ref Range Status   Specimen Description URINE, CLEAN CATCH  Final   Special Requests NONE  Final   Culture NO GROWTH Performed at [facility]   Final   Report Status 06/03/2016 FINAL  Final  Culture, blood (routine x 2)     Status: None   Collection Time: 06/01/16  3:50 PM  Result Value Ref Range Status   Specimen Description BLOOD LEFT ANTECUBITAL  Final   Special Requests BOTTLES DRAWN AEROBIC AND ANAEROBIC 5CC  Final   Culture   Final    NO GROWTH 5 DAYS Performed at [facility]    Report Status 06/06/2016 FINAL  Final  Culture, blood (routine x 2)     Status: None   Collection Time: 06/01/16  4:12 PM  Result Value Ref Range Status   Specimen Description BLOOD RIGHT ANTECUBITAL  Final   Special Requests BOTTLES DRAWN AEROBIC AND ANAEROBIC 5CC  Final   Culture   Final    NO GROWTH 5 DAYS Performed at [facility]    Report Status 06/06/2016 FINAL  Final  MRSA  PCR Screening     Status: None   Collection Time: 06/01/16  7:00 PM  Result Value Ref Range Status   MRSA by PCR NEGATIVE NEGATIVE Final    Comment:        The GeneXpert MRSA Assay (FDA approved for NASAL specimens only), is one component of a comprehensive MRSA colonization surveillance program. It is not intended to diagnose MRSA infection nor to guide or monitor treatment for MRSA infections.   Culture, blood (x 2)     Status: None (Preliminary result)   Collection Time: 06/04/16  8:20 AM  Result Value Ref Range Status  Specimen Description BLOOD LEFT ARM  Final   Special Requests IN PEDIATRIC BOTTLE [place]  Final   Culture   Final    NO GROWTH 4 DAYS Performed at [facility]    Report Status PENDING  Incomplete  Culture, blood (x 2)     Status: None (Preliminary result)   Collection Time: 06/04/16  8:20 AM  Result Value Ref Range Status   Specimen Description BLOOD LEFT HAND  Final   Special Requests IN PEDIATRIC BOTTLE 4CC  Final   Culture   Final    NO GROWTH 4 DAYS Performed at [facility]    Report Status PENDING  Incomplete  Respiratory Panel by PCR     Status: None   Collection Time: 06/04/16  9:22 AM  Result Value Ref Range Status   Adenovirus NOT DETECTED NOT DETECTED Final   Coronavirus 229E NOT DETECTED NOT DETECTED Final   Coronavirus HKU1 NOT DETECTED NOT DETECTED Final   Coronavirus NL63 NOT DETECTED NOT DETECTED Final   Coronavirus OC43 NOT DETECTED NOT DETECTED Final   Metapneumovirus NOT DETECTED NOT DETECTED Final   Rhinovirus / Enterovirus NOT DETECTED NOT DETECTED Final   Influenza A NOT DETECTED NOT DETECTED Final   Influenza B NOT DETECTED NOT DETECTED Final   Parainfluenza Virus 1 NOT DETECTED NOT DETECTED Final   Parainfluenza Virus 2 NOT DETECTED NOT DETECTED Final   Parainfluenza Virus 3 NOT DETECTED NOT DETECTED Final   Parainfluenza Virus 4 NOT DETECTED NOT DETECTED Final   Respiratory Syncytial Virus NOT DETECTED NOT  DETECTED Final   Bordetella pertussis NOT DETECTED NOT DETECTED Final   Chlamydophila pneumoniae NOT DETECTED NOT DETECTED Final   Mycoplasma pneumoniae NOT DETECTED NOT DETECTED Final    Comment: Performed at [facility]  Urine culture     Status: None   Collection Time: 06/04/16  9:52 AM  Result Value Ref Range Status   Specimen Description URINE, CLEAN CATCH  Final   Special Requests Immunocompromised  Final   Culture NO GROWTH Performed at [facility]   Final   Report Status 06/05/2016 FINAL  Final     Time coordinating discharge: Over 30 minutes  SIGNED:   Rosita Fire, MD  [facility] 06/08/2016, 12:29 PM  If 7PM-7AM, please contact night-coverage [email or web address] Password [email or web address]

## 2016-06-09 ENCOUNTER — Ambulatory Visit: Payer: Commercial Managed Care - HMO

## 2016-06-09 ENCOUNTER — Ambulatory Visit
Admission: RE | Admit: 2016-06-09 | Discharge: 2016-06-09 | Disposition: A | Discharge status: Home / Self Care | Payer: Commercial Managed Care - HMO | Source: Ambulatory Visit | Attending: Radiation Oncology | Admitting: Radiation Oncology

## 2016-06-09 LAB — CULTURE, BLOOD (ROUTINE X 2)
CULTURE: NO GROWTH
Culture: NO GROWTH

## 2016-06-09 LAB — GLUCOSE, CAPILLARY: Glucose-Capillary: 154 mg/dL — ABNORMAL HIGH (ref 65–99)

## 2016-06-10 ENCOUNTER — Ambulatory Visit
Admission: RE | Admit: 2016-06-10 | Discharge: 2016-06-10 | Disposition: A | Discharge status: Home / Self Care | Payer: Commercial Managed Care - HMO | Source: Ambulatory Visit | Attending: Radiation Oncology | Admitting: Radiation Oncology

## 2016-06-11 ENCOUNTER — Encounter (HOSPITAL_COMMUNITY): Payer: Self-pay

## 2016-06-11 ENCOUNTER — Ambulatory Visit: Payer: Commercial Managed Care - HMO

## 2016-06-11 ENCOUNTER — Emergency Department (HOSPITAL_COMMUNITY): Payer: Commercial Managed Care - HMO

## 2016-06-11 ENCOUNTER — Inpatient Hospital Stay (HOSPITAL_COMMUNITY)
Admission: EM | Admit: 2016-06-11 | Discharge: 2016-06-14 | DRG: 871 | Disposition: A | Discharge status: Skilled Nursing Facility | Payer: Commercial Managed Care - HMO | Attending: Internal Medicine | Admitting: Internal Medicine

## 2016-06-11 ENCOUNTER — Encounter: Payer: Self-pay | Admitting: Radiation Oncology

## 2016-06-11 ENCOUNTER — Ambulatory Visit
Admission: RE | Admit: 2016-06-11 | Discharge: 2016-06-11 | Disposition: A | Discharge status: Home / Self Care | Payer: Commercial Managed Care - HMO | Source: Ambulatory Visit | Attending: Radiation Oncology | Admitting: Radiation Oncology

## 2016-06-11 DIAGNOSIS — Z7984 Long term (current) use of oral hypoglycemic drugs: Secondary | ICD-10-CM | POA: Diagnosis not present

## 2016-06-11 DIAGNOSIS — E860 Dehydration: Secondary | ICD-10-CM | POA: Diagnosis present

## 2016-06-11 DIAGNOSIS — E118 Type 2 diabetes mellitus with unspecified complications: Secondary | ICD-10-CM | POA: Diagnosis present

## 2016-06-11 DIAGNOSIS — A419 Sepsis, unspecified organism: Principal | ICD-10-CM | POA: Diagnosis present

## 2016-06-11 DIAGNOSIS — Z515 Encounter for palliative care: Secondary | ICD-10-CM | POA: Diagnosis not present

## 2016-06-11 DIAGNOSIS — R6510 Systemic inflammatory response syndrome (SIRS) of non-infectious origin without acute organ dysfunction: Secondary | ICD-10-CM

## 2016-06-11 DIAGNOSIS — E876 Hypokalemia: Secondary | ICD-10-CM | POA: Diagnosis present

## 2016-06-11 DIAGNOSIS — Z6824 Body mass index (BMI) 24.0-24.9, adult: Secondary | ICD-10-CM

## 2016-06-11 DIAGNOSIS — E872 Acidosis, unspecified: Secondary | ICD-10-CM

## 2016-06-11 DIAGNOSIS — R197 Diarrhea, unspecified: Secondary | ICD-10-CM | POA: Diagnosis present

## 2016-06-11 DIAGNOSIS — E43 Unspecified severe protein-calorie malnutrition: Secondary | ICD-10-CM | POA: Diagnosis present

## 2016-06-11 DIAGNOSIS — C3491 Malignant neoplasm of unspecified part of right bronchus or lung: Secondary | ICD-10-CM | POA: Diagnosis present

## 2016-06-11 DIAGNOSIS — C7951 Secondary malignant neoplasm of bone: Secondary | ICD-10-CM | POA: Diagnosis present

## 2016-06-11 DIAGNOSIS — Z87891 Personal history of nicotine dependence: Secondary | ICD-10-CM | POA: Diagnosis not present

## 2016-06-11 DIAGNOSIS — R05 Cough: Secondary | ICD-10-CM

## 2016-06-11 DIAGNOSIS — I959 Hypotension, unspecified: Secondary | ICD-10-CM | POA: Diagnosis not present

## 2016-06-11 DIAGNOSIS — Z8571 Personal history of Hodgkin lymphoma: Secondary | ICD-10-CM | POA: Diagnosis not present

## 2016-06-11 DIAGNOSIS — I471 Supraventricular tachycardia, unspecified: Secondary | ICD-10-CM

## 2016-06-11 DIAGNOSIS — Y95 Nosocomial condition: Secondary | ICD-10-CM | POA: Diagnosis present

## 2016-06-11 DIAGNOSIS — Z66 Do not resuscitate: Secondary | ICD-10-CM | POA: Diagnosis present

## 2016-06-11 DIAGNOSIS — K219 Gastro-esophageal reflux disease without esophagitis: Secondary | ICD-10-CM | POA: Diagnosis present

## 2016-06-11 DIAGNOSIS — J189 Pneumonia, unspecified organism: Secondary | ICD-10-CM | POA: Diagnosis present

## 2016-06-11 DIAGNOSIS — J9601 Acute respiratory failure with hypoxia: Secondary | ICD-10-CM | POA: Diagnosis present

## 2016-06-11 DIAGNOSIS — N179 Acute kidney failure, unspecified: Secondary | ICD-10-CM | POA: Diagnosis present

## 2016-06-11 DIAGNOSIS — Z79899 Other long term (current) drug therapy: Secondary | ICD-10-CM | POA: Diagnosis not present

## 2016-06-11 DIAGNOSIS — J440 Chronic obstructive pulmonary disease with acute lower respiratory infection: Secondary | ICD-10-CM | POA: Diagnosis present

## 2016-06-11 DIAGNOSIS — F0390 Unspecified dementia without behavioral disturbance: Secondary | ICD-10-CM | POA: Diagnosis present

## 2016-06-11 DIAGNOSIS — I10 Essential (primary) hypertension: Secondary | ICD-10-CM | POA: Diagnosis present

## 2016-06-11 DIAGNOSIS — R059 Cough, unspecified: Secondary | ICD-10-CM

## 2016-06-11 LAB — GLUCOSE, CAPILLARY: GLUCOSE-CAPILLARY: 138 mg/dL — AB (ref 65–99)

## 2016-06-11 LAB — CBC WITH DIFFERENTIAL/PLATELET
BASOS PCT: 0 %
Basophils Absolute: 0.1 10*3/uL (ref 0.0–0.1)
EOS ABS: 0 10*3/uL (ref 0.0–0.7)
EOS PCT: 0 %
HCT: 34.4 % — ABNORMAL LOW (ref 39.0–52.0)
Hemoglobin: 10.9 g/dL — ABNORMAL LOW (ref 13.0–17.0)
LYMPHS ABS: 2.7 10*3/uL (ref 0.7–4.0)
Lymphocytes Relative: 14 %
MCH: 31.5 pg (ref 26.0–34.0)
MCHC: 31.7 g/dL (ref 30.0–36.0)
MCV: 99.4 fL (ref 78.0–100.0)
MONO ABS: 2.3 10*3/uL — AB (ref 0.1–1.0)
MONOS PCT: 12 %
Neutro Abs: 14.2 10*3/uL — ABNORMAL HIGH (ref 1.7–7.7)
Neutrophils Relative %: 74 %
PLATELETS: 584 10*3/uL — AB (ref 150–400)
RBC: 3.46 MIL/uL — ABNORMAL LOW (ref 4.22–5.81)
RDW: 14.9 % (ref 11.5–15.5)
WBC: 19.3 10*3/uL — ABNORMAL HIGH (ref 4.0–10.5)

## 2016-06-11 LAB — COMPREHENSIVE METABOLIC PANEL
ALK PHOS: 88 U/L (ref 38–126)
ALT: 23 U/L (ref 17–63)
AST: 17 U/L (ref 15–41)
Albumin: 2.2 g/dL — ABNORMAL LOW (ref 3.5–5.0)
Anion gap: 13 (ref 5–15)
BUN: 28 mg/dL — AB (ref 6–20)
CALCIUM: 8.2 mg/dL — AB (ref 8.9–10.3)
CHLORIDE: 103 mmol/L (ref 101–111)
CO2: 25 mmol/L (ref 22–32)
CREATININE: 1.19 mg/dL (ref 0.61–1.24)
GFR calc Af Amer: >60 mL/min (ref >60)
GFR calc non Af Amer: 55 mL/min — ABNORMAL LOW (ref >60)
Glucose, Bld: 154 mg/dL — ABNORMAL HIGH (ref 65–99)
Potassium: 3.2 mmol/L — ABNORMAL LOW (ref 3.5–5.1)
SODIUM: 141 mmol/L (ref 135–145)
Total Bilirubin: 1.1 mg/dL (ref 0.3–1.2)
Total Protein: 6.7 g/dL (ref 6.5–8.1)

## 2016-06-11 LAB — URINALYSIS, ROUTINE W REFLEX MICROSCOPIC
Glucose, UA: NEGATIVE mg/dL
Ketones, ur: NEGATIVE mg/dL
LEUKOCYTES UA: NEGATIVE
NITRITE: NEGATIVE
PROTEIN: 30 mg/dL — AB
Specific Gravity, Urine: 1.028 (ref 1.005–1.030)
pH: 5 (ref 5.0–8.0)

## 2016-06-11 LAB — URINE MICROSCOPIC-ADD ON

## 2016-06-11 LAB — I-STAT CG4 LACTIC ACID, ED
Lactic Acid, Venous: 2.18 mmol/L (ref 0.5–1.9)
Lactic Acid, Venous: 2.33 mmol/L (ref 0.5–1.9)

## 2016-06-11 MED ORDER — SODIUM CHLORIDE 0.9 % IV BOLUS (SEPSIS)
1000.0000 mL | Freq: Once | INTRAVENOUS | Status: AC
  Administered 2016-06-11: 1000 mL via INTRAVENOUS

## 2016-06-11 MED ORDER — IPRATROPIUM BROMIDE 0.02 % IN SOLN
0.5000 mg | Freq: Four times a day (QID) | RESPIRATORY_TRACT | Status: DC
  Administered 2016-06-12: 0.5 mg via RESPIRATORY_TRACT
  Filled 2016-06-11 (×2): qty 2.5

## 2016-06-11 MED ORDER — ONDANSETRON HCL 4 MG/2ML IJ SOLN: 4.0000 mg | Freq: Four times a day (QID) | INTRAMUSCULAR | Status: DC | PRN

## 2016-06-11 MED ORDER — SODIUM CHLORIDE 0.9% FLUSH
3.0000 mL | Freq: Two times a day (BID) | INTRAVENOUS | Status: DC
  Administered 2016-06-11 – 2016-06-14 (×5): 3 mL via INTRAVENOUS

## 2016-06-11 MED ORDER — SODIUM CHLORIDE 0.9 % IV BOLUS (SEPSIS)
500.0000 mL | Freq: Once | INTRAVENOUS | Status: AC
  Administered 2016-06-11: 500 mL via INTRAVENOUS

## 2016-06-11 MED ORDER — INSULIN ASPART 100 UNIT/ML ~~LOC~~ SOLN: 0.0000 [IU] | Freq: Every day | SUBCUTANEOUS | Status: DC

## 2016-06-11 MED ORDER — ACETAMINOPHEN 650 MG RE SUPP
650.0000 mg | Freq: Four times a day (QID) | RECTAL | Status: DC | PRN
  Administered 2016-06-12 – 2016-06-13 (×2): 650 mg via RECTAL
  Filled 2016-06-11 (×2): qty 1

## 2016-06-11 MED ORDER — ENOXAPARIN SODIUM 40 MG/0.4ML ~~LOC~~ SOLN
40.0000 mg | SUBCUTANEOUS | Status: DC
  Administered 2016-06-11 – 2016-06-13 (×3): 40 mg via SUBCUTANEOUS
  Filled 2016-06-11 (×3): qty 0.4

## 2016-06-11 MED ORDER — ENSURE ENLIVE PO LIQD
237.0000 mL | Freq: Two times a day (BID) | ORAL | Status: DC
  Administered 2016-06-12 (×2): 237 mL via ORAL

## 2016-06-11 MED ORDER — GUAIFENESIN ER 600 MG PO TB12
600.0000 mg | ORAL_TABLET | Freq: Two times a day (BID) | ORAL | Status: DC
  Administered 2016-06-11 – 2016-06-13 (×2): 600 mg via ORAL
  Filled 2016-06-11 (×4): qty 1

## 2016-06-11 MED ORDER — PIPERACILLIN-TAZOBACTAM 3.375 G IVPB 30 MIN
3.3750 g | Freq: Once | INTRAVENOUS | Status: AC
  Administered 2016-06-11: 3.375 g via INTRAVENOUS
  Filled 2016-06-11: qty 50

## 2016-06-11 MED ORDER — PIPERACILLIN-TAZOBACTAM 3.375 G IVPB
3.3750 g | Freq: Three times a day (TID) | INTRAVENOUS | Status: DC
  Administered 2016-06-11 – 2016-06-14 (×8): 3.375 g via INTRAVENOUS
  Filled 2016-06-11 (×8): qty 50

## 2016-06-11 MED ORDER — HYDROCODONE-ACETAMINOPHEN 7.5-325 MG PO TABS: 1.0000 | ORAL_TABLET | ORAL | Status: DC | PRN

## 2016-06-11 MED ORDER — VANCOMYCIN HCL 125 MG PO CAPS: 125.0000 mg | ORAL_CAPSULE | Freq: Four times a day (QID) | ORAL | Status: DC

## 2016-06-11 MED ORDER — VANCOMYCIN HCL IN DEXTROSE 1-5 GM/200ML-% IV SOLN
1000.0000 mg | Freq: Once | INTRAVENOUS | Status: AC
  Administered 2016-06-11: 1000 mg via INTRAVENOUS
  Filled 2016-06-11: qty 200

## 2016-06-11 MED ORDER — POTASSIUM CHLORIDE ER 10 MEQ PO TBCR
20.0000 meq | EXTENDED_RELEASE_TABLET | Freq: Every day | ORAL | Status: DC
  Filled 2016-06-11: qty 2

## 2016-06-11 MED ORDER — SODIUM CHLORIDE 0.9 % IV SOLN
INTRAVENOUS | Status: AC
  Administered 2016-06-11: 22:00:00 via INTRAVENOUS

## 2016-06-11 MED ORDER — VANCOMYCIN HCL IN DEXTROSE 750-5 MG/150ML-% IV SOLN
750.0000 mg | Freq: Once | INTRAVENOUS | Status: AC
  Administered 2016-06-11: 750 mg via INTRAVENOUS
  Filled 2016-06-11: qty 150

## 2016-06-11 MED ORDER — ONDANSETRON HCL 4 MG PO TABS
4.0000 mg | ORAL_TABLET | Freq: Four times a day (QID) | ORAL | Status: DC | PRN
Start: 2016-06-11 — End: 2016-06-14

## 2016-06-11 MED ORDER — SIMVASTATIN 20 MG PO TABS
20.0000 mg | ORAL_TABLET | Freq: Every day | ORAL | Status: DC
  Administered 2016-06-11: 20 mg via ORAL
  Filled 2016-06-11: qty 1

## 2016-06-11 MED ORDER — INSULIN ASPART 100 UNIT/ML ~~LOC~~ SOLN
0.0000 [IU] | Freq: Three times a day (TID) | SUBCUTANEOUS | Status: DC
  Administered 2016-06-12: 1 [IU] via SUBCUTANEOUS
  Administered 2016-06-12 – 2016-06-13 (×2): 2 [IU] via SUBCUTANEOUS

## 2016-06-11 MED ORDER — ACETAMINOPHEN 325 MG PO TABS: 650.0000 mg | ORAL_TABLET | Freq: Four times a day (QID) | ORAL | Status: DC | PRN

## 2016-06-11 MED ORDER — VANCOMYCIN HCL IN DEXTROSE 1-5 GM/200ML-% IV SOLN
1000.0000 mg | Freq: Two times a day (BID) | INTRAVENOUS | Status: DC
  Administered 2016-06-12 – 2016-06-14 (×5): 1000 mg via INTRAVENOUS
  Filled 2016-06-11 (×4): qty 200

## 2016-06-11 MED ORDER — LEVALBUTEROL HCL 0.63 MG/3ML IN NEBU: 0.6300 mg | INHALATION_SOLUTION | RESPIRATORY_TRACT | Status: DC | PRN

## 2016-06-11 MED ORDER — VANCOMYCIN 50 MG/ML ORAL SOLUTION
125.0000 mg | Freq: Four times a day (QID) | ORAL | Status: DC
  Administered 2016-06-11 – 2016-06-14 (×9): 125 mg via ORAL
  Filled 2016-06-11 (×12): qty 2.5

## 2016-06-11 MED ORDER — SODIUM CHLORIDE 0.9 % IV SOLN
30.0000 meq | Freq: Once | INTRAVENOUS | Status: AC
  Administered 2016-06-11: 30 meq via INTRAVENOUS
  Filled 2016-06-11 (×2): qty 15

## 2016-06-11 MED ORDER — MIRTAZAPINE 15 MG PO TABS
15.0000 mg | ORAL_TABLET | Freq: Every day | ORAL | Status: DC
  Administered 2016-06-11: 15 mg via ORAL
  Filled 2016-06-11: qty 1

## 2016-06-11 NOTE — H&P (Signed)
Alexander Landry [place] JSH:[id number] DOB: Apr 11, 1933 DOA: 06/11/2016     PCP: [name], MD   Outpatient Specialists: Oncology [place], [place] Patient coming from:  From facility [facility]  Chief Complaint: fever  HPI: Alexander Landry is a 82 y.o. male with medical history significant of HTN, diabetes mellitus, Hodgkin's lymphoma status post systemic chemotherapy about 20 years back, recently diagnosed metastatic bronchogenic adenocarcinoma , dementia    Presented with fever which was noted today while at radiation oncology patient does still have some cough. He was noted to be hypotensive down to 82/53 and tachycardic Patient has recurrent fevers which felt to be secondary to cancer. Patient just recently admitted secondary to sepsis due to suspected aspiration pneumonia he was treated with Zosyn and was discharged on 28th of November on oral Augmentin to nursing home facility at baseline currently on 2 L of oxygen. Patient has outpatient palliative care referral  Of note patient is DO NOT RESUSCITATE was recently reversed a full code Regarding pertinent Chronic problems: Examination had had recurrent hospitalizations: Pneumonia and acute renal failure regarding history of lung cancers followed up by Dr. [name] he has rapid progressive decline Regarding bone metastases patient is currently on palliative radiation. She has known history of diabetes well managed and protein calorie malnutrition which is severe  IN ER:  Temp (24hrs), Avg:99.7 F (37.6 C), Min:98.4 F (36.9 C), Max:101 F (38.3 C)      RR 23 satting 94% on 4 L HR 107 BP 120/68 Lactic acid 2.33 WBC 19.3 up from 11.8 at discharge k 3.2 cr 1.19 CXR small to moderate right pleural effusion with right lower lobe Alexander Landry's versus consolidation Following Medications were ordered in ER: Medications  vancomycin (VANCOCIN) IVPB 1000 mg/200 mL premix (not administered)  piperacillin-tazobactam (ZOSYN) IVPB  3.375 g (not administered)  sodium chloride 0.9 % bolus 1,000 mL (0 mLs Intravenous Stopped 06/11/16 1651)    And  sodium chloride 0.9 % bolus 1,000 mL (0 mLs Intravenous Stopped 06/11/16 1735)    And  sodium chloride 0.9 % bolus 1,000 mL (0 mLs Intravenous Stopped 06/11/16 1807)  piperacillin-tazobactam (ZOSYN) IVPB 3.375 g (0 g Intravenous Stopped 06/11/16 1659)  vancomycin (VANCOCIN) IVPB 1000 mg/200 mL premix (0 mg Intravenous Stopped 06/11/16 1704)  vancomycin (VANCOCIN) IVPB 750 mg/150 ml premix (750 mg Intravenous Given 06/11/16 1704)     Hospitalist was called for admission for Healthcare acquired pneumonia and sepsis  Review of Systems:    Pertinent positives include: Fevers, chills, fatigue, excess mucus, productive cough,  Constitutional:  No weight loss, night sweats,  weight loss  HEENT:  No headaches, Difficulty swallowing,Tooth/dental problems,Sore throat,  No sneezing, itching, ear ache, nasal congestion, post nasal drip,  Cardio-vascular:  No chest pain, Orthopnea, PND, anasarca, dizziness, palpitations.no Bilateral lower extremity swelling  GI:  No heartburn, indigestion, abdominal pain, nausea, vomiting, diarrhea, change in bowel habits, loss of appetite, melena, blood in stool, hematemesis Resp:  no shortness of breath at rest. No dyspnea on exertion, No , no  No non-productive cough, No coughing up of blood.No change in color of mucus.No wheezing. Skin:  no rash or lesions. No jaundice GU:  no dysuria, change in color of urine, no urgency or frequency. No straining to urinate.  No flank pain.  Musculoskeletal:  No joint pain or no joint swelling. No decreased range of motion. No back pain.  Psych:  No change in mood or affect. No depression or anxiety. No memory loss.  Neuro: no localizing neurological complaints, no tingling, no weakness, no double vision, no gait abnormality, no slurred speech, no confusion  As per HPI otherwise 10 point review of systems  negative.   Past Medical History: Past Medical History:  Diagnosis Date  . Adenocarcinoma of right lung, stage 4 ([place]) 05/24/2016  . Diabetes mellitus without complication ([place])   . GERD (gastroesophageal reflux disease)   . Hypertension   . Kyphosis (acquired) (postural)   . Lung cancer ([place]) 05/10/2016   right lower lobe lung=adenocarcinoma  . Medical history non-contributory    History reviewed. No pertinent surgical history.   Social History:  Ambulatory  walker       reports that he quit smoking about 30 years ago. His smoking use included Cigarettes. He has a 40.00 pack-year smoking history. He has never used smokeless tobacco. He reports that he does not drink alcohol or use drugs.  Allergies:  No Known Allergies     Family History:   Family History  Problem Relation Age of Onset  . Kidney cancer Mother     Medications: Prior to Admission medications   Medication Sig Start Date End Date Taking? Authorizing Provider  amoxicillin-clavulanate (AUGMENTIN) 875-125 MG tablet Take 1 tablet by mouth 2 (two) times daily. 06/08/16 06/13/16 Yes Dron Tanna Furry, MD  atenolol (TENORMIN) 50 MG tablet Take 1 tablet (50 mg total) by mouth 2 (two) times daily. Patient taking differently: Take 50 mg by mouth daily.  04/12/16  Yes Denita Lung, MD  dronabinol (MARINOL) 2.5 MG capsule Take 2.5 mg by mouth 2 (two) times daily. 05/26/16  Yes Historical Provider, MD  feeding supplement, ENSURE ENLIVE, (ENSURE ENLIVE) LIQD Take 237 mLs by mouth 2 (two) times daily between meals. 06/01/16  Yes Eugenie Filler, MD  mirtazapine (REMERON) 15 MG tablet Take 1 tablet (15 mg total) by mouth at bedtime. 05/31/16  Yes Eugenie Filler, MD  Multiple Vitamins-Minerals (MULTIVITAMIN WITH MINERALS) tablet Take 1 tablet by mouth daily.   Yes Historical Provider, MD  omeprazole (PRILOSEC) 20 MG capsule TAKE 1 CAPSULE EVERY DAY Patient taking differently: Take 20 mg by mouth daily.  09/16/15  Yes  Denita Lung, MD  pioglitazone (ACTOS) 30 MG tablet 1 tablet daily Patient taking differently: Take 30 mg by mouth daily.  09/16/15  Yes Denita Lung, MD  Probiotic Product (ALIGN) 4 MG CAPS Take 1 capsule by mouth daily.   Yes Historical Provider, MD  simvastatin (ZOCOR) 20 MG tablet Take 1 tablet (20 mg total) by mouth daily at 6 PM. 08/25/15  Yes Denita Lung, MD  VANCOMYCIN HCL PO Take 5 mLs by mouth 4 (four) times daily. For 10 Days. ABT Start Date 06/11/16 & End Date 06/19/16.   Yes Historical Provider, MD  acetaminophen (TYLENOL) 500 MG tablet Take 1 tablet (500 mg total) by mouth every 4 (four) hours as needed for fever, headache or mild pain. 05/31/16   Eugenie Filler, MD  amLODipine (NORVASC) 5 MG tablet Take 5 mg by mouth daily. 06/01/16   Historical Provider, MD  hyaluronate sodium (RADIAPLEXRX) GEL Apply 1 application topically 2 (two) times daily. Apply to b/l shoulders and sternum daily after radiation and bedtime 06/01/16   Hayden Pedro, PA-C  HYDROcodone-acetaminophen [facility]) 7.5-325 MG tablet Take 1 tablet by mouth every 4 (four) hours as needed for moderate pain. 05/13/16   Denita Lung, MD  metFORMIN (GLUCOPHAGE) 500 MG tablet Take 1 tablet (500 mg total) by  mouth 2 (two) times daily. Patient not taking: Reported on 06/11/2016 04/05/16   Denita Lung, MD  potassium chloride (K-DUR) 10 MEQ tablet Take 2 tablets (20 mEq total) by mouth daily. 05/31/16 06/02/16  Eugenie Filler, MD    Physical Exam: Patient Vitals for the past 24 hrs:  BP Temp Temp src Pulse Resp SpO2 Height Weight  06/11/16 1844 124/68 - - 107 23 94 % - -  06/11/16 1830 132/56 - - - (!) 27 92 % - -  06/11/16 1809 109/64 - - 102 21 95 % - -  06/11/16 1736 99/66 - - 96 23 94 % - -  06/11/16 1715 105/72 - - 100 24 93 % - -  06/11/16 1645 113/56 - - 96 (!) 28 93 % - -  06/11/16 1636 116/72 101 F (38.3 C) Rectal 105 (!) 27 90 % - -  06/11/16 1600 93/67 - - (!) 147 (!) 29 95 % - -  06/11/16  1537 100/71 - - (!) 148 (!) 32 98 % '6\' 5"'$  (1.956 m) 90.7 kg (200 lb)  06/11/16 1505 (!) 82/53 98.4 F (36.9 C) Oral (!) 148 23 91 % - -  06/11/16 1458 - - - - - 90 % - -    1. General:  in No Acute distress 2. Psychological: Alert but not  Oriented 3. Head/ENT:   Dry Mucous Membranes                          Head Non traumatic, neck supple                            Poor Dentition 4. SKIN:   decreased Skin turgor,  Skin clean Dry and intact no rash 5. Heart: Regular rate and rhythm no  Murmur, Rub or gallop 6. Lungs:  no wheezes some crackles   7. Abdomen: Soft,  non-tender, Non distended 8. Lower extremities: no clubbing, cyanosis, or edema 9. Neurologically Grossly intact, moving all 4 extremities equally  10. MSK: Normal range of motion   body mass index is 23.72 kg/m.  Labs on Admission:   Labs on Admission: I have personally reviewed following labs and imaging studies  CBC:  Recent Labs Lab 06/06/16 0540 06/11/16 1531  WBC 11.8* 19.3*  NEUTROABS  --  14.2*  HGB 10.0* 10.9*  HCT 30.5* 34.4*  MCV 97.8 99.4  PLT 429* 102*   Basic Metabolic Panel:  Recent Labs Lab 06/05/16 0616 06/06/16 0540 06/11/16 1531  NA 145 145 141  K 4.1 3.3* 3.2*  CL 112* 109 103  CO2 '25 27 25  '$ GLUCOSE 106* 146* 154*  BUN 30* 24* 28*  CREATININE 1.36* 1.04 1.19  CALCIUM 7.8* 7.7* 8.2*   GFR: Estimated Creatinine Clearance: 61.4 mL/min (by C-G formula based on SCr of 1.19 mg/dL). Liver Function Tests:  Recent Labs Lab 06/11/16 1531  AST 17  ALT 23  ALKPHOS 88  BILITOT 1.1  PROT 6.7  ALBUMIN 2.2*   No results for input(s): LIPASE, AMYLASE in the last 168 hours. No results for input(s): AMMONIA in the last 168 hours. Coagulation Profile: No results for input(s): INR, PROTIME in the last 168 hours. Cardiac Enzymes: No results for input(s): CKTOTAL, CKMB, CKMBINDEX, TROPONINI in the last 168 hours. BNP (last 3 results) No results for input(s): PROBNP in the last 8760  hours. HbA1C: No results for input(s): HGBA1C  in the last 72 hours. CBG:  Recent Labs Lab 06/07/16 1202 06/07/16 1651 06/07/16 2237 06/08/16 0803 06/08/16 1216  GLUCAP 124* 127* 157* 130* 154*   Lipid Profile: No results for input(s): CHOL, HDL, LDLCALC, TRIG, CHOLHDL, LDLDIRECT in the last 72 hours. Thyroid Function Tests: No results for input(s): TSH, T4TOTAL, FREET4, T3FREE, THYROIDAB in the last 72 hours. Anemia Panel: No results for input(s): VITAMINB12, FOLATE, FERRITIN, TIBC, IRON, RETICCTPCT in the last 72 hours. Urine analysis:    Component Value Date/Time   COLORURINE AMBER (A) 06/11/2016 1621   APPEARANCEUR TURBID (A) 06/11/2016 1621   LABSPEC 1.028 06/11/2016 1621   PHURINE 5.0 06/11/2016 1621   GLUCOSEU NEGATIVE 06/11/2016 1621   HGBUR LARGE (A) 06/11/2016 1621   BILIRUBINUR MODERATE (A) 06/11/2016 1621   BILIRUBINUR neg 07/03/2014 1320   [place] 06/11/2016 1621   PROTEINUR 30 (A) 06/11/2016 1621   UROBILINOGEN negative 07/03/2014 1320   NITRITE NEGATIVE 06/11/2016 1621   LEUKOCYTESUR NEGATIVE 06/11/2016 1621   Sepsis Labs: '@LABRCNTIP'$ (procalcitonin:4,lacticidven:4) ) Recent Results (from the past 240 hour(s))  MRSA PCR Screening     Status: None   Collection Time: 06/01/16  7:00 PM  Result Value Ref Range Status   MRSA by PCR NEGATIVE NEGATIVE Final    Comment:        The GeneXpert MRSA Assay (FDA approved for NASAL specimens only), is one component of a comprehensive MRSA colonization surveillance program. It is not intended to diagnose MRSA infection nor to guide or monitor treatment for MRSA infections.   Culture, blood (x 2)     Status: None   Collection Time: 06/04/16  8:20 AM  Result Value Ref Range Status   Specimen Description BLOOD LEFT ARM  Final   Special Requests IN PEDIATRIC BOTTLE [place]  Final   Culture   Final    NO GROWTH 5 DAYS Performed at [facility]    Report Status 06/09/2016 FINAL  Final  Culture,  blood (x 2)     Status: None   Collection Time: 06/04/16  8:20 AM  Result Value Ref Range Status   Specimen Description BLOOD LEFT HAND  Final   Special Requests IN PEDIATRIC BOTTLE 4CC  Final   Culture   Final    NO GROWTH 5 DAYS Performed at [facility]    Report Status 06/09/2016 FINAL  Final  Respiratory Panel by PCR     Status: None   Collection Time: 06/04/16  9:22 AM  Result Value Ref Range Status   Adenovirus NOT DETECTED NOT DETECTED Final   Coronavirus 229E NOT DETECTED NOT DETECTED Final   Coronavirus HKU1 NOT DETECTED NOT DETECTED Final   Coronavirus NL63 NOT DETECTED NOT DETECTED Final   Coronavirus OC43 NOT DETECTED NOT DETECTED Final   Metapneumovirus NOT DETECTED NOT DETECTED Final   Rhinovirus / Enterovirus NOT DETECTED NOT DETECTED Final   Influenza A NOT DETECTED NOT DETECTED Final   Influenza B NOT DETECTED NOT DETECTED Final   Parainfluenza Virus 1 NOT DETECTED NOT DETECTED Final   Parainfluenza Virus 2 NOT DETECTED NOT DETECTED Final   Parainfluenza Virus 3 NOT DETECTED NOT DETECTED Final   Parainfluenza Virus 4 NOT DETECTED NOT DETECTED Final   Respiratory Syncytial Virus NOT DETECTED NOT DETECTED Final   Bordetella pertussis NOT DETECTED NOT DETECTED Final   Chlamydophila pneumoniae NOT DETECTED NOT DETECTED Final   Mycoplasma pneumoniae NOT DETECTED NOT DETECTED Final    Comment: Performed at [facility]  Urine culture     Status: None   Collection Time: 06/04/16  9:52 AM  Result Value Ref Range Status   Specimen Description URINE, CLEAN CATCH  Final   Special Requests Immunocompromised  Final   Culture NO GROWTH Performed at [facility]   Final   Report Status 06/05/2016 FINAL  Final      UA   no evidence of UTI    Lab Results  Component Value Date   HGBA1C 6.8 (H) 05/25/2016    Estimated Creatinine Clearance: 61.4 mL/min (by C-G formula based on SCr of 1.19 mg/dL).  BNP (last 3 results) No results for input(s):  PROBNP in the last 8760 hours.   ECG REPORT  Independently reviewed Rate: 100  Rhythm: Sinus tachycardia ST&T Change: No acute ischemic changes   QTC 480 Earlier from today EKG showing SVT with heart rate up to 147  Filed Weights   06/11/16 1537  Weight: 90.7 kg (200 lb)     Cultures:    Component Value Date/Time   SDES URINE, CLEAN CATCH 06/04/2016 0952   SPECREQUEST Immunocompromised 06/04/2016 0952   CULT NO GROWTH Performed at [facility]  06/04/2016 0923   REPTSTATUS 06/05/2016 FINAL 06/04/2016 0952     Radiological Exams on Admission: Dg Chest Port 1 View  Result Date: 06/11/2016 CLINICAL DATA:  Diaphoresis, fever, shortness of breath and cough. Sepsis protocol. History of pneumonia, RIGHT lung cancer. EXAM: PORTABLE CHEST 1 VIEW COMPARISON:  Chest radiograph June 04, 2016 and PET-CT May 03, 2016 FINDINGS: Cardiomediastinal silhouette is normal considering rotation to the LEFT. Mild chronic interstitial changes increased lung volume with small to moderate RIGHT pleural effusion extending to the minor fissure. Patchy RIGHT lower lobe airspace opacity. RIGHT hilar masslike density. Apical pleural thickening. Increased lung volumes. No pneumothorax. Soft tissue planes and included osseous structure nonsuspicious, osteopenia. IMPRESSION: Small to moderate RIGHT pleural effusion with RIGHT lower lobe atelectasis/ consolidation. Prominent RIGHT hilum most representative of lymphadenopathy considering prior chest CT. COPD. Electronically Signed   By: Elon Alas M.D.   On: 06/11/2016 16:13    Chart has been reviewed    Assessment/Plan  82 y.o. male with medical history significant of HTN, diabetes mellitus, Hodgkin's lymphoma status post systemic chemotherapy about 20 years back, recently diagnosed metastatic bronchogenic adenocarcinoma , dementia being admitted for sepsis presumably secondary to recurrent pneumonia  Present on Admission: .  Adenocarcinoma of right lung, stage 4 ([place]) progressive discuss case with oncology who will see in consult regarding overall goals of care at this point family strongly states that they wish for patient to be DO NOT RESUSCITATE but they are confused prior he was changed to full code . Bone metastases (HCC) pain management . Diabetes mellitus type 2 with complications ([place]) order sliding scale and hold by mouth medications . HCAP (healthcare-associated pneumonia) administer vancomycin and Zosyn obtain sputum cultures . Hypotension in a setting of sepsis currently improved after IV fluid resuscitation hold home antihypertensives . Protein-calorie malnutrition, severe order nutritional consult . Sepsis ([place]) - likely secondary to recurrent pneumonia Will continue broad spectrum antibiotics vancomycin and Zosyn and fluid resuscitation . Hypokalemia replace magnesium in the morning   Other plan as per orders.  DVT prophylaxis:   Lovenox     Code Status:  DNR/DNI as per family at this point they would like to hold off on palliative care consult until they discuss care with oncology  Family Communication:   Family  at  Bedside  plan of care was discussed with  Son  Wife   Disposition Plan:                              Back to current facility when stable                                                  Would benefit from PT/OT eval prior to DC  ordered                      Social Work  Nutrition                            Consults called: Oncology  Admission status:    inpatient       Level of care     tele       I have spent a total of 56 min on this admission   Nick Armel 06/11/2016, 9:31 PM    [facility]  Pager [phone]   after 2 AM please page floor coverage PA If 7AM-7PM, please contact the day team taking care of the patient  [email or web address]  Password [email or web address]

## 2016-06-11 NOTE — ED Notes (Signed)
No social worker available at this time.  Since pt is being admitted he can be seen by SW tomorrow.

## 2016-06-11 NOTE — ED Notes (Signed)
Bed: WA21 Expected date:  Expected time:  Means of arrival:  Comments: EMS-sepsis

## 2016-06-11 NOTE — ED Provider Notes (Signed)
[facility] DEPT Provider Note   CSN: [id number] Arrival date & time: 06/11/16  1448     History   Chief Complaint Chief Complaint  Patient presents with  . Hypotension    HPI Alexander Landry is a 82 y.o. male who presents with hypotension. 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 Recently admitted from 11/21-11/28 for sepsis from aspiration PNA, ARF, and generalized weakness and dehydration due to diarrhea. He was treated with Zosyn and was transitioned to Augmentin. Diarrhea had resolved and he was discharged to a SNF ([facility]). Cancer is being managed by Dr. Julien Nordmann. He has had a rapid decline in health recently and hasn't been able to walk or stand. Patient is FULL CODE (recently changed on 11/28). Level 5 caveat due to dementia. Per family, cough is new. He came for palliative radiation today and EMS found him to by hypotensive and tachycardic therefore was transferred to the ED for further evaluation.  HPI  Past Medical History:  Diagnosis Date  . Adenocarcinoma of right lung, stage 4 ([place]) 05/24/2016  . Diabetes mellitus without complication ([place])   . GERD (gastroesophageal reflux disease)   . Hypertension   . Kyphosis (acquired) (postural)   . Lung cancer ([place]) 05/10/2016   right lower lobe lung=adenocarcinoma  . Medical history non-contributory     Patient Active Problem List   Diagnosis Date Noted  . E-coli UTI 05/29/2016  . HCAP (healthcare-associated pneumonia) 05/27/2016  . Bone metastases ([place]) 05/27/2016  . Hypotension   . Protein-calorie malnutrition, severe 05/25/2016  . Adenocarcinoma of right lung, stage 4 ([place]) 05/24/2016  . Hyperkalemia 05/24/2016  . Acute kidney injury ([place]) 05/24/2016  . Lung nodules 04/23/2016  . Sternal pain 03/25/2016  . Chest pain 03/25/2016  . Spine deformity 03/25/2016  . Onychomycosis 10/23/2015  .  Presbycusis of both ears 11/01/2014  . Diabetes mellitus type 2 with complications ([place]) 07/37/1062  . Hypertension associated with diabetes ([place]) 03/29/2013  . Hyperlipidemia with target LDL less than 70 03/29/2013  . History of Hodgkin's lymphoma 03/29/2013  . History of renal stone 03/29/2013    No past surgical history on file.     Home Medications    Prior to Admission medications   Medication Sig Start Date End Date Taking? Authorizing Provider  amoxicillin-clavulanate (AUGMENTIN) 875-125 MG tablet Take 1 tablet by mouth 2 (two) times daily. 06/08/16 06/13/16 Yes Dron Tanna Furry, MD  atenolol (TENORMIN) 50 MG tablet Take 1 tablet (50 mg total) by mouth 2 (two) times daily. Patient taking differently: Take 50 mg by mouth daily.  04/12/16  Yes Denita Lung, MD  dronabinol (MARINOL) 2.5 MG capsule Take 2.5 mg by mouth 2 (two) times daily. 05/26/16  Yes Historical Provider, MD  feeding supplement, ENSURE ENLIVE, (ENSURE ENLIVE) LIQD Take 237 mLs by mouth 2 (two) times daily between meals. 06/01/16  Yes Eugenie Filler, MD  mirtazapine (REMERON) 15 MG tablet Take 1 tablet (15 mg total) by mouth at bedtime. 05/31/16  Yes Eugenie Filler, MD  Multiple Vitamins-Minerals (MULTIVITAMIN WITH MINERALS) tablet Take 1 tablet by mouth daily.   Yes Historical Provider, MD  omeprazole (PRILOSEC) 20 MG capsule TAKE 1 CAPSULE EVERY DAY Patient taking differently: Take 20 mg by mouth daily.  09/16/15  Yes Denita Lung, MD  pioglitazone (ACTOS) 30 MG tablet 1 tablet daily Patient taking differently: Take 30 mg by mouth daily.  09/16/15  Yes Denita Lung, MD  Probiotic Product (ALIGN) 4 MG CAPS Take 1 capsule by mouth daily.   Yes Historical Provider, MD  simvastatin (ZOCOR) 20 MG tablet Take 1 tablet (20 mg total) by mouth daily at 6 PM. 08/25/15  Yes Denita Lung, MD  VANCOMYCIN HCL PO Take 5 mLs by mouth 4 (four) times daily. For 10 Days. ABT Start Date 06/11/16 & End Date 06/19/16.   Yes  Historical Provider, MD  acetaminophen (TYLENOL) 500 MG tablet Take 1 tablet (500 mg total) by mouth every 4 (four) hours as needed for fever, headache or mild pain. 05/31/16   Eugenie Filler, MD  amLODipine (NORVASC) 5 MG tablet Take 5 mg by mouth daily. 06/01/16   Historical Provider, MD  hyaluronate sodium (RADIAPLEXRX) GEL Apply 1 application topically 2 (two) times daily. Apply to b/l shoulders and sternum daily after radiation and bedtime 06/01/16   Hayden Pedro, PA-C  HYDROcodone-acetaminophen [facility]) 7.5-325 MG tablet Take 1 tablet by mouth every 4 (four) hours as needed for moderate pain. 05/13/16   Denita Lung, MD  metFORMIN (GLUCOPHAGE) 500 MG tablet Take 1 tablet (500 mg total) by mouth 2 (two) times daily. Patient not taking: Reported on 06/11/2016 04/05/16   Denita Lung, MD  potassium chloride (K-DUR) 10 MEQ tablet Take 2 tablets (20 mEq total) by mouth daily. 05/31/16 06/02/16  Eugenie Filler, MD    Family History Family History  Problem Relation Age of Onset  . Kidney cancer Mother     Social History Social History  Substance Use Topics  . Smoking status: Former Smoker    Packs/day: 1.00    Years: 40.00    Types: Cigarettes    Quit date: 08/24/1985  . Smokeless tobacco: Never Used  . Alcohol use No     Allergies   Patient has no known allergies.   Review of Systems Review of Systems  Unable to perform ROS: Dementia     Physical Exam Updated Vital Signs BP 99/66   Pulse 96   Temp 101 F (38.3 C) (Rectal)   Resp 23   Ht '6\' 5"'$  (1.956 m)   Wt 90.7 kg   SpO2 94%   BMI 23.72 kg/m   Physical Exam  Constitutional: He appears ill. No distress.  Chronically ill appearing. HOH.  HENT:  Head: Normocephalic and atraumatic.  Eyes: Conjunctivae are normal. Pupils are equal, round, and reactive to light. Right eye exhibits no discharge. Left eye exhibits no discharge. No scleral icterus.  Neck: Normal range of motion.  Cardiovascular:  Regular rhythm.  Tachycardia present.  Exam reveals no gallop and no friction rub.   No murmur heard. Pulmonary/Chest: No accessory muscle usage. Tachypnea noted. No respiratory distress. He has decreased breath sounds. He has no wheezes. He has rhonchi. He has no rales. He exhibits no tenderness.  Able to talk but hard of hearing. Scattered rhonchi. Decreased breath sounds on right side. Occasional thick sounding cough  Abdominal: Soft. Bowel sounds are normal. He exhibits no distension and no mass. There is no tenderness. There is no rebound and no guarding. No hernia.  Neurological: He is alert.  Skin: Skin is warm and dry.  Psychiatric: He has a normal mood and affect. His behavior is normal.  Nursing note and vitals reviewed.    ED Treatments / Results  Labs (all labs ordered are listed, but only abnormal results are displayed) Labs Reviewed  COMPREHENSIVE METABOLIC PANEL - Abnormal;  Notable for the following:       Result Value   Potassium 3.2 (*)    Glucose, Bld 154 (*)    BUN 28 (*)    Calcium 8.2 (*)    Albumin 2.2 (*)    GFR calc non Af Amer 55 (*)    All other components within normal limits  CBC WITH DIFFERENTIAL/PLATELET - Abnormal; Notable for the following:    WBC 19.3 (*)    RBC 3.46 (*)    Hemoglobin 10.9 (*)    HCT 34.4 (*)    Platelets 584 (*)    Neutro Abs 14.2 (*)    Monocytes Absolute 2.3 (*)    All other components within normal limits  URINALYSIS, ROUTINE W REFLEX MICROSCOPIC (NOT AT [facility]) - Abnormal; Notable for the following:    Color, Urine AMBER (*)    APPearance TURBID (*)    Hgb urine dipstick LARGE (*)    Bilirubin Urine MODERATE (*)    Protein, ur 30 (*)    All other components within normal limits  URINE MICROSCOPIC-ADD ON - Abnormal; Notable for the following:    Squamous Epithelial / LPF 0-5 (*)    Bacteria, UA FEW (*)    Casts HYALINE CASTS (*)    All other components within normal limits  I-STAT CG4 LACTIC ACID, ED - Abnormal; Notable  for the following:    Lactic Acid, Venous 2.18 (*)    All other components within normal limits  I-STAT CG4 LACTIC ACID, ED - Abnormal; Notable for the following:    Lactic Acid, Venous 2.33 (*)    All other components within normal limits  CULTURE, BLOOD (ROUTINE X 2)  CULTURE, BLOOD (ROUTINE X 2)  URINE CULTURE    EKG  EKG Interpretation None       Radiology Dg Chest Port 1 View  Result Date: 06/11/2016 CLINICAL DATA:  Diaphoresis, fever, shortness of breath and cough. Sepsis protocol. History of pneumonia, RIGHT lung cancer. EXAM: PORTABLE CHEST 1 VIEW COMPARISON:  Chest radiograph June 04, 2016 and PET-CT May 03, 2016 FINDINGS: Cardiomediastinal silhouette is normal considering rotation to the LEFT. Mild chronic interstitial changes increased lung volume with small to moderate RIGHT pleural effusion extending to the minor fissure. Patchy RIGHT lower lobe airspace opacity. RIGHT hilar masslike density. Apical pleural thickening. Increased lung volumes. No pneumothorax. Soft tissue planes and included osseous structure nonsuspicious, osteopenia. IMPRESSION: Small to moderate RIGHT pleural effusion with RIGHT lower lobe atelectasis/ consolidation. Prominent RIGHT hilum most representative of lymphadenopathy considering prior chest CT. COPD. Electronically Signed   By: Elon Alas M.D.   On: 06/11/2016 16:13    Procedures Procedures (including critical care time)  Medications Ordered in ED Medications  vancomycin (VANCOCIN) IVPB 1000 mg/200 mL premix (not administered)  piperacillin-tazobactam (ZOSYN) IVPB 3.375 g (not administered)  sodium chloride 0.9 % bolus 1,000 mL (0 mLs Intravenous Stopped 06/11/16 1651)    And  sodium chloride 0.9 % bolus 1,000 mL (0 mLs Intravenous Stopped 06/11/16 1735)    And  sodium chloride 0.9 % bolus 1,000 mL (0 mLs Intravenous Stopped 06/11/16 1807)  piperacillin-tazobactam (ZOSYN) IVPB 3.375 g (0 g Intravenous Stopped 06/11/16 1659)    vancomycin (VANCOCIN) IVPB 1000 mg/200 mL premix (0 mg Intravenous Stopped 06/11/16 1704)  vancomycin (VANCOCIN) IVPB 750 mg/150 ml premix (750 mg Intravenous Given 06/11/16 1704)     Initial Impression / Assessment and Plan / ED Course  I have reviewed the triage vital signs and  the nursing notes.  Pertinent labs & imaging results that were available during my care of the patient were reviewed by me and considered in my medical decision making (see chart for details).  Clinical Course    82 year old male presents with sepsis due to RLL PNA with ARF. He was was hypoxic on RA to 80% initially requiring 15L on NRB. He was tachycardic in the 140s and febrile with rectal temp of 101. Code sepsis initiated. Broad spectrum antibiotics given due to recent treatment of PNA. 3L fluids given. CXR remarkable for RLL PNA. CBC remarkable for leukocytosis of 19.3 and mild anemia. CMP remarkable for mild hypokalemia, glucose 154. SCr is 1.19. UA does not appear infected. Lactic acid is 2.18 and repeat is 2.33.   After therapies were initiated vital signs improved and patient reports some symptomatic improvement. Spoke with Dr. Roel Cluck with hospitalist service who will admit patient to SDU. Appreciate assistance.   Final Clinical Impressions(s) / ED Diagnoses   Final diagnoses:  Sepsis due to pneumonia [facility])  Acute respiratory failure with hypoxia [facility])    New Prescriptions New Prescriptions   No medications on file     Recardo Evangelist, PA-C 06/12/16 [place], [place] 06/13/16 [phone]

## 2016-06-11 NOTE — ED Triage Notes (Signed)
Per [facility] personnel, Pt was returning from radiology appointment at this facility when PTAR discovered pt was hypotensive and tachycardic. Pt was recently d/c from this facility for cough/sepsis. Pt DNR was recently reversed.

## 2016-06-11 NOTE — Progress Notes (Signed)
Pharmacy Antibiotic Note  EURAL HOLZSCHUH is a 82 y.o. male admitted on 06/11/2016 with sepsis.  Pharmacy has been consulted for vancomycin and Zosyn dosing.  Plan:  Vancomycin 1000 + 750 mg IV now, then 1000 mg IV q12 hr; goal trough 15-20 mcg/mL  Measure vancomycin trough levels at steady state as indicated  Zosyn 3.375 g IV given once over 30 minutes, then every 8 hrs by 4-hr infusion Follow clinical course, renal function, culture results as available Follow for de-escalation of antibiotics and LOT     Temp (24hrs), Avg:98.4 F (36.9 C), Min:98.4 F (36.9 C), Max:98.4 F (36.9 C)   Recent Labs Lab 06/05/16 0616 06/06/16 0540  WBC  --  11.8*  CREATININE 1.36* 1.04    Estimated Creatinine Clearance: 66.6 mL/min (by C-G formula based on SCr of 1.04 mg/dL).    No Known Allergies  Antimicrobials this admission: Vancomycin 12/1 >>  Zosyn 12/1 >>   Dose adjustments this admission: ---  Microbiology results: Recent UCx 11/16 with > 100k pansens E coli None ordered yet  Thank you for allowing pharmacy to be a part of this patient's care.  Reuel Boom, PharmD, BCPS Pager: [phone] 06/11/2016, 3:45 PM

## 2016-06-12 ENCOUNTER — Other Ambulatory Visit: Payer: Self-pay

## 2016-06-12 ENCOUNTER — Inpatient Hospital Stay (HOSPITAL_COMMUNITY): Payer: Commercial Managed Care - HMO

## 2016-06-12 DIAGNOSIS — R6510 Systemic inflammatory response syndrome (SIRS) of non-infectious origin without acute organ dysfunction: Secondary | ICD-10-CM

## 2016-06-12 LAB — URINE CULTURE: Culture: <10000 — AB

## 2016-06-12 LAB — COMPREHENSIVE METABOLIC PANEL
ALBUMIN: 1.8 g/dL — AB (ref 3.5–5.0)
ALT: 19 U/L (ref 17–63)
ANION GAP: 12 (ref 5–15)
AST: 17 U/L (ref 15–41)
Alkaline Phosphatase: 73 U/L (ref 38–126)
BILIRUBIN TOTAL: 0.9 mg/dL (ref 0.3–1.2)
BUN: 21 mg/dL — ABNORMAL HIGH (ref 6–20)
CO2: 21 mmol/L — AB (ref 22–32)
Calcium: 7.3 mg/dL — ABNORMAL LOW (ref 8.9–10.3)
Chloride: 112 mmol/L — ABNORMAL HIGH (ref 101–111)
Creatinine, Ser: 0.96 mg/dL (ref 0.61–1.24)
GFR calc Af Amer: >60 mL/min (ref >60)
GFR calc non Af Amer: >60 mL/min (ref >60)
GLUCOSE: 135 mg/dL — AB (ref 65–99)
POTASSIUM: 3 mmol/L — AB (ref 3.5–5.1)
SODIUM: 145 mmol/L (ref 135–145)
TOTAL PROTEIN: 5.6 g/dL — AB (ref 6.5–8.1)

## 2016-06-12 LAB — CBC
HEMATOCRIT: 31 % — AB (ref 39.0–52.0)
HEMOGLOBIN: 9.8 g/dL — AB (ref 13.0–17.0)
MCH: 31.6 pg (ref 26.0–34.0)
MCHC: 31.6 g/dL (ref 30.0–36.0)
MCV: 100 fL (ref 78.0–100.0)
Platelets: 513 10*3/uL — ABNORMAL HIGH (ref 150–400)
RBC: 3.1 MIL/uL — ABNORMAL LOW (ref 4.22–5.81)
RDW: 15.1 % (ref 11.5–15.5)
WBC: 17.3 10*3/uL — ABNORMAL HIGH (ref 4.0–10.5)

## 2016-06-12 LAB — MAGNESIUM: MAGNESIUM: 1.4 mg/dL — AB (ref 1.7–2.4)

## 2016-06-12 LAB — LACTIC ACID, PLASMA
Lactic Acid, Venous: 2.7 mmol/L (ref 0.5–1.9)
Lactic Acid, Venous: 1.4 mmol/L (ref 0.5–1.9)

## 2016-06-12 LAB — PREALBUMIN: Prealbumin: 6 mg/dL — ABNORMAL LOW (ref 18–38)

## 2016-06-12 LAB — PHOSPHORUS: PHOSPHORUS: 3.2 mg/dL (ref 2.5–4.6)

## 2016-06-12 LAB — C DIFFICILE QUICK SCREEN W PCR REFLEX
C DIFFICILE (CDIFF) INTERP: NOT DETECTED
C DIFFICILE (CDIFF) TOXIN: NEGATIVE
C DIFFICLE (CDIFF) ANTIGEN: NEGATIVE

## 2016-06-12 LAB — TSH: TSH: 0.799 u[IU]/mL (ref 0.350–4.500)

## 2016-06-12 LAB — PROCALCITONIN: Procalcitonin: 0.42 ng/mL

## 2016-06-12 LAB — STREP PNEUMONIAE URINARY ANTIGEN: Strep Pneumo Urinary Antigen: NEGATIVE

## 2016-06-12 MED ORDER — HYDROCODONE-ACETAMINOPHEN 5-325 MG PO TABS
1.0000 | ORAL_TABLET | Freq: Four times a day (QID) | ORAL | Status: DC | PRN
  Administered 2016-06-12: 1 via ORAL
  Filled 2016-06-12: qty 1

## 2016-06-12 MED ORDER — DIGOXIN 0.25 MG/ML IJ SOLN
0.2500 mg | Freq: Once | INTRAMUSCULAR | Status: AC
  Administered 2016-06-12: 0.25 mg via INTRAVENOUS
  Filled 2016-06-12: qty 1

## 2016-06-12 MED ORDER — SODIUM CHLORIDE 0.9 % IV BOLUS (SEPSIS)
500.0000 mL | Freq: Once | INTRAVENOUS | Status: AC
  Administered 2016-06-12: 500 mL via INTRAVENOUS

## 2016-06-12 MED ORDER — SODIUM CHLORIDE 0.9 % IV SOLN
Freq: Once | INTRAVENOUS | Status: AC
  Administered 2016-06-12: 21:00:00 via INTRAVENOUS

## 2016-06-12 MED ORDER — DILTIAZEM HCL 25 MG/5ML IV SOLN
10.0000 mg | Freq: Once | INTRAVENOUS | Status: DC
  Filled 2016-06-12: qty 5

## 2016-06-12 MED ORDER — METOPROLOL TARTRATE 5 MG/5ML IV SOLN
2.5000 mg | Freq: Once | INTRAVENOUS | Status: AC
  Administered 2016-06-12: 2.5 mg via INTRAVENOUS
  Filled 2016-06-12: qty 5

## 2016-06-12 MED ORDER — DILTIAZEM HCL 30 MG PO TABS
30.0000 mg | ORAL_TABLET | Freq: Three times a day (TID) | ORAL | Status: DC
  Administered 2016-06-12 – 2016-06-14 (×4): 30 mg via ORAL
  Filled 2016-06-12 (×4): qty 1

## 2016-06-12 MED ORDER — MAGNESIUM SULFATE 2 GM/50ML IV SOLN
2.0000 g | Freq: Once | INTRAVENOUS | Status: AC
  Administered 2016-06-12: 2 g via INTRAVENOUS
  Filled 2016-06-12: qty 50

## 2016-06-12 MED ORDER — DILTIAZEM HCL 25 MG/5ML IV SOLN
10.0000 mg | Freq: Once | INTRAVENOUS | Status: AC
  Administered 2016-06-12: 10 mg via INTRAVENOUS
  Filled 2016-06-12: qty 5

## 2016-06-12 MED ORDER — IPRATROPIUM BROMIDE 0.02 % IN SOLN
0.5000 mg | Freq: Three times a day (TID) | RESPIRATORY_TRACT | Status: DC
  Administered 2016-06-12 (×2): 0.5 mg via RESPIRATORY_TRACT
  Filled 2016-06-12 (×2): qty 2.5

## 2016-06-12 MED ORDER — POTASSIUM CHLORIDE IN NACL 20-0.9 MEQ/L-% IV SOLN
INTRAVENOUS | Status: DC
  Administered 2016-06-12 – 2016-06-13 (×2): via INTRAVENOUS
  Filled 2016-06-12 (×2): qty 1000

## 2016-06-12 MED ORDER — LEVALBUTEROL HCL 0.63 MG/3ML IN NEBU: 0.6300 mg | INHALATION_SOLUTION | Freq: Four times a day (QID) | RESPIRATORY_TRACT | Status: DC | PRN

## 2016-06-12 MED ORDER — IPRATROPIUM BROMIDE 0.02 % IN SOLN: 0.5000 mg | Freq: Four times a day (QID) | RESPIRATORY_TRACT | Status: DC | PRN

## 2016-06-12 MED ORDER — SODIUM CHLORIDE 0.9 % IV SOLN
Freq: Once | INTRAVENOUS | Status: AC
  Administered 2016-06-13: via INTRAVENOUS

## 2016-06-12 MED ORDER — LEVALBUTEROL HCL 1.25 MG/0.5ML IN NEBU
1.2500 mg | INHALATION_SOLUTION | Freq: Three times a day (TID) | RESPIRATORY_TRACT | Status: DC
  Administered 2016-06-12 (×2): 1.25 mg via RESPIRATORY_TRACT
  Filled 2016-06-12 (×3): qty 0.5

## 2016-06-12 MED ORDER — POTASSIUM CHLORIDE CRYS ER 20 MEQ PO TBCR
40.0000 meq | EXTENDED_RELEASE_TABLET | ORAL | Status: AC
  Administered 2016-06-12 (×2): 40 meq via ORAL
  Filled 2016-06-12 (×2): qty 2

## 2016-06-12 MED ORDER — METOPROLOL TARTRATE 5 MG/5ML IV SOLN
2.5000 mg | Freq: Four times a day (QID) | INTRAVENOUS | Status: DC
  Administered 2016-06-12: 2.5 mg via INTRAVENOUS
  Filled 2016-06-12: qty 5

## 2016-06-12 MED ORDER — DILTIAZEM HCL 100 MG IV SOLR: 10.0000 mg/h | Freq: Once | INTRAVENOUS | Status: DC

## 2016-06-12 NOTE — Progress Notes (Signed)
Patient continues to sustain HR 150s to 160s. Dr Wynelle Cleveland notified via 2 telephone pages. New orders obtained with each telephone page/conversation (see MAR). This RN remaining at bedside monitoring patient closely. Patient's wife at bedside providing emotional support to patient.

## 2016-06-12 NOTE — Progress Notes (Signed)
CRITICAL VALUE ALERT  Critical value received: Lactic Acid 2.7  Date of notification:  06/12/16  Time of notification:  0217  Critical value read back: yes  Nurse who received alert:  Harlene Ramus  MD notified (1st page): Donnal Debar  Time of first page: 0218  MD notified (2nd page):  Time of second page:  Responding MD:  Donnal Debar  Time MD responded:  [phone]

## 2016-06-12 NOTE — Progress Notes (Signed)
Pt blood sugar 166.

## 2016-06-12 NOTE — Progress Notes (Signed)
PT Cancellation Note  Patient Details Name: Alexander Landry MRN: [id number] DOB: 1932-10-10   Cancelled Treatment:     PT eval attempted but deferred this am.  RN advises pt incontinent of bowel and about to be cleaned up.  Will follow.   Candas Deemer 06/12/2016, 5:24 PM

## 2016-06-12 NOTE — Progress Notes (Signed)
PROGRESS NOTE    Alexander Landry  OQH:[id number] DOB: [birth year] DOA: 06/11/2016  PCP: [name], MD   Brief Narrative:  82 y.o. male with recently diagnosed metastatic bronchogenic adenocarcinoma iwith R hilar mass on 2 L O2 who has been undergoing palliative radiation and has been admitted to the hospital twice since mid Nov for AKi and fevers thought to be due to underlying cancer and a UTI. He has not been eating well and has stopped walking since his first admission. He has had a foley since his last admission. Family mentions that he has been having loose stools and is incontinent He was discharged on 11/28 to [facility] and returns for BP of 82/ 53, temp 101, shortness of breath. Found to have HR 148, RR 28, lactic acid 2.7.  WBC count has been elevated and is unchanged.   Subjective: Sleeping, mumbles- per family he is confused at baseline but able to recognize them.   Assessment & Plan:   Principal Problem:   SIRS (systemic inflammatory response syndrome) - ? Pneumonia- chest imaging unchanged regarding a RLL infiltrate and small effusion which has been present al least 2 wks - cont Vanc/ Zosyn- monitor for resolution of fevers and leukocytosis - has diarrhea- c diff negative- f/u GI pathogen panel- abdomen non-tender- last CT last week was negative for abdominal pathology - UA surprisingly negative despite foley cath   Active Problems:    Adenocarcinoma of right lung, stage 4   - RLL nodule, LAD, osseos mets noted on PET in 10/23 - mass in pancreatic head noted on CT on 11/24 - palliative radiation- 2 treatments left- has helped his right sided chest and shoulder pain - will ask Dr Julien Nordmann to discuss plan/ prognosis with them on Monday- consider hospice home due to extremely poor functional status- barely eating, gets into chair with 2 person assist  Lethargy - ? Fatigue related to poor nutrition + cancer - limit narcotics- stop remeron    Protein-calorie  malnutrition, severe - from what family tells me, I feel his nutrition is too poor to allow him to recuperate from current state- follow closely- this will determine if he can go back to [facility] or will go to hospice home    Hypokalemia/ hypomag - replace  Severe dehydration - cont IVF  Lactic acidosis - improved to normal    Diabetes mellitus type 2 with complications - T4S 6.8 in 05/25/16- low dose SSI  DVT prophylaxis: Lovenox Code Status: DNR Family Communication: wife and sons Disposition Plan:  Consultants:    Procedures:    Antimicrobials:  Anti-infectives    Start     Dose/Rate Route Frequency Ordered Stop   06/12/16 0600  vancomycin (VANCOCIN) IVPB 1000 mg/200 mL premix     1,000 mg 200 mL/hr over 60 Minutes Intravenous Every 12 hours 06/11/16 1626     06/11/16 2200  piperacillin-tazobactam (ZOSYN) IVPB 3.375 g     3.375 g 12.5 mL/hr over 240 Minutes Intravenous Every 8 hours 06/11/16 1626     06/11/16 2200  vancomycin (VANCOCIN) 125 MG capsule 125 mg  Status:  Discontinued     125 mg Oral 4 times daily 06/11/16 2131 06/11/16 2135   06/11/16 2200  vancomycin (VANCOCIN) 50 mg/mL oral solution 125 mg     125 mg Oral Every 6 hours 06/11/16 2137 06/20/16 0559   06/11/16 1600  vancomycin (VANCOCIN) IVPB 750 mg/150 ml premix     750 mg 150 mL/hr over 60 Minutes Intravenous  Once 06/11/16 1521 06/11/16 1804   06/11/16 1515  piperacillin-tazobactam (ZOSYN) IVPB 3.375 g     3.375 g 100 mL/hr over 30 Minutes Intravenous  Once 06/11/16 1510 06/11/16 1659   06/11/16 1515  vancomycin (VANCOCIN) IVPB 1000 mg/200 mL premix     1,000 mg 200 mL/hr over 60 Minutes Intravenous  Once 06/11/16 1510 06/11/16 1704       Objective: Vitals:   06/11/16 2128 06/12/16 0612 06/12/16 0843 06/12/16 1318  BP: (!) 114/40 100/78  (!) 109/50  Pulse: (!) 109 (!) 120  (!) 116  Resp: (!) 28 (!) 24  (!) 22  Temp: 98 F (36.7 C) 98.6 F (37 C)  98 F (36.7 C)  TempSrc: Oral Oral  Axillary   SpO2: 99% 94% 95% 94%  Weight: 91 kg (200 lb 9.9 oz)     Height: '6\' 4"'$  (1.93 m)       Intake/Output Summary (Last 24 hours) at 06/12/16 1417 Last data filed at 06/12/16 0719  Gross per 24 hour  Intake          5117.08 ml  Output             1000 ml  Net          4117.08 ml   Filed Weights   06/11/16 1537 06/11/16 2128  Weight: 90.7 kg (200 lb) 91 kg (200 lb 9.9 oz)    Examination: General exam: sleepy- murmurs when trying to awaken HEENT: PERRLA, oral mucosa moist, no sclera icterus or thrush Respiratory system: Clear to auscultation. Respiratory rate in mid 20s. On 6 L O2- breathing through mouth Cardiovascular system: S1 & S2 heard, RRR.  No murmurs  Gastrointestinal system: Abdomen soft, non-tender, nondistended. Normal bowel sound. No organomegaly- laying in brown watery stool Central nervous system: sleepy- No focal neurological deficits. Extremities: No cyanosis, clubbing or edema Skin: No rashes or ulcers Psychiatry:  Unable to assess- confused at baseline per family    Data Reviewed: I have personally reviewed following labs and imaging studies  CBC:  Recent Labs Lab 06/06/16 0540 06/11/16 1531 06/12/16 0519  WBC 11.8* 19.3* 17.3*  NEUTROABS  --  14.2*  --   HGB 10.0* 10.9* 9.8*  HCT 30.5* 34.4* 31.0*  MCV 97.8 99.4 100.0  PLT 429* 584* 412*   Basic Metabolic Panel:  Recent Labs Lab 06/06/16 0540 06/11/16 1531 06/12/16 0519  NA 145 141 145  K 3.3* 3.2* 3.0*  CL 109 103 112*  CO2 27 25 21*  GLUCOSE 146* 154* 135*  BUN 24* 28* 21*  CREATININE 1.04 1.19 0.96  CALCIUM 7.7* 8.2* 7.3*  MG  --   --  1.4*  PHOS  --   --  3.2   GFR: Estimated Creatinine Clearance: 74.1 mL/min (by C-G formula based on SCr of 0.96 mg/dL). Liver Function Tests:  Recent Labs Lab 06/11/16 1531 06/12/16 0519  AST 17 17  ALT 23 19  ALKPHOS 88 73  BILITOT 1.1 0.9  PROT 6.7 5.6*  ALBUMIN 2.2* 1.8*   No results for input(s): LIPASE, AMYLASE in the last 168  hours. No results for input(s): AMMONIA in the last 168 hours. Coagulation Profile: No results for input(s): INR, PROTIME in the last 168 hours. Cardiac Enzymes: No results for input(s): CKTOTAL, CKMB, CKMBINDEX, TROPONINI in the last 168 hours. BNP (last 3 results) No results for input(s): PROBNP in the last 8760 hours. HbA1C: No results for input(s): HGBA1C in the last 72 hours. CBG:  Recent Labs Lab 06/07/16 1651 06/07/16 2237 06/08/16 0803 06/08/16 1216 06/11/16 2258  GLUCAP 127* 157* 130* 154* 138*   Lipid Profile: No results for input(s): CHOL, HDL, LDLCALC, TRIG, CHOLHDL, LDLDIRECT in the last 72 hours. Thyroid Function Tests:  Recent Labs  06/12/16 0112  TSH 0.799   Anemia Panel: No results for input(s): VITAMINB12, FOLATE, FERRITIN, TIBC, IRON, RETICCTPCT in the last 72 hours. Urine analysis:    Component Value Date/Time   COLORURINE AMBER (A) 06/11/2016 1621   APPEARANCEUR TURBID (A) 06/11/2016 1621   LABSPEC 1.028 06/11/2016 1621   PHURINE 5.0 06/11/2016 1621   GLUCOSEU NEGATIVE 06/11/2016 1621   HGBUR LARGE (A) 06/11/2016 1621   BILIRUBINUR MODERATE (A) 06/11/2016 1621   BILIRUBINUR neg 07/03/2014 1320   [place] 06/11/2016 1621   PROTEINUR 30 (A) 06/11/2016 1621   UROBILINOGEN negative 07/03/2014 1320   NITRITE NEGATIVE 06/11/2016 1621   LEUKOCYTESUR NEGATIVE 06/11/2016 1621   Sepsis Labs: '@LABRCNTIP'$ (procalcitonin:4,lacticidven:4) ) Recent Results (from the past 240 hour(s))  Culture, blood (x 2)     Status: None   Collection Time: 06/04/16  8:20 AM  Result Value Ref Range Status   Specimen Description BLOOD LEFT ARM  Final   Special Requests IN PEDIATRIC BOTTLE [place]  Final   Culture   Final    NO GROWTH 5 DAYS Performed at [facility]    Report Status 06/09/2016 FINAL  Final  Culture, blood (x 2)     Status: None   Collection Time: 06/04/16  8:20 AM  Result Value Ref Range Status   Specimen Description BLOOD LEFT HAND   Final   Special Requests IN PEDIATRIC BOTTLE 4CC  Final   Culture   Final    NO GROWTH 5 DAYS Performed at [facility]    Report Status 06/09/2016 FINAL  Final  Respiratory Panel by PCR     Status: None   Collection Time: 06/04/16  9:22 AM  Result Value Ref Range Status   Adenovirus NOT DETECTED NOT DETECTED Final   Coronavirus 229E NOT DETECTED NOT DETECTED Final   Coronavirus HKU1 NOT DETECTED NOT DETECTED Final   Coronavirus NL63 NOT DETECTED NOT DETECTED Final   Coronavirus OC43 NOT DETECTED NOT DETECTED Final   Metapneumovirus NOT DETECTED NOT DETECTED Final   Rhinovirus / Enterovirus NOT DETECTED NOT DETECTED Final   Influenza A NOT DETECTED NOT DETECTED Final   Influenza B NOT DETECTED NOT DETECTED Final   Parainfluenza Virus 1 NOT DETECTED NOT DETECTED Final   Parainfluenza Virus 2 NOT DETECTED NOT DETECTED Final   Parainfluenza Virus 3 NOT DETECTED NOT DETECTED Final   Parainfluenza Virus 4 NOT DETECTED NOT DETECTED Final   Respiratory Syncytial Virus NOT DETECTED NOT DETECTED Final   Bordetella pertussis NOT DETECTED NOT DETECTED Final   Chlamydophila pneumoniae NOT DETECTED NOT DETECTED Final   Mycoplasma pneumoniae NOT DETECTED NOT DETECTED Final    Comment: Performed at [facility]  Urine culture     Status: None   Collection Time: 06/04/16  9:52 AM  Result Value Ref Range Status   Specimen Description URINE, CLEAN CATCH  Final   Special Requests Immunocompromised  Final   Culture NO GROWTH Performed at [facility]   Final   Report Status 06/05/2016 FINAL  Final  Blood Culture (routine x 2)     Status: None (Preliminary result)   Collection Time: 06/11/16  3:32 PM  Result Value Ref Range Status   Specimen Description  Final    BLOOD RIGHT HAND Performed at [facility]    Special Requests IN PEDIATRIC BOTTLE 3 ML  Final   Culture PENDING  Incomplete   Report Status PENDING  Incomplete  C difficile quick scan w PCR reflex      Status: None   Collection Time: 06/12/16 11:35 AM  Result Value Ref Range Status   C Diff antigen NEGATIVE NEGATIVE Final   C Diff toxin NEGATIVE NEGATIVE Final   C Diff interpretation No C. difficile detected.  Final         Radiology Studies: Dg Chest Port 1 View  Result Date: 06/11/2016 CLINICAL DATA:  Diaphoresis, fever, shortness of breath and cough. Sepsis protocol. History of pneumonia, RIGHT lung cancer. EXAM: PORTABLE CHEST 1 VIEW COMPARISON:  Chest radiograph June 04, 2016 and PET-CT May 03, 2016 FINDINGS: Cardiomediastinal silhouette is normal considering rotation to the LEFT. Mild chronic interstitial changes increased lung volume with small to moderate RIGHT pleural effusion extending to the minor fissure. Patchy RIGHT lower lobe airspace opacity. RIGHT hilar masslike density. Apical pleural thickening. Increased lung volumes. No pneumothorax. Soft tissue planes and included osseous structure nonsuspicious, osteopenia. IMPRESSION: Small to moderate RIGHT pleural effusion with RIGHT lower lobe atelectasis/ consolidation. Prominent RIGHT hilum most representative of lymphadenopathy considering prior chest CT. COPD. Electronically Signed   By: Elon Alas M.D.   On: 06/11/2016 16:13      Scheduled Meds: . enoxaparin (LOVENOX) injection  40 mg Subcutaneous Q24H  . feeding supplement (ENSURE ENLIVE)  237 mL Oral BID BM  . guaiFENesin  600 mg Oral BID  . insulin aspart  0-5 Units Subcutaneous QHS  . insulin aspart  0-9 Units Subcutaneous TID WC  . ipratropium  0.5 mg Nebulization TID  . levalbuterol  1.25 mg Nebulization TID  . metoprolol  2.5 mg Intravenous Q6H  . mirtazapine  15 mg Oral QHS  . piperacillin-tazobactam (ZOSYN)  IV  3.375 g Intravenous Q8H  . sodium chloride flush  3 mL Intravenous Q12H  . vancomycin  125 mg Oral Q6H  . vancomycin  1,000 mg Intravenous Q12H   Continuous Infusions: . 0.9 % NaCl with KCl 20 mEq / L 75 mL/hr at 06/12/16 1300      LOS: 1 day    Time spent in minutes: 62    [place], MD [facility] Pager: [email or web address] Password [email or web address] 06/12/2016, 2:17 PM

## 2016-06-12 NOTE — Progress Notes (Signed)
Received call from monitor tech as this Probation officer is covering for primary RN.  Pt HR sustaining 150s, appears to be ST.  Pt sleeping, NAD.  All other VSS as charted.  Noted that pt received neb tx at 1440 and 2.'5mg'$  IV lopressor at 1241.  Dr Wynelle Cleveland made aware.  States she will change neb tx to PRN.  No other orders.  Will update Katharine Look, primary RN.

## 2016-06-12 NOTE — Progress Notes (Addendum)
Pt HR sustaining in mid 130's, temp 100, BP dropped, coughing after swallowing liquids, Alexander Landry, [place] notified. New orders received. Pt is now NPO. Chest xray ordered & done. 549m bolus of normal saline given. Tylenol suppository given. Will continue to monitor patient closely.   0008-Pt continued to have low BP's, another bolus given. Rapid response RN called. Total of 2L bolus given. All orders followed out.  [place]. [place] assessed patient. Family was called and made aware and discussed plan of care with this RN, rapid response RN, and Alexander Landry [place] All questions were answered. Pt resting comfortably. Will continue to monitor patient closely.

## 2016-06-13 DIAGNOSIS — J9601 Acute respiratory failure with hypoxia: Secondary | ICD-10-CM

## 2016-06-13 DIAGNOSIS — R6510 Systemic inflammatory response syndrome (SIRS) of non-infectious origin without acute organ dysfunction: Secondary | ICD-10-CM

## 2016-06-13 LAB — CBC
HEMATOCRIT: 31.5 % — AB (ref 39.0–52.0)
HEMOGLOBIN: 9.9 g/dL — AB (ref 13.0–17.0)
MCH: 31.6 pg (ref 26.0–34.0)
MCHC: 31.4 g/dL (ref 30.0–36.0)
MCV: 100.6 fL — ABNORMAL HIGH (ref 78.0–100.0)
Platelets: 495 10*3/uL — ABNORMAL HIGH (ref 150–400)
RBC: 3.13 MIL/uL — ABNORMAL LOW (ref 4.22–5.81)
RDW: 15.7 % — ABNORMAL HIGH (ref 11.5–15.5)
WBC: 15.3 10*3/uL — ABNORMAL HIGH (ref 4.0–10.5)

## 2016-06-13 LAB — BASIC METABOLIC PANEL
ANION GAP: 9 (ref 5–15)
BUN: 16 mg/dL (ref 6–20)
CO2: 18 mmol/L — ABNORMAL LOW (ref 22–32)
Calcium: 8.1 mg/dL — ABNORMAL LOW (ref 8.9–10.3)
Chloride: 117 mmol/L — ABNORMAL HIGH (ref 101–111)
Creatinine, Ser: 0.84 mg/dL (ref 0.61–1.24)
GFR calc Af Amer: >60 mL/min (ref >60)
GLUCOSE: 168 mg/dL — AB (ref 65–99)
POTASSIUM: 3.6 mmol/L (ref 3.5–5.1)
Sodium: 144 mmol/L (ref 135–145)

## 2016-06-13 LAB — MAGNESIUM: Magnesium: 2 mg/dL (ref 1.7–2.4)

## 2016-06-13 MED ORDER — ALBUMIN HUMAN 25 % IV SOLN
12.5000 g | Freq: Once | INTRAVENOUS | Status: AC
  Administered 2016-06-13: 12.5 g via INTRAVENOUS
  Filled 2016-06-13: qty 50

## 2016-06-13 MED ORDER — HYDROCORTISONE NA SUCCINATE PF 100 MG IJ SOLR
100.0000 mg | Freq: Once | INTRAMUSCULAR | Status: AC
  Administered 2016-06-13: 100 mg via INTRAVENOUS
  Filled 2016-06-13: qty 2

## 2016-06-13 MED ORDER — MORPHINE SULFATE (PF) 2 MG/ML IV SOLN
2.0000 mg | INTRAVENOUS | Status: DC | PRN
  Administered 2016-06-14: 2 mg via INTRAVENOUS
  Filled 2016-06-13: qty 1

## 2016-06-13 NOTE — Progress Notes (Signed)
Nutrition Brief Note  RD consulted for nutritional assessment. Pt's family making [place] decisions. Per palliative care note, comfort feeds only.   Wt Readings from Last 15 Encounters:  06/11/16 200 lb 9.9 oz (91 kg)  06/02/16 213 lb 3 oz (96.7 kg)  05/31/16 196 lb 10.4 oz (89.2 kg)  05/24/16 191 lb 8 oz (86.9 kg)  05/10/16 207 lb (93.9 kg)  04/23/16 207 lb (93.9 kg)  04/14/16 208 lb 9.6 oz (94.6 kg)  04/05/16 209 lb (94.8 kg)  03/25/16 212 lb (96.2 kg)  02/16/16 211 lb (95.7 kg)  02/11/16 210 lb 3.2 oz (95.3 kg)  10/23/15 211 lb 6.4 oz (95.9 kg)  06/23/15 216 lb 9.6 oz (98.2 kg)  11/01/14 222 lb 9.6 oz (101 kg)  07/03/14 226 lb (102.5 kg)    Body mass index is 24.42 kg/m. Patient meets criteria for normal based on current BMI.   Labs and medications reviewed.   No nutrition interventions warranted at this time. If nutrition issues arise, please consult RD.   Clayton Bibles, MS, RD, LDN Pager: [phone] After Hours Pager: [phone]

## 2016-06-13 NOTE — Consult Note (Signed)
Consultation Note Date: 06/13/2016   Patient Name: Alexander Landry  DOB: 11-20-1932  MRN: [id number]  Age / Sex: 82 y.o., male  PCP: Denita Lung, MD Referring Physician: Debbe Odea, MD  Reason for Consultation: Establishing goals of care  HPI/Patient Profile: 82 y.o. male  admitted on 06/11/2016   Clinical Assessment and Goals of Care:  82 y.o.male withrecently diagnosed metastatic bronchogenic adenocarcinoma iwith R hilar mass on 2 L O2 who has been undergoing palliative radiation and has been admitted to the hospital twice since mid Nov for AKI and fevers thought to be due to underlying cancer and a UTI. He was seen by Dr Domingo Cocking during the last hospitalization, DNR DNI was elected, patient was seen by oncology, was to undergo further work up to see if he would be a candidate for further therapies. Unfortunately, he has not been eating well and has stopped walking since his first admission. He has had a foley since his last admission. Family mentions that he has been having loose stools and is incontinent.He was discharged on 11/28 to [facility] and returned on 06-11-16, has been admitted since then for SIRS, possible PNA, lethargy, dehydration and ongoing severe protein calorie malnutrition. Overnight, the patient declined further, with low BPs and high HR, concern for possible aspiration. Repeat palliative consult has been hence requested.   The patient is resting in bed, he is in no distress, is confused, is talking incessantly. He is able to be re directed. Wife and 2 sons and 1 grand son were present at the bedside. They recalled meeting Dr Domingo Cocking in last hospitalization, I re introduced palliative care as follows: Palliative medicine is specialized medical care for people living with serious illness. It focuses on providing relief from the symptoms and stress of a serious illness. The goal is to improve  quality of life for both the patient and the family.  Patient is complaining of his arms and legs feeling "tight", he has received lots of fluids overnight.   As per family request, further discussions held outside the patient's room.   Discussed about the patient's current condition, re addressed goals of care. Wife stated that she has been married to the patient for 60+ years, she has seen him decline significantly in the past few weeks. Family wishes to discuss further about hospice services, wife lives in [place], she is familiar with [facility].   Discussed about focusing on keeping [name] comfortable, discussed about comfort feeds, recognizing the risk of aspiration, PRN medications for pain or dyspnea etc. All questions answered to the best of my ability. See recommendations below, thank you for the consult.   HCPOA  wife   SUMMARY OF RECOMMENDATIONS    DNR DNI Hospice consult, family considering transfer to residential hospice, they will let [place] know on 06-14-16.  Comfort feeds, will start soft diet with aspiration precautions Continue current mode of care for now.  Add Morphine IV PRN.   Code Status/Advance Care Planning:  DNR    Symptom  Management:    see above   Palliative Prophylaxis:   Bowel Regimen   Psycho-social/Spiritual:   Desire for further Chaplaincy support:yes  Additional Recommendations: Education on Hospice  Prognosis:   < 2 weeks  Discharge Planning: Hospice facility      Primary Diagnoses: Present on Admission: . Adenocarcinoma of right lung, stage 4 ([place]) . Bone metastases ([place]) . Diabetes mellitus type 2 with complications ([place]) . (Resolved) HCAP (healthcare-associated pneumonia) . Hypotension . Protein-calorie malnutrition, severe . (Resolved) Sepsis ([place]) . Hypokalemia   I have reviewed the medical record, interviewed the patient and family, and examined the patient. The following aspects are  pertinent.  Past Medical History:  Diagnosis Date  . Adenocarcinoma of right lung, stage 4 ([place]) 05/24/2016  . Diabetes mellitus without complication ([place])   . GERD (gastroesophageal reflux disease)   . Hypertension   . Kyphosis (acquired) ([place])   . Lung cancer ([place]) 05/10/2016   right lower lobe lung=adenocarcinoma  . Medical history non-contributory    Social History   Social History  . Marital status: Married    Spouse name: N/A  . Number of children: N/A  . Years of education: N/A   Social History Main Topics  . Smoking status: Former Smoker    Packs/day: 1.00    Years: 40.00    Types: Cigarettes    Quit date: 08/24/1985  . Smokeless tobacco: Never Used  . Alcohol use No  . Drug use: No  . Sexual activity: Not Currently   Other Topics Concern  . None   Social History Narrative  . None   Family History  Problem Relation Age of Onset  . Kidney cancer Mother    Scheduled Meds: . diltiazem  30 mg Oral Q8H  . enoxaparin (LOVENOX) injection  40 mg Subcutaneous Q24H  . feeding supplement (ENSURE ENLIVE)  237 mL Oral BID BM  . guaiFENesin  600 mg Oral BID  . insulin aspart  0-5 Units Subcutaneous QHS  . insulin aspart  0-9 Units Subcutaneous TID WC  . piperacillin-tazobactam (ZOSYN)  IV  3.375 g Intravenous Q8H  . sodium chloride flush  3 mL Intravenous Q12H  . vancomycin  125 mg Oral Q6H  . vancomycin  1,000 mg Intravenous Q12H   Continuous Infusions: . 0.9 % NaCl with KCl 20 mEq / L 75 mL/hr at 06/13/16 0351   PRN Meds:.acetaminophen **OR** acetaminophen, HYDROcodone-acetaminophen, morphine injection, ondansetron **OR** ondansetron (ZOFRAN) IV Medications Prior to Admission:  Prior to Admission medications   Medication Sig Start Date End Date Taking? Authorizing Provider  amoxicillin-clavulanate (AUGMENTIN) 875-125 MG tablet Take 1 tablet by mouth 2 (two) times daily. 06/08/16 06/13/16 Yes Dron Tanna Furry, MD  atenolol (TENORMIN) 50 MG tablet Take 1  tablet (50 mg total) by mouth 2 (two) times daily. Patient taking differently: Take 50 mg by mouth daily.  04/12/16  Yes Denita Lung, MD  dronabinol (MARINOL) 2.5 MG capsule Take 2.5 mg by mouth 2 (two) times daily. 05/26/16  Yes Historical Provider, MD  feeding supplement, ENSURE ENLIVE, (ENSURE ENLIVE) LIQD Take 237 mLs by mouth 2 (two) times daily between meals. 06/01/16  Yes Eugenie Filler, MD  mirtazapine (REMERON) 15 MG tablet Take 1 tablet (15 mg total) by mouth at bedtime. 05/31/16  Yes Eugenie Filler, MD  Multiple Vitamins-Minerals (MULTIVITAMIN WITH MINERALS) tablet Take 1 tablet by mouth daily.   Yes Historical Provider, MD  omeprazole (PRILOSEC) 20 MG capsule TAKE 1 CAPSULE EVERY DAY Patient taking  differently: Take 20 mg by mouth daily.  09/16/15  Yes Denita Lung, MD  pioglitazone (ACTOS) 30 MG tablet 1 tablet daily Patient taking differently: Take 30 mg by mouth daily.  09/16/15  Yes Denita Lung, MD  Probiotic Product (ALIGN) 4 MG CAPS Take 1 capsule by mouth daily.   Yes Historical Provider, MD  simvastatin (ZOCOR) 20 MG tablet Take 1 tablet (20 mg total) by mouth daily at 6 PM. 08/25/15  Yes Denita Lung, MD  VANCOMYCIN HCL PO Take 5 mLs by mouth 4 (four) times daily. For 10 Days. ABT Start Date 06/11/16 & End Date 06/19/16.   Yes Historical Provider, MD  acetaminophen (TYLENOL) 500 MG tablet Take 1 tablet (500 mg total) by mouth every 4 (four) hours as needed for fever, headache or mild pain. 05/31/16   Eugenie Filler, MD  amLODipine (NORVASC) 5 MG tablet Take 5 mg by mouth daily. 06/01/16   Historical Provider, MD  hyaluronate sodium (RADIAPLEXRX) GEL Apply 1 application topically 2 (two) times daily. Apply to b/l shoulders and sternum daily after radiation and bedtime 06/01/16   [place] Pedro, PA-C  HYDROcodone-acetaminophen [facility]) 7.5-325 MG tablet Take 1 tablet by mouth every 4 (four) hours as needed for moderate pain. 05/13/16   Denita Lung, MD  metFORMIN  (GLUCOPHAGE) 500 MG tablet Take 1 tablet (500 mg total) by mouth 2 (two) times daily. Patient not taking: Reported on 06/11/2016 04/05/16   Denita Lung, MD  potassium chloride (K-DUR) 10 MEQ tablet Take 2 tablets (20 mEq total) by mouth daily. 05/31/16 06/02/16  Eugenie Filler, MD   No Known Allergies Review of Systems + for pain, dyspnea confusion  Physical Exam Frail, weak Shallow clear breath sounds S1 S2 Abdomen soft Muscle wasting No edema Confused.   Vital Signs: BP 120/66 (BP Location: Left Arm)   Pulse (!) 142   Temp 97.6 F (36.4 C) (Oral)   Resp (!) 24   Ht '6\' 4"'$  (1.93 m)   Wt 91 kg (200 lb 9.9 oz)   SpO2 95%   BMI 24.42 kg/m  Pain Assessment: No/denies pain   Pain Score: 0-No pain   SpO2: SpO2: 95 % O2 Device:SpO2: 95 % O2 Flow Rate: .O2 Flow Rate (L/min): 6 L/min  IO: Intake/output summary:  Intake/Output Summary (Last 24 hours) at 06/13/16 1023 Last data filed at 06/13/16 0600  Gross per 24 hour  Intake             4095 ml  Output              525 ml  Net             3570 ml    LBM: Last BM Date: 06/12/16 Baseline Weight: Weight: 90.7 kg (200 lb) Most recent weight: Weight: 91 kg (200 lb 9.9 oz)     Palliative Assessment/Data:   Flowsheet Rows   Flowsheet Row Most Recent Value  Intake Tab  Referral Department  Hospitalist  Unit at Time of Referral  Med/Surg Unit  Palliative Care Primary Diagnosis  Cancer  Palliative Care Type  Return patient Palliative Care  Reason for referral  Clarify Goals of Care, Counsel Regarding Hospice, End of [place], Psychosocial or Spiritual support  Date first seen by Palliative Care  06/13/16  Clinical Assessment  Palliative Performance Scale Score  30%  Pain Max last 24 hours  6  Pain Min Last 24 hours  5  Dyspnea Max  Last 24 Hours  7  Dyspnea Min Last 24 hours  4  Nausea Max Last 24 Hours  4  Nausea Min Last 24 Hours  3  Anxiety Max Last 24 Hours  3  Anxiety Min Last 24 Hours  2    Psychosocial & Spiritual Assessment  Palliative Care Outcomes  Patient/Family meeting held?  Yes  Who was at the meeting?  wife 2 sons and grand son   Palliative Care Outcomes  Clarified goals of care  Palliative Care follow-up planned  Yes, Facility      Time In:  9.30 Time Out:  10.30 Time Total:  60 min  Greater than 50%  of this time was spent counseling and coordinating care related to the above assessment and plan.  Signed by: Loistine Chance, MD  [phone]  Please contact Palliative Medicine Team phone at [phone] for questions and concerns.  For individual provider: See Shea Evans

## 2016-06-13 NOTE — Progress Notes (Signed)
PT Cancellation Note  Patient Details Name: Alexander Landry MRN: [id number] DOB: Oct 15, 1932   Cancelled Treatment:     Chart reviewed and entered to talk with family and patient. Pt sleeping, family discussed how they are going to have palliative meeting tomorrow, and he was tearful. I stated how we can still try and/or work with goals if they have wishes, however at this time they want to hold and wait to see what is discussed tomorrow.      Clide Dales 06/13/2016, 12:22 PM  Clide Dales, PT Pager: [phone] 06/13/2016

## 2016-06-13 NOTE — Significant Event (Signed)
Rapid Response Event Note  Overview: Time Called: 0008 Event Type: Hypotension  Initial Focused Assessment: Katie (Bedside RN) called to advise me of [name] being transferred to SDU.  I was presently wrapping up another rapid response call, when done I came to assess the situation.  When I arrived Dr. Eulas Post and Kandis Fantasia were at the bedside assessing him.  I was informed by Joellen Jersey that his BP had been low and continues to be low and BP had been being checked and rechecked frequently with IV boluses and that his acuity had risen to the point that his level of care needed to be changed.  Once the MD and PA completed their assessment I connected Mr. Tuch to the RRT monitor. EKG showed A-fib rate varied between 110 to 140 with average being 120s, SBP 107, Sats 96%.  Patient is alert, and responds appropriately to some questions but orientation is in question.  1L-NS IV bolus infusing with approximately 500 already infused.   Initially, pt was going to transfer to SDU, however, now BP responding to IV bolus.  Sofia,PA had a brief discussion with patient's wife by phone to address if patient would want pressors or how aggressive treatment would go.  At that point it was agreed that patient for the time being would stay in room 1427.  Family came in, another discussion took place with Sofia,PA and family regarding treatment path and goals of care.  Family had requested that I come in and answer some of their questions and give information.  Per family request a separate discussion took place with family and I.  I had advised them on what had taken place and that we understand per the DNR order that we wouldn't do CPR or place him on a ventilator, but that isn't were we are at, that we are sort of in a gray zone before that.  After further discussion, patient's wife and two sons agreed not to seek higher level of care treatment at this time.  They want all other treatment to stay the same.  Not comfort  care at this point.  Do not want vasopressors or anything else to artificially increase blood pressure.  Once this agreement was reached by his family, I paged Sofia,PA and advised her of such.  No new orders at this time.  Interventions: MD and PA at bedside, ordered IV bolus, solumedrol, and albumin  Plan of Care (if not transferred): See note above  Event Summary: Name of Physician Notified: Dr. Eulas Post and Sofia,PA at bedside  Outcome: Other (Comment), Stayed in room and stabalized (family debating about how agressive to be with treatment, presently BP stable)  Event End Time: [place] ICU/SD Care Coordinator / Rapid Response Nurse

## 2016-06-13 NOTE — Progress Notes (Addendum)
PROGRESS NOTE    Alexander Landry  LFY:[id number] DOB: [birth year] DOA: 06/11/2016  PCP: [name], MD   Brief Narrative:  82 y.o. male with recently diagnosed metastatic bronchogenic adenocarcinoma iwith R hilar mass on 2 L O2 who has been undergoing palliative radiation and has been admitted to the hospital twice since mid Nov for AKi and fevers thought to be due to underlying cancer and a UTI. He has not been eating well and has stopped walking since his first admission. He has had a foley since his last admission. Family mentions that he has been having loose stools and is incontinent He was discharged on 11/28 to [facility] and returns for BP of 82/ 53, temp 101, shortness of breath. Found to have HR 148, RR 28, lactic acid 2.7.  WBC count has been elevated and is unchanged.   Subjective: Sleeping   Assessment & Plan:  Significant decline overnight- developed SVT yesterday which has not resolved with Metoprolol, Cardizem and Dig. HR still 140-160s and irregular. Subsequently developed low BP and required multiple fluid boluses and a dose of Albumin. CXR repeated now showing b/l pleural effusions. Discussed hospice with family today. They feel that a hospice home is appropriate for him and will consider d/c tomorrow if a bed is available. Palliative care discussion with Dr Rowe Pavy has taken place today. Allowing comfort feeds and PRN Morphine in case he aspirates.   Principal Problem:   SIRS (systemic inflammatory response syndrome) - ? Pneumonia- chest imaging unchanged regarding a RLL infiltrate and small effusion which has been present al least 2 wks - cont Vanc/ Zosyn- monitor for resolution of fevers and leukocytosis- fever and WBC count improving - has diarrhea- c diff negative- f/u GI pathogen panel- abdomen non-tender- last CT last week was negative for abdominal pathology - UA surprisingly negative despite foley cath   Active Problems:    Adenocarcinoma of right lung,  stage 4   - RLL nodule, LAD, osseos mets noted on PET in 10/23 - mass in pancreatic head noted on CT on 11/24 - palliative radiation- 2 treatments left- has helped his right sided chest and shoulder pain - will ask Dr Julien Nordmann to discuss plan/ prognosis with them on Monday- consider hospice home due to extremely poor functional status- barely eating, gets into chair with 2 person assist  Lethargy - ? Fatigue related to poor nutrition + cancer - limit narcotics- stop remeron    Protein-calorie malnutrition, severe - from what family tells me, I feel his nutrition is too poor to allow him to recuperate from current state-  hospice home is best for him a this point    Hypokalemia/ hypomag - replace  Severe dehydration - unfortunately IVF has led to anasarca due to his poor nutritional status - d/c IVF  Lactic acidosis - improved to normal but now elevated again due to hypotension and tachycardia    Diabetes mellitus type 2 with complications - E5I 6.8 in 05/25/16-   DVT prophylaxis: Lovenox Code Status: DNR Family Communication: wife and sons Disposition Plan:  Consultants:    Procedures:    Antimicrobials:  Anti-infectives    Start     Dose/Rate Route Frequency Ordered Stop   06/12/16 0600  vancomycin (VANCOCIN) IVPB 1000 mg/200 mL premix     1,000 mg 200 mL/hr over 60 Minutes Intravenous Every 12 hours 06/11/16 1626     06/11/16 2200  piperacillin-tazobactam (ZOSYN) IVPB 3.375 g     3.375 g 12.5 mL/hr over 240  Minutes Intravenous Every 8 hours 06/11/16 1626     06/11/16 2200  vancomycin (VANCOCIN) 125 MG capsule 125 mg  Status:  Discontinued     125 mg Oral 4 times daily 06/11/16 2131 06/11/16 2135   06/11/16 2200  vancomycin (VANCOCIN) 50 mg/mL oral solution 125 mg     125 mg Oral Every 6 hours 06/11/16 2137 06/20/16 0559   06/11/16 1600  vancomycin (VANCOCIN) IVPB 750 mg/150 ml premix     750 mg 150 mL/hr over 60 Minutes Intravenous  Once 06/11/16 1521 06/11/16 1804    06/11/16 1515  piperacillin-tazobactam (ZOSYN) IVPB 3.375 g     3.375 g 100 mL/hr over 30 Minutes Intravenous  Once 06/11/16 1510 06/11/16 1659   06/11/16 1515  vancomycin (VANCOCIN) IVPB 1000 mg/200 mL premix     1,000 mg 200 mL/hr over 60 Minutes Intravenous  Once 06/11/16 1510 06/11/16 1704       Objective: Vitals:   06/13/16 0300 06/13/16 0357 06/13/16 0518 06/13/16 0648  BP: (!) 88/56 107/60 108/64 120/66  Pulse:   (!) 127 (!) 142  Resp:   (!) 22 (!) 24  Temp:   99 F (37.2 C) 97.6 F (36.4 C)  TempSrc:   Oral Oral  SpO2:   96% 95%  Weight:      Height:        Intake/Output Summary (Last 24 hours) at 06/13/16 1043 Last data filed at 06/13/16 0600  Gross per 24 hour  Intake             4095 ml  Output              525 ml  Net             3570 ml   Filed Weights   06/11/16 1537 06/11/16 2128  Weight: 90.7 kg (200 lb) 91 kg (200 lb 9.9 oz)    Examination: General exam: sleepy- murmurs when trying to awaken HEENT: PERRLA, oral mucosa moist, no sclera icterus or thrush Respiratory system: Clear to auscultation. Respiratory rate in mid 20s. On 6 L O2- breathing through mouth Cardiovascular system: S1 & S2 heard, IIRR.  No murmurs  Gastrointestinal system: Abdomen soft, non-tender, nondistended. Normal bowel sound. No organomegaly-  Central nervous system: sleepy- No focal neurological deficits. Extremities: No cyanosis, clubbing or edema Skin: No rashes or ulcers Psychiatry:  Unable to assess- confused at baseline per family    Data Reviewed: I have personally reviewed following labs and imaging studies  CBC:  Recent Labs Lab 06/11/16 1531 06/12/16 0519 06/13/16 0518  WBC 19.3* 17.3* 15.3*  NEUTROABS 14.2*  --   --   HGB 10.9* 9.8* 9.9*  HCT 34.4* 31.0* 31.5*  MCV 99.4 100.0 100.6*  PLT 584* 513* 992*   Basic Metabolic Panel:  Recent Labs Lab 06/11/16 1531 06/12/16 0519 06/13/16 0518  NA 141 145 144  K 3.2* 3.0* 3.6  CL 103 112* 117*  CO2  25 21* 18*  GLUCOSE 154* 135* 168*  BUN 28* 21* 16  CREATININE 1.19 0.96 0.84  CALCIUM 8.2* 7.3* 8.1*  MG  --  1.4* 2.0  PHOS  --  3.2  --    GFR: Estimated Creatinine Clearance: 84.7 mL/min (by C-G formula based on SCr of 0.84 mg/dL). Liver Function Tests:  Recent Labs Lab 06/11/16 1531 06/12/16 0519  AST 17 17  ALT 23 19  ALKPHOS 88 73  BILITOT 1.1 0.9  PROT 6.7 5.6*  ALBUMIN 2.2* 1.8*  No results for input(s): LIPASE, AMYLASE in the last 168 hours. No results for input(s): AMMONIA in the last 168 hours. Coagulation Profile: No results for input(s): INR, PROTIME in the last 168 hours. Cardiac Enzymes: No results for input(s): CKTOTAL, CKMB, CKMBINDEX, TROPONINI in the last 168 hours. BNP (last 3 results) No results for input(s): PROBNP in the last 8760 hours. HbA1C: No results for input(s): HGBA1C in the last 72 hours. CBG:  Recent Labs Lab 06/07/16 1651 06/07/16 2237 06/08/16 0803 06/08/16 1216 06/11/16 2258  GLUCAP 127* 157* 130* 154* 138*   Lipid Profile: No results for input(s): CHOL, HDL, LDLCALC, TRIG, CHOLHDL, LDLDIRECT in the last 72 hours. Thyroid Function Tests:  Recent Labs  06/12/16 0112  TSH 0.799   Anemia Panel: No results for input(s): VITAMINB12, FOLATE, FERRITIN, TIBC, IRON, RETICCTPCT in the last 72 hours. Urine analysis:    Component Value Date/Time   COLORURINE AMBER (A) 06/11/2016 1621   APPEARANCEUR TURBID (A) 06/11/2016 1621   LABSPEC 1.028 06/11/2016 1621   PHURINE 5.0 06/11/2016 1621   GLUCOSEU NEGATIVE 06/11/2016 1621   HGBUR LARGE (A) 06/11/2016 1621   BILIRUBINUR MODERATE (A) 06/11/2016 1621   BILIRUBINUR neg 07/03/2014 1320   [place] 06/11/2016 1621   PROTEINUR 30 (A) 06/11/2016 1621   UROBILINOGEN negative 07/03/2014 1320   NITRITE NEGATIVE 06/11/2016 1621   LEUKOCYTESUR NEGATIVE 06/11/2016 1621   Sepsis Labs: '@LABRCNTIP'$ (procalcitonin:4,lacticidven:4) ) Recent Results (from the past 240 hour(s))    Culture, blood (x 2)     Status: None   Collection Time: 06/04/16  8:20 AM  Result Value Ref Range Status   Specimen Description BLOOD LEFT ARM  Final   Special Requests IN PEDIATRIC BOTTLE [place]  Final   Culture   Final    NO GROWTH 5 DAYS Performed at [facility]    Report Status 06/09/2016 FINAL  Final  Culture, blood (x 2)     Status: None   Collection Time: 06/04/16  8:20 AM  Result Value Ref Range Status   Specimen Description BLOOD LEFT HAND  Final   Special Requests IN PEDIATRIC BOTTLE 4CC  Final   Culture   Final    NO GROWTH 5 DAYS Performed at [facility]    Report Status 06/09/2016 FINAL  Final  Respiratory Panel by PCR     Status: None   Collection Time: 06/04/16  9:22 AM  Result Value Ref Range Status   Adenovirus NOT DETECTED NOT DETECTED Final   Coronavirus 229E NOT DETECTED NOT DETECTED Final   Coronavirus HKU1 NOT DETECTED NOT DETECTED Final   Coronavirus NL63 NOT DETECTED NOT DETECTED Final   Coronavirus OC43 NOT DETECTED NOT DETECTED Final   Metapneumovirus NOT DETECTED NOT DETECTED Final   Rhinovirus / Enterovirus NOT DETECTED NOT DETECTED Final   Influenza A NOT DETECTED NOT DETECTED Final   Influenza B NOT DETECTED NOT DETECTED Final   Parainfluenza Virus 1 NOT DETECTED NOT DETECTED Final   Parainfluenza Virus 2 NOT DETECTED NOT DETECTED Final   Parainfluenza Virus 3 NOT DETECTED NOT DETECTED Final   Parainfluenza Virus 4 NOT DETECTED NOT DETECTED Final   Respiratory Syncytial Virus NOT DETECTED NOT DETECTED Final   Bordetella pertussis NOT DETECTED NOT DETECTED Final   Chlamydophila pneumoniae NOT DETECTED NOT DETECTED Final   Mycoplasma pneumoniae NOT DETECTED NOT DETECTED Final    Comment: Performed at [facility]  Urine culture     Status: None   Collection Time: 06/04/16  9:52 AM  Result Value Ref Range Status   Specimen Description URINE, CLEAN CATCH  Final   Special Requests Immunocompromised  Final   Culture NO  GROWTH Performed at [facility]   Final   Report Status 06/05/2016 FINAL  Final  Blood Culture (routine x 2)     Status: None (Preliminary result)   Collection Time: 06/11/16  3:31 PM  Result Value Ref Range Status   Specimen Description BLOOD LEFT ANTECUBITAL  Final   Special Requests IN PEDIATRIC BOTTLE 2.5 CC  Final   Culture   Final    NO GROWTH < 24 HOURS Performed at [facility]    Report Status PENDING  Incomplete  Blood Culture (routine x 2)     Status: None (Preliminary result)   Collection Time: 06/11/16  3:32 PM  Result Value Ref Range Status   Specimen Description BLOOD RIGHT HAND  Final   Special Requests IN PEDIATRIC BOTTLE 3 ML  Final   Culture   Final    NO GROWTH < 24 HOURS Performed at [facility]    Report Status PENDING  Incomplete  Urine culture     Status: Abnormal   Collection Time: 06/11/16  4:21 PM  Result Value Ref Range Status   Specimen Description URINE, CATHETERIZED  Final   Special Requests NONE  Final   Culture (A)  Final    <10,000 COLONIES/mL INSIGNIFICANT GROWTH Performed at [facility]    Report Status 06/12/2016 FINAL  Final  C difficile quick scan w PCR reflex     Status: None   Collection Time: 06/12/16 11:35 AM  Result Value Ref Range Status   C Diff antigen NEGATIVE NEGATIVE Final   C Diff toxin NEGATIVE NEGATIVE Final   C Diff interpretation No C. difficile detected.  Final         Radiology Studies: Dg Chest Port 1 View  Result Date: 06/12/2016 CLINICAL DATA:  Cough. Diabetic. HTN. Hx right lung cancer. Former smoker. EXAM: PORTABLE CHEST 1 VIEW COMPARISON:  06/11/2016 FINDINGS: There is hazy opacity in the lung bases, right greater than left. The hemidiaphragms are mostly obscured. In the mid left lung in the upper right lung are clear. Cardiac silhouette is borderline enlarged. No mediastinal or hilar masses. Skeletal structures are demineralized. IMPRESSION: 1. Right greater than left lower  lung opacity consistent with a combination of pleural fluid with either atelectasis, pneumonia or a combination. Opacity has increased since the previous day's study likely primarily due to enlarged pleural effusions. Electronically Signed   By: Lajean Manes M.D.   On: 06/12/2016 21:14   Dg Chest Port 1 View  Result Date: 06/11/2016 CLINICAL DATA:  Diaphoresis, fever, shortness of breath and cough. Sepsis protocol. History of pneumonia, RIGHT lung cancer. EXAM: PORTABLE CHEST 1 VIEW COMPARISON:  Chest radiograph June 04, 2016 and PET-CT May 03, 2016 FINDINGS: Cardiomediastinal silhouette is normal considering rotation to the LEFT. Mild chronic interstitial changes increased lung volume with small to moderate RIGHT pleural effusion extending to the minor fissure. Patchy RIGHT lower lobe airspace opacity. RIGHT hilar masslike density. Apical pleural thickening. Increased lung volumes. No pneumothorax. Soft tissue planes and included osseous structure nonsuspicious, osteopenia. IMPRESSION: Small to moderate RIGHT pleural effusion with RIGHT lower lobe atelectasis/ consolidation. Prominent RIGHT hilum most representative of lymphadenopathy considering prior chest CT. COPD. Electronically Signed   By: Elon Alas M.D.   On: 06/11/2016 16:13      Scheduled Meds: .  diltiazem  30 mg Oral Q8H  . enoxaparin (LOVENOX) injection  40 mg Subcutaneous Q24H  . feeding supplement (ENSURE ENLIVE)  237 mL Oral BID BM  . guaiFENesin  600 mg Oral BID  . insulin aspart  0-5 Units Subcutaneous QHS  . insulin aspart  0-9 Units Subcutaneous TID WC  . piperacillin-tazobactam (ZOSYN)  IV  3.375 g Intravenous Q8H  . sodium chloride flush  3 mL Intravenous Q12H  . vancomycin  125 mg Oral Q6H  . vancomycin  1,000 mg Intravenous Q12H   Continuous Infusions: . 0.9 % NaCl with KCl 20 mEq / L 75 mL/hr at 06/13/16 0351     LOS: 2 days    Time spent in minutes: 63    [place], MD [facility] Pager: [email or web address] Password [facility] 06/13/2016, 10:43 AM

## 2016-06-13 NOTE — Progress Notes (Signed)
Rn called,  Pt had decreased blood pressure to 80/40's.  I ordered IV fluid bolus of 500.  Pt had increase in blood pressure but decreased to 60/30 after bolus.  Pt given given second bolus. Pt continued to have low blood pressure. RN is concerned pt is aspirating because he has coughing after drinking or medications.  Chest xray shows worsening.  I will make pt npo for now.   Rapid response Rn responded.  Dr. Eulas Post notified and evaluated pt.  PE blood pressure 80/40 heart rate 120's Lungs harsh breath sounds,  Tachycardia.   Pt given 1000 cc bolus of fluids.  Hydrocortisone '100mg'$  given IV.  Albumin ordered.   Pt had improved blood pressure to 100/60.  I spoke with pt's family.  I have advised them that pt has had low blood pressure tonight.   They understand pt's decline tonight and the possibility pt may decline further.  I spoke with them about treatment goals, given declining blood pressure and probable aspiration tonight.  I advised comfort care may be a better option.

## 2016-06-13 NOTE — Progress Notes (Signed)
Chaplain referred to Patient Room by nurse. Mr Wagar is an 82 year old cancer patient who has, until recently, never been in the hospital. He is to be transferred to a local hospice for inpatient care. His wife is not physically able to take care of him and feels the hospice facility would be best for his EOL needs.   Mr Ellis was at first misdiagnosed and the family is very pleased that the staff of [facility] for being an active part in making the correct diagnosis and treating his cancer. The family has been very pleased with the care provide to Mr Belloso.  Mr Dommer is a gentle person, who may be reluctant to "be a burden" on family or staff.  Please page the chaplain should Mr Passey or his family needs or requests further spiritual care. It is recommended that there be follow up visits by spiritual care staff as appropriate.  Sallee Lange. Pasqualino Witherspoon, DMin, MDiv Chaplain

## 2016-06-14 ENCOUNTER — Ambulatory Visit: Payer: Commercial Managed Care - HMO

## 2016-06-14 ENCOUNTER — Encounter: Payer: Self-pay | Admitting: Radiation Oncology

## 2016-06-14 ENCOUNTER — Ambulatory Visit
Admission: RE | Admit: 2016-06-14 | Payer: Commercial Managed Care - HMO | Source: Ambulatory Visit | Admitting: Radiation Oncology

## 2016-06-14 DIAGNOSIS — E872 Acidosis, unspecified: Secondary | ICD-10-CM

## 2016-06-14 DIAGNOSIS — I471 Supraventricular tachycardia: Secondary | ICD-10-CM

## 2016-06-14 LAB — GLUCOSE, CAPILLARY
GLUCOSE-CAPILLARY: 175 mg/dL — AB (ref 65–99)
Glucose-Capillary: 133 mg/dL — ABNORMAL HIGH (ref 65–99)
Glucose-Capillary: 119 mg/dL — ABNORMAL HIGH (ref 65–99)
Glucose-Capillary: 166 mg/dL — ABNORMAL HIGH (ref 65–99)
Glucose-Capillary: 180 mg/dL — ABNORMAL HIGH (ref 65–99)

## 2016-06-14 MED ORDER — SODIUM CHLORIDE 0.9 % IV BOLUS (SEPSIS)
500.0000 mL | Freq: Once | INTRAVENOUS | Status: AC
  Administered 2016-06-14: 500 mL via INTRAVENOUS

## 2016-06-14 MED ORDER — DIPHENOXYLATE-ATROPINE 2.5-0.025 MG PO TABS: 1.0000 | ORAL_TABLET | Freq: Four times a day (QID) | ORAL | Status: DC | PRN

## 2016-06-14 MED ORDER — DIPHENOXYLATE-ATROPINE 2.5-0.025 MG PO TABS
2.0000 | ORAL_TABLET | Freq: Once | ORAL | Status: AC
  Administered 2016-06-14: 2 via ORAL
  Filled 2016-06-14: qty 2

## 2016-06-14 NOTE — Discharge Summary (Signed)
Physician Discharge Summary  Alexander Landry [id number] DOB: [birth year] DOA: 06/11/2016  PCP: [name], MD  Admit date: 06/11/2016 Discharge date: 06/14/2016  Admitted From: rehab  Disposition:  Hospice home   Discharge Condition:  fair   CODE STATUS:  DNR   Diet recommendation:  regular Consultations:  Palliative care    Discharge Diagnoses:  Principal Problem:   SIRS (systemic inflammatory response syndrome) ([place]) Active Problems:   Adenocarcinoma of right lung, stage 4 (HCC)   Protein-calorie malnutrition, severe   Acute respiratory failure with hypoxia (HCC)   SVT (supraventricular tachycardia) (HCC)   Diabetes mellitus type 2 with complications (HCC)   Hypotension   Bone metastases (HCC)   Hypokalemia   Lactic acidosis    Subjective: No complaints. Mumbles when aroused  Brief Summary: 82 y.o.male withrecently diagnosed metastatic bronchogenic adenocarcinoma iwith R hilar mass on 2 L O2 who has been undergoing palliative radiation and has been admitted to the hospital twice since mid Nov for AKI and fevers thought to be due to underlying cancer and a UTI. He has not been eating well and has stopped walking since his first admission. He has had a foley since his last admission. Family mentions that he has been having loose stools and is incontinent He was discharged on 11/28 to [facility] and returns for BP of 82/ 53, temp 101, shortness of breath. Found to have HR 148, RR 28, lactic acid 2.7.  WBC count has been elevated and is unchanged.  On exam, he has mostly been sleeping and will arouse occasionally and eat a few bites. Per family, this is how he has been for weeks.  Started on Vanc/ Zosyn while in hospital and fever and WBC count did improve. However, he declined on 12/2- developed SVT which has not resolved with Metoprolol, Cardizem and Dig. HR still 140-160s and irregular. Subsequently developed low BP and required multiple fluid boluses and a dose  of Albumin. CXR repeated showing significant b/l pleural effusions likely due to third spacing. Through this entire time, the patient continued to be mostly asleep and barely eating or drinking. Discussed hospice with family. They feel that a hospice home is appropriate for him. Palliative care discussion with Dr Rowe Pavy has taken place. Allowing comfort feeds and PRN Morphine in case he aspirates.  Hospital Course:     Principal Problem:   SIRS (systemic inflammatory response syndrome) - ? Pneumonia- chest imaging unchanged regarding a RLL infiltrate and small effusion which has been present al least 2 wks - continued on Vanc/ Zosyn while in hospital and fever and WBC count did improve - has diarrhea- c diff negative  abdomen non-tender- last CT last week was negative for abdominal pathology - UA surprisingly negative despite foley cath  SVT - started on 12/2 and persistent despite aggressive hydration, Metoprolol, Cardizem (which resulted in hypotension resulting in more fluid boluses) - did not improve with subsequent Digoxin either  Diarrhea - non-infectious and persistent will give Lomotil today  Adenocarcinoma of right lung, stage 4   - RLL nodule, LAD, osseos mets noted on PET in 10/23 - mass in pancreatic head noted on CT on 11/24 - palliative radiation- 2 treatments left- has helped his right sided chest and shoulder pain - discussed plan for hospice home with Dr Julien Nordmann who agrees with this  Lethargy - ? Fatigue related to poor nutrition + cancer - limiting narcotics- stopped Remeron  - will go to  hospice home due to extremely poor functional status-  barely eating, gets into chair with 2 person assist    Protein-calorie malnutrition, severe - from what family tells me, I feel his nutrition is too poor to allow him to recuperate from current state-  hospice home is best for him a this point    Hypokalemia/ hypomag - replace  Severe dehydration - unfortunately, giving  IVF has led to anasarca due to his poor nutritional status - d/c IVF  Lactic acidosis - improved to normal but now elevated again due to recurrent hypotension and new tachycardia    Diabetes mellitus type 2 with complications - 82F 6.8 in 05/25/16-    Discharge Instructions     Medication List    STOP taking these medications   ALIGN 4 MG Caps   amLODipine 5 MG tablet Commonly known as:  NORVASC   amoxicillin-clavulanate 875-125 MG tablet Commonly known as:  AUGMENTIN   atenolol 50 MG tablet Commonly known as:  TENORMIN   dronabinol 2.5 MG capsule Commonly known as:  MARINOL   feeding supplement (ENSURE ENLIVE) Liqd   hyaluronate sodium Gel   HYDROcodone-acetaminophen 7.5-325 MG tablet Commonly known as:  NORCO   metFORMIN 500 MG tablet Commonly known as:  GLUCOPHAGE   mirtazapine 15 MG tablet Commonly known as:  REMERON   multivitamin with minerals tablet   omeprazole 20 MG capsule Commonly known as:  PRILOSEC   pioglitazone 30 MG tablet Commonly known as:  ACTOS   potassium chloride 10 MEQ tablet Commonly known as:  K-DUR   simvastatin 20 MG tablet Commonly known as:  ZOCOR   VANCOMYCIN HCL PO     TAKE these medications   acetaminophen 500 MG tablet Commonly known as:  TYLENOL Take 1 tablet (500 mg total) by mouth every 4 (four) hours as needed for fever, headache or mild pain.       No Known Allergies   Procedures/Studies: Dg Chest 2 View  Result Date: 06/01/2016 CLINICAL DATA:  Confusion and weakness. History of stage IV right lower lobe lung adenocarcinoma. EXAM: CHEST  2 VIEW COMPARISON:  05/27/2016 FINDINGS: Chronic lung disease again noted. Right perihilar opacity partly corresponds to known pulmonary mass. There may be a component of adjacent atelectasis or pneumonia. No pulmonary edema or pleural fluid identified. No pneumothorax. The heart size is stable and normal. IMPRESSION: Right perihilar tumor with potential adjacent  atelectasis or pneumonia. Electronically Signed   By: Aletta Edouard M.D.   On: 06/01/2016 16:09   Dg Chest 2 View  Result Date: 05/27/2016 CLINICAL DATA:  Fever with shortness of breath EXAM: CHEST  2 VIEW COMPARISON:  May 10, 2016 chest radiograph and PET-CT May 03, 2016 FINDINGS: Lungs are hyperexpanded. There is airspace consolidation in the right lower lobe in the area of known mass. The mass as a discrete structure known to be present in the right lower lobe is not evident by radiography. The lungs elsewhere are clear. No pneumothorax. Heart size is upper normal with pulmonary vascularity within normal limits. No adenopathy is appreciable by radiography. No bone lesions. IMPRESSION: Right lower lobe patchy infiltrate. Question element of hemorrhage from recent biopsy. There may also be superimposed pneumonia in the right base. The known mass in the right lower lobe region is not seen as a discrete structure by radiography. Lungs elsewhere clear. Stable cardiac silhouette. Electronically Signed   By: Lowella Grip III M.D.   On: 05/27/2016 09:47   Mr Jeri Cos OY Contrast  Result Date: 05/27/2016 CLINICAL DATA:  82  y/o  M; metastatic lung cancer. EXAM: MRI HEAD WITHOUT AND WITH CONTRAST TECHNIQUE: Multiplanar, multiecho pulse sequences of the brain and surrounding structures were obtained without and with intravenous contrast. CONTRAST:  5m MULTIHANCE GADOBENATE DIMEGLUMINE 529 MG/ML IV SOLN COMPARISON:  None. FINDINGS: Brain: No diffusion signal abnormality. Moderate brain parenchymal volume loss. Mild chronic microvascular ischemic changes in periventricular white matter and the pons. Left anterior corona radiata small no abnormal susceptibility hypointensity to indicate intracranial hemorrhage. Severe motion artifact of T1 postcontrast sequences. No gross evidence for abnormal enhancement. Chronic infarct. Vascular: Normal flow voids. Skull and upper cervical spine: Normal marrow  signal. Left temporal scalp 10 mm cyst is probably sebaceous. Sinuses/Orbits: Negative.  Bilateral intra-ocular lens replacement. Other: None. IMPRESSION: 1. Extensive motion artifact of T1 postcontrast sequences. No gross evidence for abnormal enhancement. No signal abnormality on other sequences to suggest intracranial metastatic disease. 2. Moderate brain parenchymal volume loss, mild chronic microvascular ischemic changes, and chronic left anterior corona radiata small lacunar infarct. Electronically Signed   By: LKristine GarbeM.D.   On: 05/27/2016 20:49   Ct Abdomen Pelvis W Contrast  Result Date: 06/04/2016 CLINICAL DATA:  Sepsis. EXAM: CT ABDOMEN AND PELVIS WITH CONTRAST TECHNIQUE: Multidetector CT imaging of the abdomen and pelvis was performed using the standard protocol following bolus administration of intravenous contrast. CONTRAST:  1058mISOVUE-300 IOPAMIDOL (ISOVUE-300) INJECTION 61% COMPARISON:  CT scan of June 28, 2005. FINDINGS: Lower chest: Mild left posterior basilar subsegmental atelectasis is noted. Mild right pleural effusion is noted with adjacent atelectasis or pneumonia. Hepatobiliary: Status post cholecystectomy.  Normal liver. Pancreas: 2.5 x 1.9 cm possible solid mass is noted inferiorly in pancreatic head. Spleen: Normal. Adrenals/Urinary Tract: Adrenal glands appear normal. Moderate bilateral hydroureteronephrosis is noted without definite evidence of obstructing calculus. Mild urinary bladder distention is noted. Stomach/Bowel: There is no evidence of bowel obstruction. Vascular/Lymphatic: Atherosclerosis of abdominal aorta is noted without aneurysm formation. No significant adenopathy is noted. Reproductive: Normal prostate. Other: Mild anasarca is noted. Musculoskeletal: Moderate degenerative disc disease is noted at L5-S1. IMPRESSION: Aortic atherosclerosis. Right lower lobe pneumonia or atelectasis is noted with mild associated pleural effusion. Moderate  bilateral hydroureteronephrosis is noted without definite obstructing calculus. Mild urinary bladder distention is noted and these findings are concerning for bladder outlet obstruction. Mild anasarca. 2.5 cm possible solid mass seen in pancreatic head. MRI is recommended for further evaluation on nonemergent basis. Electronically Signed   By: JaMarijo ConceptionM.D.   On: 06/04/2016 15:42   [place]  Result Date: 05/24/2016 CLINICAL DATA:  Acute kidney injury.  Abnormal labs today. EXAM: RENAL / URINARY TRACT ULTRASOUND COMPLETE COMPARISON:  PET-CT 05/03/2016 FINDINGS: Right Kidney: Length: 11.8 cm. Echogenicity within normal limits. No mass or hydronephrosis visualized. Left Kidney: Length: 12.2 cm. Echogenicity is normal. No hydronephrosis. A simple cyst is identified in the lower pole region measuring 2.9 x 2.6 x 2.3 cm. Bladder: Appears normal for degree of bladder distention. Bilateral ureteral jets are visualized. IMPRESSION: 1. No hydronephrosis. 2. Simple cyst in the lower pole the left kidney. Electronically Signed   By: ElNolon Nations.D.   On: 05/24/2016 18:42   Dg Chest Port 1 View  Result Date: 06/12/2016 CLINICAL DATA:  Cough. Diabetic. HTN. Hx right lung cancer. Former smoker. EXAM: PORTABLE CHEST 1 VIEW COMPARISON:  06/11/2016 FINDINGS: There is hazy opacity in the lung bases, right greater than left. The hemidiaphragms are mostly obscured. In the mid left lung in the upper right lung  are clear. Cardiac silhouette is borderline enlarged. No mediastinal or hilar masses. Skeletal structures are demineralized. IMPRESSION: 1. Right greater than left lower lung opacity consistent with a combination of pleural fluid with either atelectasis, pneumonia or a combination. Opacity has increased since the previous day's study likely primarily due to enlarged pleural effusions. Electronically Signed   By: Lajean Manes M.D.   On: 06/12/2016 21:14   Dg Chest Port 1 View  Result Date:  06/11/2016 CLINICAL DATA:  Diaphoresis, fever, shortness of breath and cough. Sepsis protocol. History of pneumonia, RIGHT lung cancer. EXAM: PORTABLE CHEST 1 VIEW COMPARISON:  Chest radiograph June 04, 2016 and PET-CT May 03, 2016 FINDINGS: Cardiomediastinal silhouette is normal considering rotation to the LEFT. Mild chronic interstitial changes increased lung volume with small to moderate RIGHT pleural effusion extending to the minor fissure. Patchy RIGHT lower lobe airspace opacity. RIGHT hilar masslike density. Apical pleural thickening. Increased lung volumes. No pneumothorax. Soft tissue planes and included osseous structure nonsuspicious, osteopenia. IMPRESSION: Small to moderate RIGHT pleural effusion with RIGHT lower lobe atelectasis/ consolidation. Prominent RIGHT hilum most representative of lymphadenopathy considering prior chest CT. COPD. Electronically Signed   By: Elon Alas M.D.   On: 06/11/2016 16:13   Dg Chest Port 1 View  Result Date: 06/04/2016 CLINICAL DATA:  Fever and sepsis.  Right-sided lung carcinoma EXAM: PORTABLE CHEST 1 VIEW COMPARISON:  June 01, 2016 FINDINGS: Persistent right perihilar prominence is felt to be consistent with previously documented lung mass. There is new interstitial pulmonary edema. There is a new right pleural effusion. There is cardiomegaly with pulmonary venous hypertension. There is atherosclerotic calcification aorta. No adenopathy is seen radiographically. IMPRESSION: Findings indicative of congestive heart failure superimposed on chronic fibrotic type change. Soft tissue fullness in the right perihilar region is stable without progression. There is aortic atherosclerosis. Electronically Signed   By: Lowella Grip III M.D.   On: 06/04/2016 08:24       Discharge Exam: Vitals:   06/14/16 0005 06/14/16 0615  BP:  122/71  Pulse: (!) 139 79  Resp: (!) 24 (!) 24  Temp: (!) 102.5 F (39.2 C) 99 F (37.2 C)   Vitals:   06/13/16  1300 06/13/16 2235 06/14/16 0005 06/14/16 0615  BP: 119/88 135/63  122/71  Pulse: (!) 105 (!) 173 (!) 139 79  Resp: (!) 22 (!) 32 (!) 24 (!) 24  Temp: (!) 100.5 F (38.1 C) (!) 103 F (39.4 C) (!) 102.5 F (39.2 C) 99 F (37.2 C)  TempSrc: Axillary Oral Oral Oral  SpO2: 96% 93% 91% 91%  Weight:      Height:        General: Pt is asleep, awakens only for a few seconds, not in acute distress Cardiovascular: IIRR, tachycardic, S1/S2 +, no rubs, no gallops Respiratory: CTA bilaterally, no wheezing, no rhonchi Abdominal: Soft, NT, ND, bowel sounds + Extremities: no edema, no cyanosis    The results of significant diagnostics from this hospitalization (including imaging, microbiology, ancillary and laboratory) are listed below for reference.     Microbiology: Recent Results (from the past 240 hour(s))  Blood Culture (routine x 2)     Status: None (Preliminary result)   Collection Time: 06/11/16  3:31 PM  Result Value Ref Range Status   Specimen Description BLOOD LEFT ANTECUBITAL  Final   Special Requests IN PEDIATRIC BOTTLE 2.5 CC  Final   Culture   Final    NO GROWTH 2 DAYS Performed at [facility]  Report Status PENDING  Incomplete  Blood Culture (routine x 2)     Status: None (Preliminary result)   Collection Time: 06/11/16  3:32 PM  Result Value Ref Range Status   Specimen Description BLOOD RIGHT HAND  Final   Special Requests IN PEDIATRIC BOTTLE 3 ML  Final   Culture   Final    NO GROWTH 2 DAYS Performed at [facility]    Report Status PENDING  Incomplete  Urine culture     Status: Abnormal   Collection Time: 06/11/16  4:21 PM  Result Value Ref Range Status   Specimen Description URINE, CATHETERIZED  Final   Special Requests NONE  Final   Culture (A)  Final    <10,000 COLONIES/mL INSIGNIFICANT GROWTH Performed at [facility]    Report Status 06/12/2016 FINAL  Final  C difficile quick scan w PCR reflex     Status: None   Collection  Time: 06/12/16 11:35 AM  Result Value Ref Range Status   C Diff antigen NEGATIVE NEGATIVE Final   C Diff toxin NEGATIVE NEGATIVE Final   C Diff interpretation No C. difficile detected.  Final     Labs: BNP (last 3 results) No results for input(s): BNP in the last 8760 hours. Basic Metabolic Panel:  Recent Labs Lab 06/11/16 1531 06/12/16 0519 06/13/16 0518  NA 141 145 144  K 3.2* 3.0* 3.6  CL 103 112* 117*  CO2 25 21* 18*  GLUCOSE 154* 135* 168*  BUN 28* 21* 16  CREATININE 1.19 0.96 0.84  CALCIUM 8.2* 7.3* 8.1*  MG  --  1.4* 2.0  PHOS  --  3.2  --    Liver Function Tests:  Recent Labs Lab 06/11/16 1531 06/12/16 0519  AST 17 17  ALT 23 19  ALKPHOS 88 73  BILITOT 1.1 0.9  PROT 6.7 5.6*  ALBUMIN 2.2* 1.8*   No results for input(s): LIPASE, AMYLASE in the last 168 hours. No results for input(s): AMMONIA in the last 168 hours. CBC:  Recent Labs Lab 06/11/16 1531 06/12/16 0519 06/13/16 0518  WBC 19.3* 17.3* 15.3*  NEUTROABS 14.2*  --   --   HGB 10.9* 9.8* 9.9*  HCT 34.4* 31.0* 31.5*  MCV 99.4 100.0 100.6*  PLT 584* 513* 495*   Cardiac Enzymes: No results for input(s): CKTOTAL, CKMB, CKMBINDEX, TROPONINI in the last 168 hours. BNP: Invalid input(s): POCBNP CBG:  Recent Labs Lab 06/12/16 0802 06/12/16 1202 06/12/16 1729 06/12/16 2149 06/13/16 0811  GLUCAP 133* 119* 175* 166* 180*   D-Dimer No results for input(s): DDIMER in the last 72 hours. Hgb A1c No results for input(s): HGBA1C in the last 72 hours. Lipid Profile No results for input(s): CHOL, HDL, LDLCALC, TRIG, CHOLHDL, LDLDIRECT in the last 72 hours. Thyroid function studies  Recent Labs  06/12/16 0112  TSH 0.799   Anemia work up No results for input(s): VITAMINB12, FOLATE, FERRITIN, TIBC, IRON, RETICCTPCT in the last 72 hours. Urinalysis    Component Value Date/Time   COLORURINE AMBER (A) 06/11/2016 1621   APPEARANCEUR TURBID (A) 06/11/2016 1621   LABSPEC 1.028 06/11/2016 1621    PHURINE 5.0 06/11/2016 1621   GLUCOSEU NEGATIVE 06/11/2016 1621   HGBUR LARGE (A) 06/11/2016 1621   BILIRUBINUR MODERATE (A) 06/11/2016 1621   BILIRUBINUR neg 07/03/2014 [place] 06/11/2016 1621   PROTEINUR 30 (A) 06/11/2016 1621   UROBILINOGEN negative 07/03/2014 1320   NITRITE NEGATIVE 06/11/2016 1621   LEUKOCYTESUR NEGATIVE 06/11/2016 1621  Sepsis Labs Invalid input(s): PROCALCITONIN,  WBC,  LACTICIDVEN Microbiology Recent Results (from the past 240 hour(s))  Blood Culture (routine x 2)     Status: None (Preliminary result)   Collection Time: 06/11/16  3:31 PM  Result Value Ref Range Status   Specimen Description BLOOD LEFT ANTECUBITAL  Final   Special Requests IN PEDIATRIC BOTTLE 2.5 CC  Final   Culture   Final    NO GROWTH 2 DAYS Performed at [facility]    Report Status PENDING  Incomplete  Blood Culture (routine x 2)     Status: None (Preliminary result)   Collection Time: 06/11/16  3:32 PM  Result Value Ref Range Status   Specimen Description BLOOD RIGHT HAND  Final   Special Requests IN PEDIATRIC BOTTLE 3 ML  Final   Culture   Final    NO GROWTH 2 DAYS Performed at [facility]    Report Status PENDING  Incomplete  Urine culture     Status: Abnormal   Collection Time: 06/11/16  4:21 PM  Result Value Ref Range Status   Specimen Description URINE, CATHETERIZED  Final   Special Requests NONE  Final   Culture (A)  Final    <10,000 COLONIES/mL INSIGNIFICANT GROWTH Performed at [facility]    Report Status 06/12/2016 FINAL  Final  C difficile quick scan w PCR reflex     Status: None   Collection Time: 06/12/16 11:35 AM  Result Value Ref Range Status   C Diff antigen NEGATIVE NEGATIVE Final   C Diff toxin NEGATIVE NEGATIVE Final   C Diff interpretation No C. difficile detected.  Final     Time coordinating discharge: Over 30 minutes  SIGNED:   Debbe Odea, MD  [facility] 06/14/2016, 11:48 AM Pager    If 7PM-7AM, please contact night-coverage [email or web address] Password [email or web address]

## 2016-06-14 NOTE — Progress Notes (Signed)
Patient is set to discharge to [facility] today. Patient & family at bedside made aware. Discharge packet given to RN, Robert Bellow. [facility] called for transport.     Raynaldo Opitz, [facility] Clinical Social Worker cell #: [phone]

## 2016-06-14 NOTE — Progress Notes (Signed)
CSW made referral to [facility] at [place] - awaiting call back re: bed availability/eligibility.    Raynaldo Opitz, [facility] Clinical Social Worker cell #: [phone]

## 2016-06-14 NOTE — Progress Notes (Signed)
NP on call made aware of patient Temp and HR, new order given.

## 2016-06-14 NOTE — Progress Notes (Signed)
Report called to [facility] at [facility]. Hospital course and plan of care reviewed.  Callie Fielding RN

## 2016-06-14 NOTE — Progress Notes (Signed)
Pharmacy Antibiotic Note  Alexander Landry is a 82 y.o. male admitted on 06/11/2016 with sepsis.  Pharmacy has been consulted for vancomycin and Zosyn dosing.  Plan:  Continue Vancomycin 1000 mg IV q12 hr;   Check trough tonight before 18:00 dose; goal trough 15-20 mcg/mL  Continue Zosyn 3.375gm IV q8h (4hr extended infusions) Follow clinical course, renal function, culture results as available Follow for de-escalation of antibiotics and LOT  Height: '6\' 4"'$  (193 cm) Weight: 200 lb 9.9 oz (91 kg) IBW/kg (Calculated) : 86.8  Temp (24hrs), Avg:101.3 F (38.5 C), Min:99 F (37.2 C), Max:103 F (39.4 C)   Recent Labs Lab 06/11/16 1531 06/11/16 1546 06/11/16 1811 06/12/16 0112 06/12/16 0519 06/13/16 0518  WBC 19.3*  --   --   --  17.3* 15.3*  CREATININE 1.19  --   --   --  0.96 0.84  LATICACIDVEN  --  2.18* 2.33* 2.7* 1.4  --     Estimated Creatinine Clearance: 84.7 mL/min (by C-G formula based on SCr of 0.84 mg/dL).    No Known Allergies  Antimicrobials this admission: Vancomycin 12/1 >>  Zosyn 12/1 >>   Dose adjustments this admission: 12/4 vanc trough at 17:30 = ___ on 1g q12h  Microbiology results: Recent UCx 11/16 with > 100k pansens E coli 12/1 BCx: ngtd 12/1 UCx: >10k insignificant growth 12/2 C.diff neg  Thank you for allowing pharmacy to be a part of this patient's care.  Peggyann Juba, PharmD, BCPS Pager: [phone] 06/14/2016, 9:28 AM

## 2016-06-15 ENCOUNTER — Other Ambulatory Visit: Payer: Commercial Managed Care - HMO

## 2016-06-15 ENCOUNTER — Ambulatory Visit: Payer: Commercial Managed Care - HMO

## 2016-06-15 ENCOUNTER — Ambulatory Visit: Payer: Commercial Managed Care - HMO | Admitting: Internal Medicine

## 2016-06-17 ENCOUNTER — Telehealth: Payer: Self-pay | Admitting: *Deleted

## 2016-06-17 LAB — CULTURE, BLOOD (ROUTINE X 2)
Culture: NO GROWTH
Culture: NO GROWTH

## 2016-06-17 NOTE — Telephone Encounter (Signed)
Received call from [place], [place] @ [facility] re:  Pt passed away yesterday  07-09-2016 at  325 pm. [facility]    Phone      [phone].

## 2016-06-21 ENCOUNTER — Telehealth: Payer: Self-pay | Admitting: Family Medicine

## 2016-06-21 NOTE — Telephone Encounter (Signed)
Sympathy card sent

## 2016-06-24 NOTE — Progress Notes (Signed)
°  Radiation Oncology         [phone] ________________________________  Name: HENSLEY TREAT MRN: [id number]  Date: 06/14/2016  DOB: 05-23-1933  End of Treatment Note  Diagnosis:   Stage IV, T1b, N1, M1b, NSCLC, adenocarcinoma of the right lower lobe of the lung with metastatic disease to the sternum, clavicle, and thoracic spine.    ICD-9-CM ICD-10-CM   1. Bone metastases ([place]) 198.5 C79.51        Indication for treatment:  Curative       Radiation treatment dates:   05/31/2016 to 06/11/2016  Site/dose:    1. The Left shoulder was treated to 28 Gy in 8 fractions at 3.5 Gy per fraction. 2. The Right shoulder was treated to 28 Gy in 8 fractions at 3.5 Gy per fraction.  3. The sternum was treated to 24.5 Gy in 7 fractions at 3.5 Gy per fraction out of a planned dose of 28 Gy total.   Beams/energy:    1. 3D // 15X 2. 3D // 15X, 6X 3. En face // 12 MeV  Narrative: The patient tolerated radiation treatment but his overall performance status was poor. He discontinued treatment after being hospitalized with sepsis due to pneumonia.   Plan: The patient has completed radiation treatment. The patient passed away on 06/26/2016.  I advised them to call if they have any questions or concerns related to their treatment.  ------------------------------------------------  Jodelle Gross, MD, PhD  This document serves as a record of services personally performed by Kyung Rudd, MD. It was created on his behalf by Arlyce Harman, a trained medical scribe. The creation of this record is based on the scribe's personal observations and the provider's statements to them. This document has been checked and approved by the attending provider.

## 2016-06-25 ENCOUNTER — Encounter (HOSPITAL_COMMUNITY): Payer: Self-pay

## 2016-07-12 DEATH — deceased

## 2016-08-03 ENCOUNTER — Ambulatory Visit: Payer: Self-pay | Admitting: Radiation Oncology

## 2017-10-28 IMAGING — MR MR HEAD WO/W CM
11 of 12 series · 34 of 48 positions shown · IV contrast (multihance)
Comparison: None.

CLINICAL DATA: 83 y/o  M; metastatic lung cancer.

EXAM:
MRI HEAD WITHOUT AND WITH CONTRAST
TECHNIQUE: Multiplanar, multiecho pulse sequences of the brain and surrounding
structures were obtained without and with intravenous contrast.
CONTRAST:  18mL MULTIHANCE GADOBENATE DIMEGLUMINE 529 MG/ML IV SOLN

[Series 4: DWI · axial · 3.0mm · 1.02mm/px · z∈[-177,-12]mm · 8 of 118 slices shown (1 of 2)]
[im 1/118]
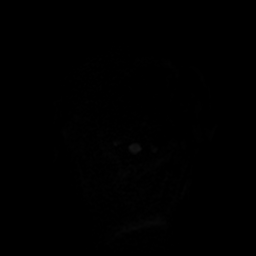
[im 14/118]
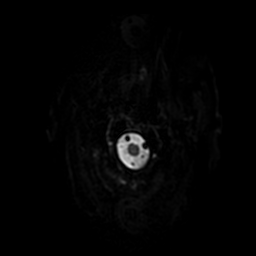
[im 40/118]
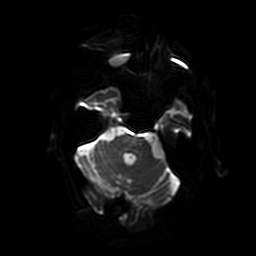
[im 53/118]
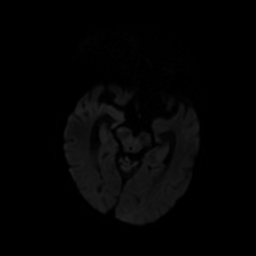
[im 66/118]
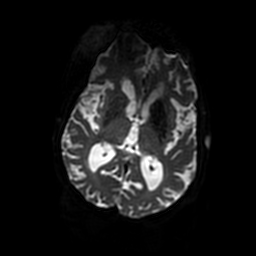
[im 79/118]
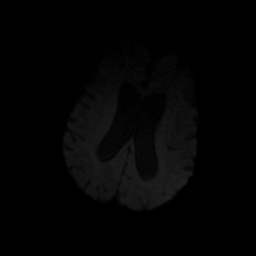
[im 105/118]
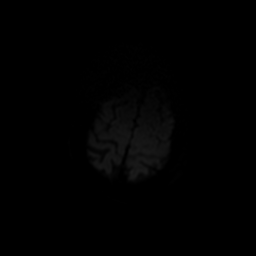
[im 118/118]
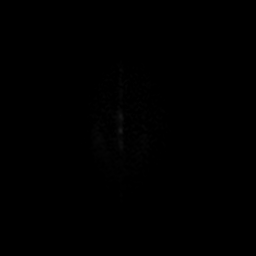

[Series 5: DWI · coronal · 5.0mm · 1.02mm/px · 6 of 72 slices shown (2 of 2)]
[im 1/72]
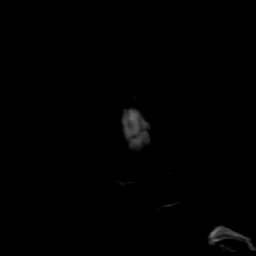
[im 15/72]
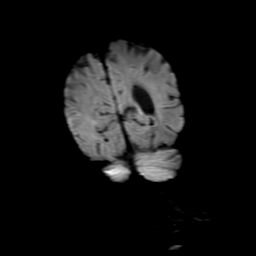
[im 29/72]
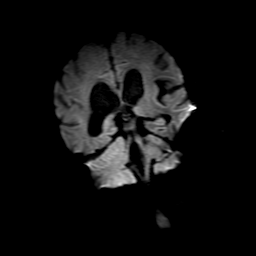
[im 43/72]
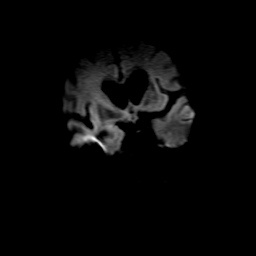
[im 57/72]
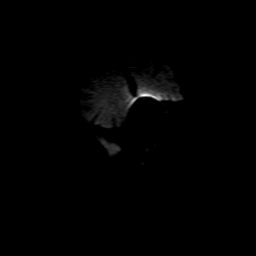
[im 72/72]
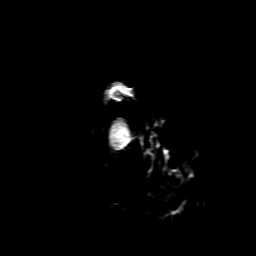

[Series 6: FLAIR · sagittal · 5.0mm · 0.47mm/px · 2 of 24 slices shown (1 of 2)]
[im 1/24]
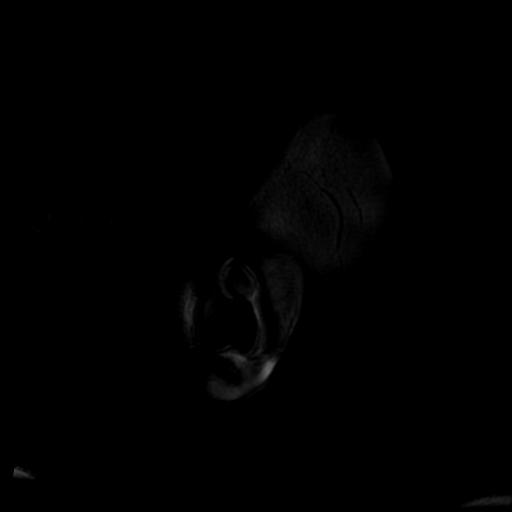
[im 24/24]
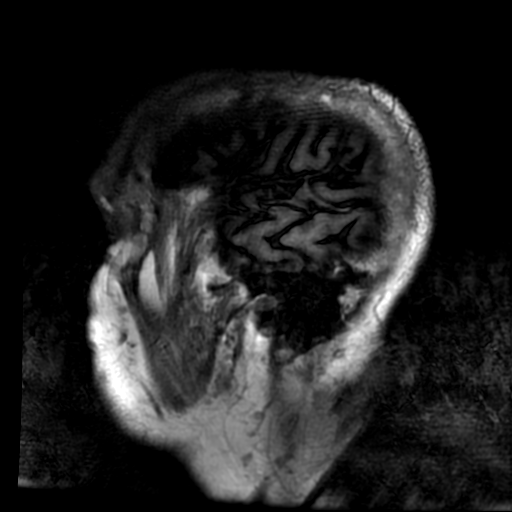

[Series 7: T2 · axial · 5.0mm · 0.47mm/px · z∈[-178,-13]mm · 2 of 30 slices shown (1 of 2)]
[im 1/30]
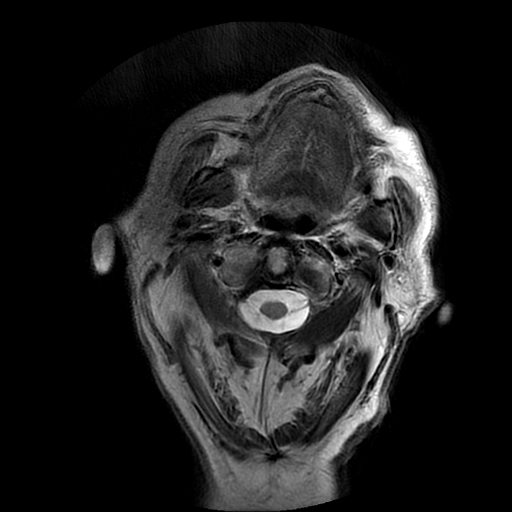
[im 30/30]
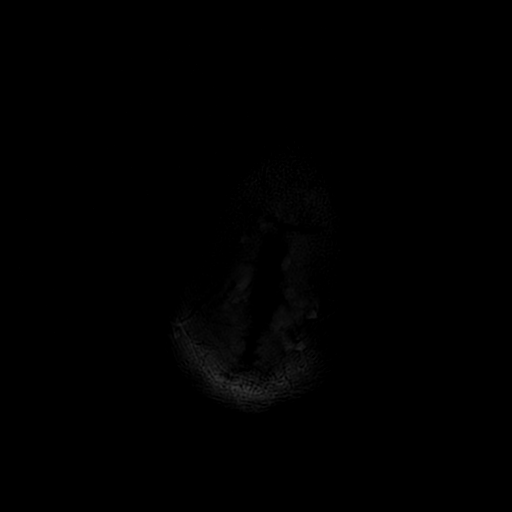

[Series 8: FLAIR · axial · 5.0mm · 0.47mm/px · z∈[-178,-13]mm · 2 of 30 slices shown (2 of 2)]
[im 1/30]
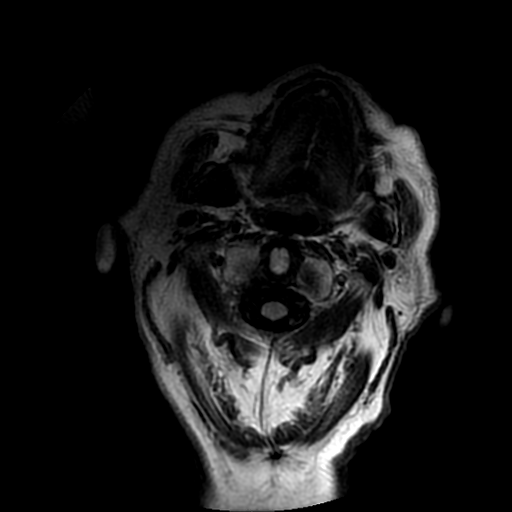
[im 30/30]
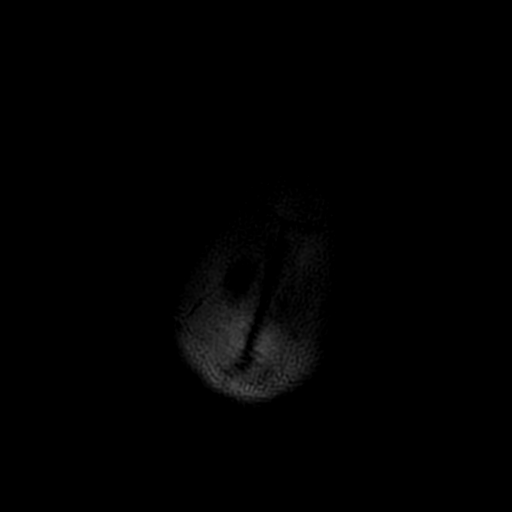

[Series 9: (person_name) · axial · 3.0mm · 0.47mm/px · 1 of 120 slices shown]
[im 1/120]
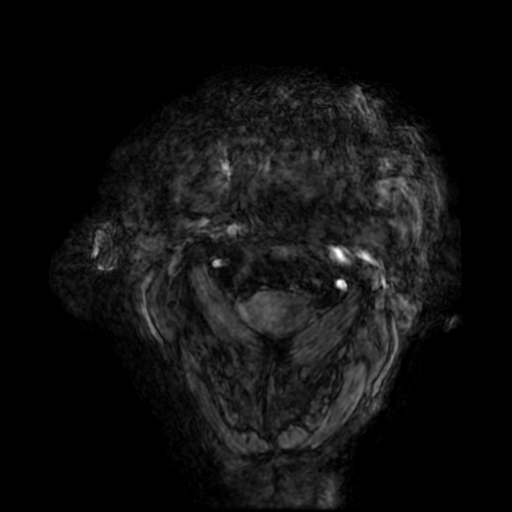

[Series 11: T2 · coronal · 5.0mm · 0.47mm/px · 2 of 30 slices shown (2 of 2)]
[im 1/30]
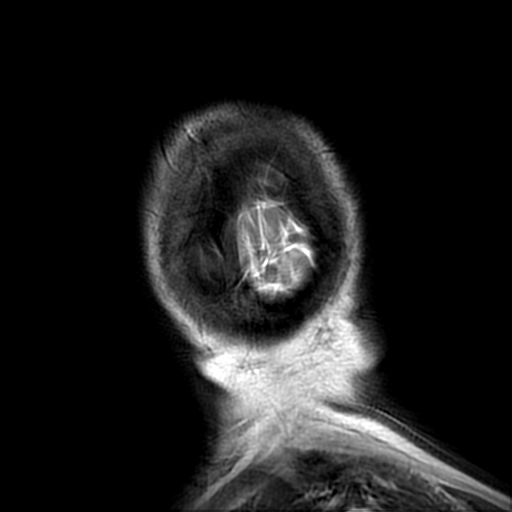
[im 30/30]
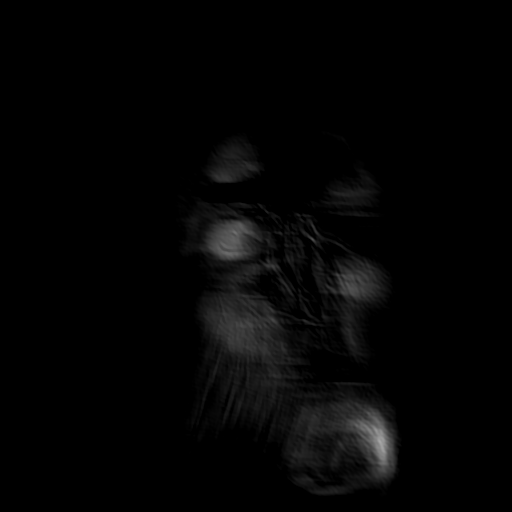

[Series 12: T1 · axial · 5.0mm · 0.47mm/px · z∈[-178,-13]mm · 2 of 30 slices shown (1 of 2)]
[im 1/30]
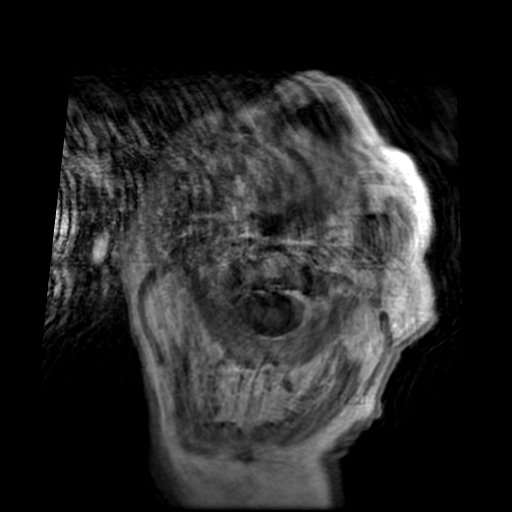
[im 30/30]
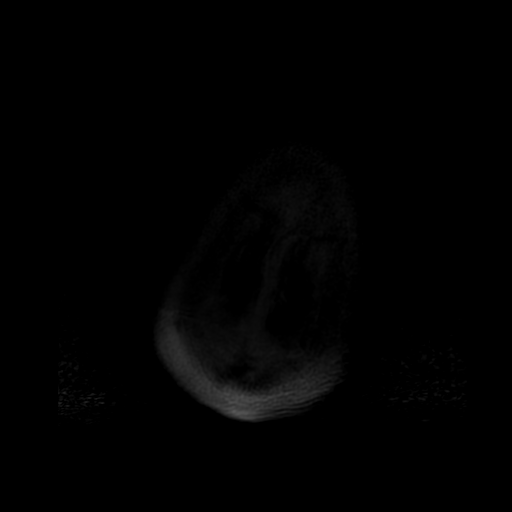

[Series 13: T1 · coronal · 5.0mm · 0.47mm/px · 2 of 30 slices shown (2 of 2)]
[im 1/30]
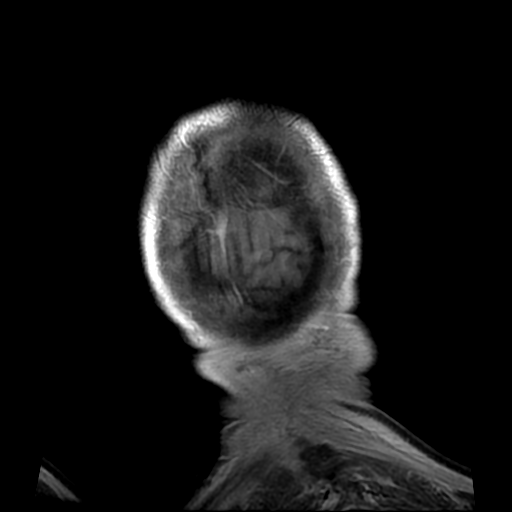
[im 30/30]
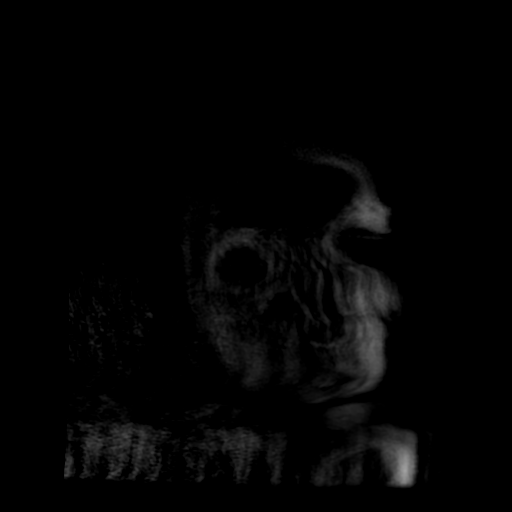

[Series 450: ADC · axial · 3.0mm · 1.02mm/px · z∈[-177,-12]mm · 4 of 59 slices shown (1 of 2)]
[im 1/59]
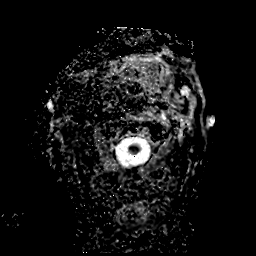
[im 20/59]
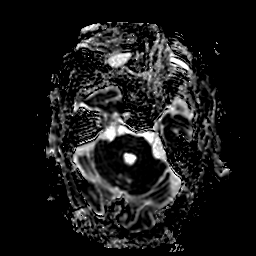
[im 39/59]
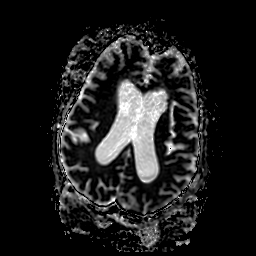
[im 59/59]
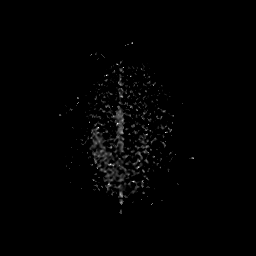

[Series 550: ADC · coronal · 5.0mm · 1.02mm/px · 3 of 36 slices shown (2 of 2)]
[im 1/36]
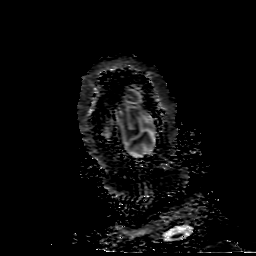
[im 18/36]
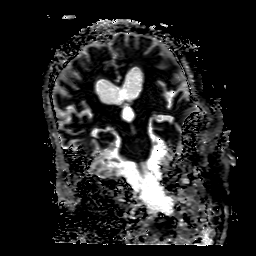
[im 36/36]
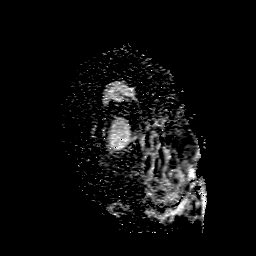

[34 of 48 positions shown; findings below may reference images not displayed]

FINDINGS: Brain: No diffusion signal abnormality. Moderate brain parenchymal
volume loss. Mild chronic microvascular ischemic changes in
periventricular white matter and the pons. Left anterior corona
radiata small no abnormal susceptibility hypointensity to indicate
intracranial hemorrhage. Severe motion artifact of T1 postcontrast
sequences. No gross evidence for abnormal enhancement. Chronic
infarct.

Vascular: Normal flow voids.

Skull and upper cervical spine: Normal marrow signal. Left temporal
scalp 10 mm cyst is probably sebaceous.

Sinuses/Orbits: Negative.  Bilateral intra-ocular lens replacement.

Other: None.
IMPRESSION: 1. Extensive motion artifact of T1 postcontrast sequences. No gross
evidence for abnormal enhancement. No signal abnormality on other
sequences to suggest intracranial metastatic disease.
2. Moderate brain parenchymal volume loss, mild chronic
microvascular ischemic changes, and chronic left anterior corona
radiata small lacunar infarct.

By: Reese Tiger M.D.

## 2017-11-02 IMAGING — CR DG CHEST 2V
2 series · 2 of 2 positions shown · non-contrast
Comparison: 05/27/2016

CLINICAL DATA: Confusion and weakness. History of stage IV right
lower lobe lung adenocarcinoma.

EXAM:
CHEST  2 VIEW

[x chest ap]
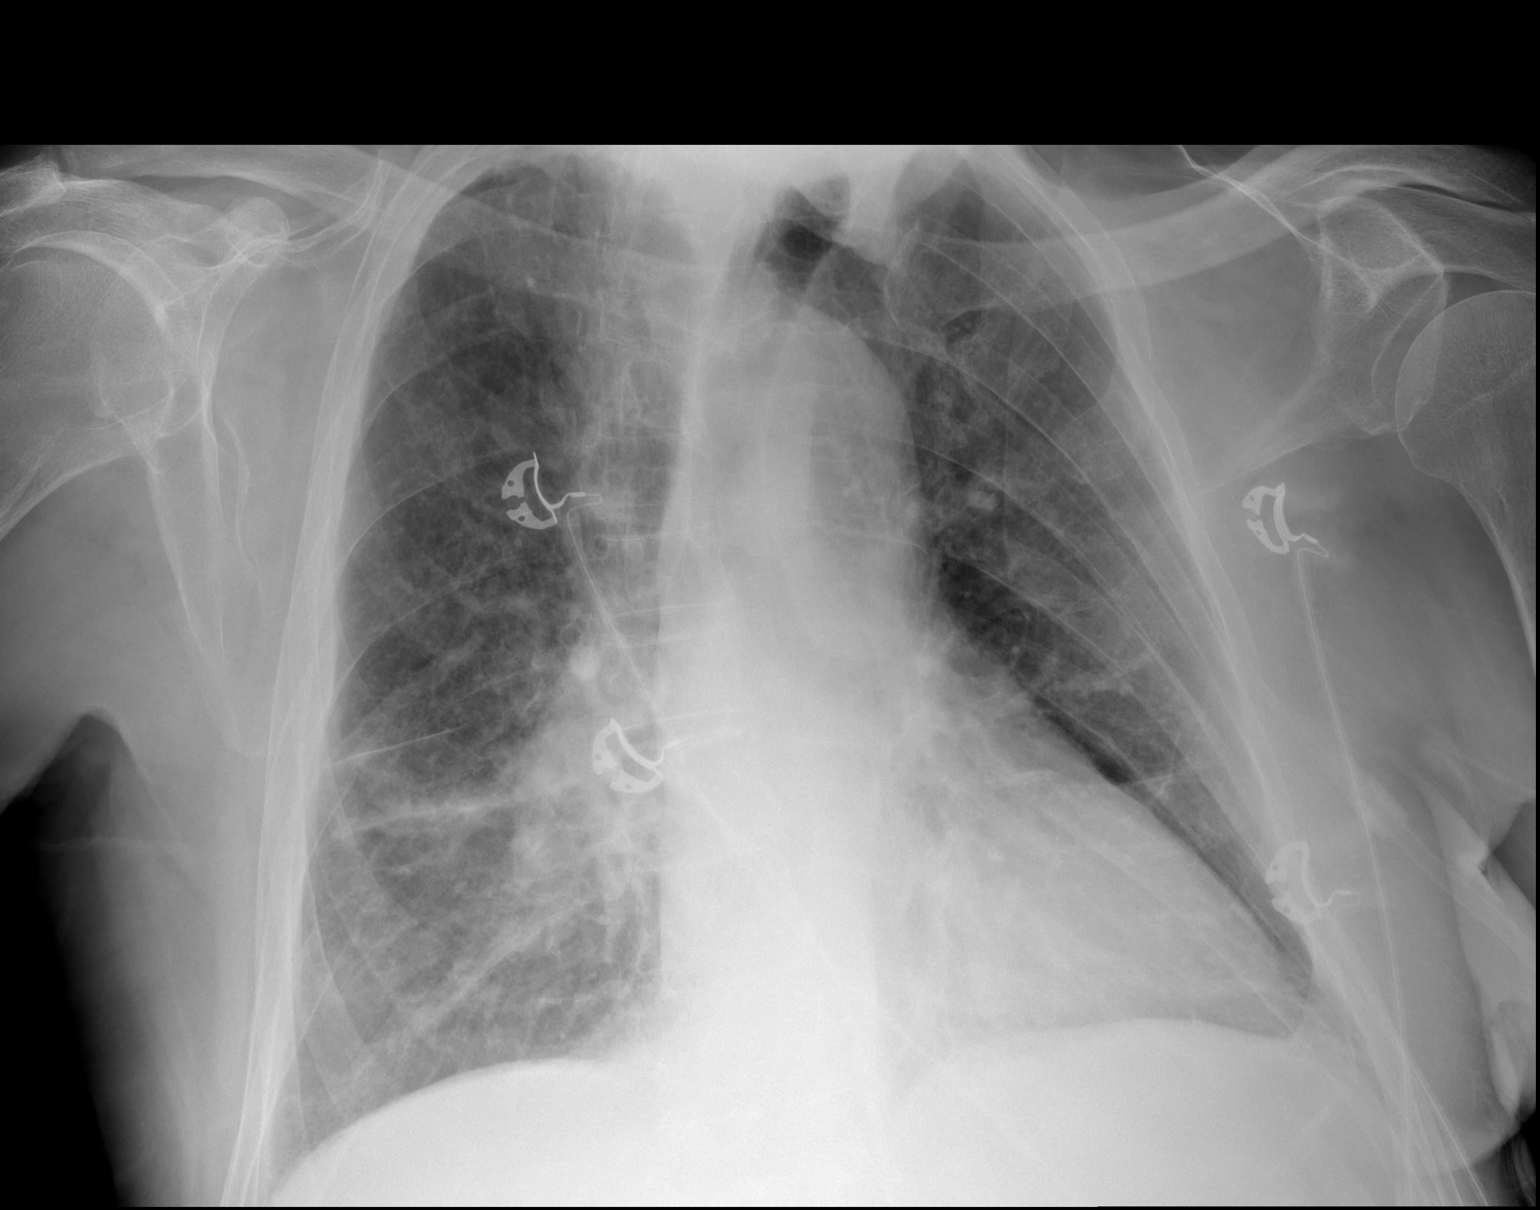

[w chest lat]
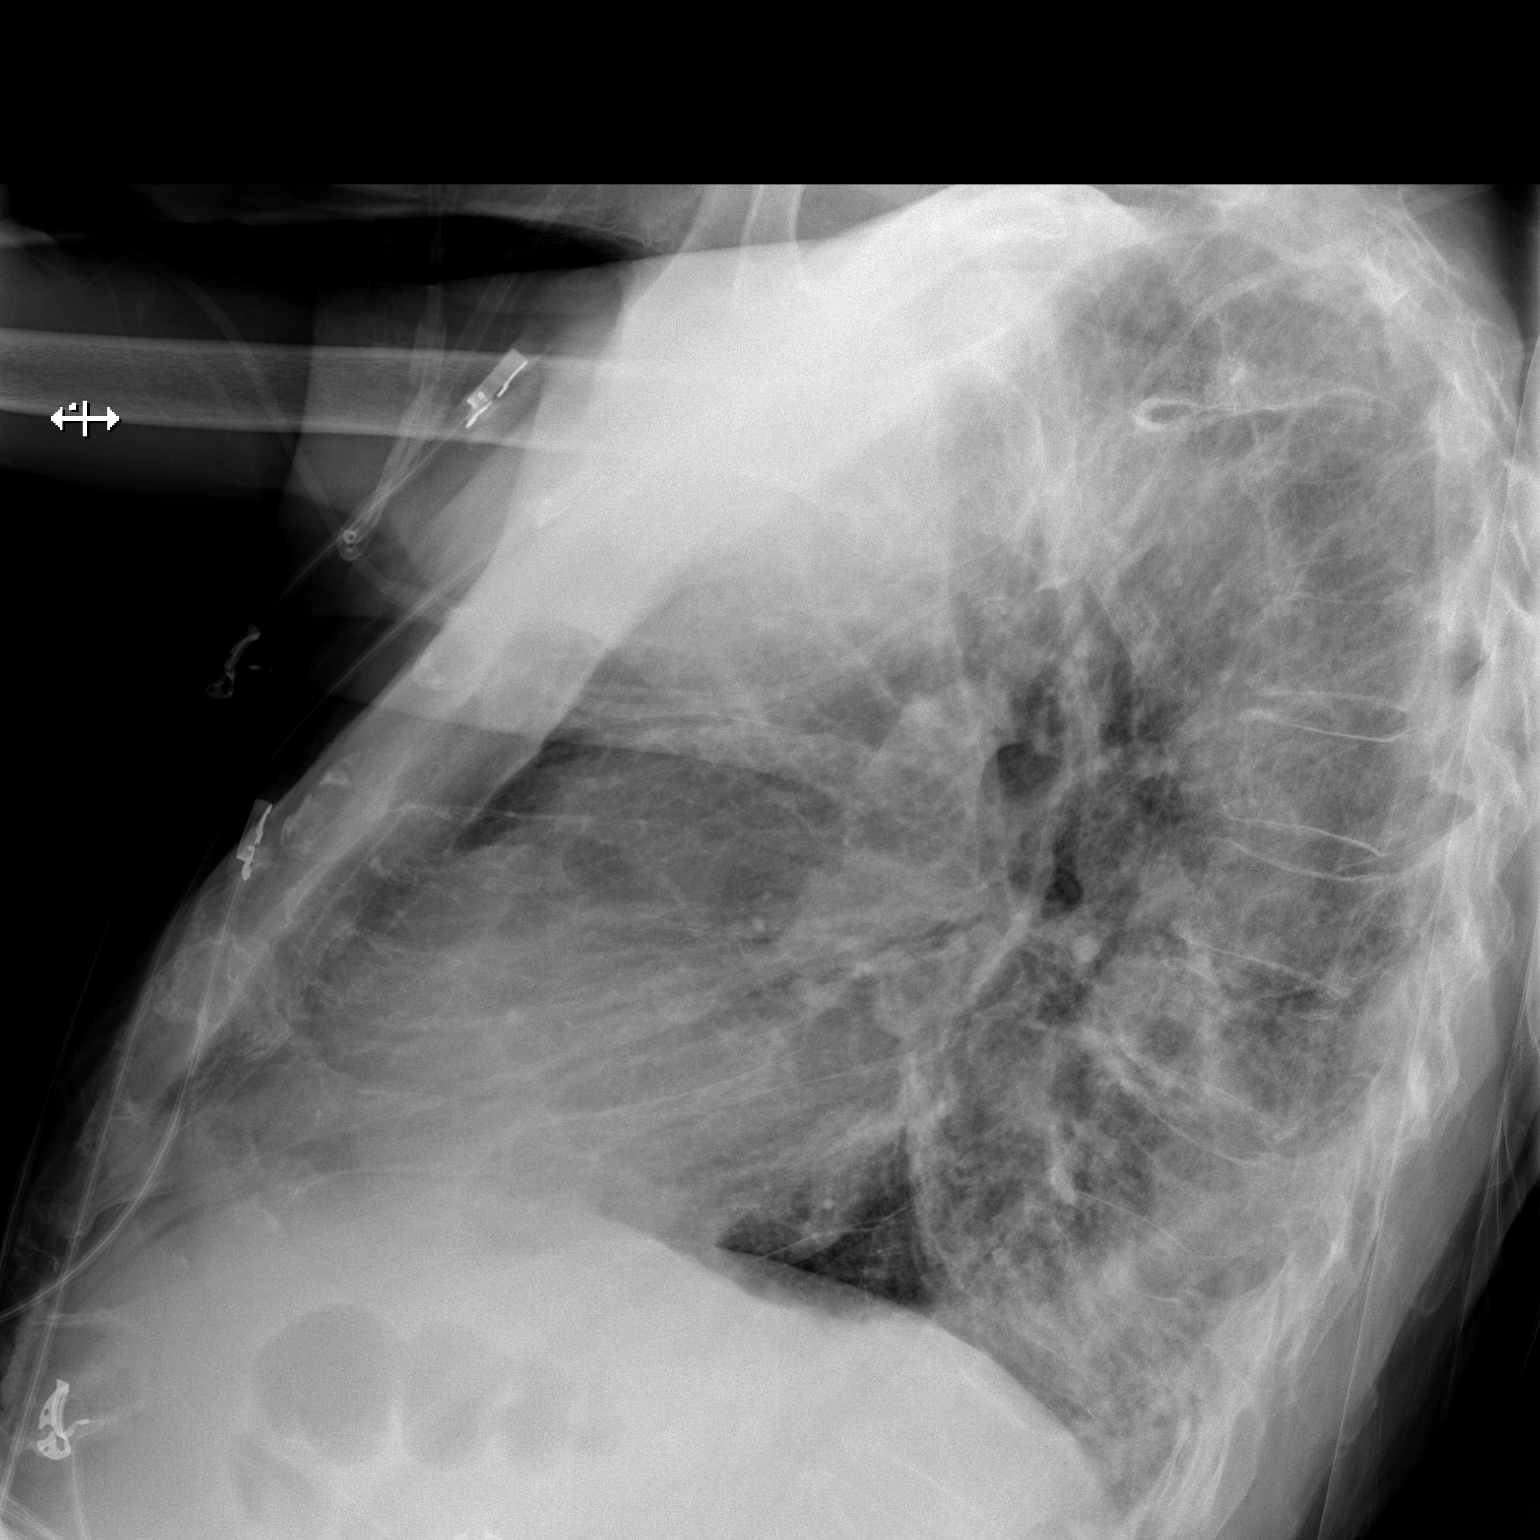

[2 of 2 positions shown; findings below may reference images not displayed]

FINDINGS: Chronic lung disease again noted. Right perihilar opacity partly
corresponds to known pulmonary mass. There may be a component of
adjacent atelectasis or pneumonia. No pulmonary edema or pleural
fluid identified. No pneumothorax. The heart size is stable and
normal.
IMPRESSION: Right perihilar tumor with potential adjacent atelectasis or
pneumonia.
